# Patient Record
Sex: Male | Born: 1965 | Race: White | Hispanic: No | Marital: Single | Smoking: Never smoker
Health system: Southern US, Community
[De-identification: ages and names within clinical notes are randomized; demographics above are authoritative.]

## PROBLEM LIST (undated history)

## (undated) DIAGNOSIS — K219 Gastro-esophageal reflux disease without esophagitis: Secondary | ICD-10-CM

## (undated) DIAGNOSIS — F329 Major depressive disorder, single episode, unspecified: Secondary | ICD-10-CM

## (undated) DIAGNOSIS — R61 Generalized hyperhidrosis: Secondary | ICD-10-CM

## (undated) DIAGNOSIS — F32A Depression, unspecified: Secondary | ICD-10-CM

## (undated) DIAGNOSIS — F419 Anxiety disorder, unspecified: Secondary | ICD-10-CM

## (undated) DIAGNOSIS — F3181 Bipolar II disorder: Secondary | ICD-10-CM

## (undated) DIAGNOSIS — G20C Parkinsonism, unspecified: Secondary | ICD-10-CM

## (undated) HISTORY — PX: HERNIA REPAIR: SHX51

## (undated) HISTORY — PX: HIP SURGERY: SHX245

## (undated) HISTORY — DX: Parkinsonism, unspecified: G20.C

---

## 2015-11-05 ENCOUNTER — Emergency Department (HOSPITAL_COMMUNITY)
Admission: EM | Admit: 2015-11-05 | Discharge: 2015-11-07 | Disposition: A | Discharge status: Other Acute Inpatient Hospital | Payer: BLUE CROSS/BLUE SHIELD | Attending: Emergency Medicine | Admitting: Emergency Medicine

## 2015-11-05 ENCOUNTER — Encounter (HOSPITAL_COMMUNITY): Payer: Self-pay

## 2015-11-05 DIAGNOSIS — R739 Hyperglycemia, unspecified: Secondary | ICD-10-CM | POA: Diagnosis not present

## 2015-11-05 DIAGNOSIS — R4689 Other symptoms and signs involving appearance and behavior: Secondary | ICD-10-CM

## 2015-11-05 DIAGNOSIS — F332 Major depressive disorder, recurrent severe without psychotic features: Secondary | ICD-10-CM | POA: Diagnosis not present

## 2015-11-05 DIAGNOSIS — R460 Very low level of personal hygiene: Secondary | ICD-10-CM

## 2015-11-05 DIAGNOSIS — F411 Generalized anxiety disorder: Secondary | ICD-10-CM | POA: Diagnosis not present

## 2015-11-05 DIAGNOSIS — Z79899 Other long term (current) drug therapy: Secondary | ICD-10-CM | POA: Insufficient documentation

## 2015-11-05 DIAGNOSIS — R45851 Suicidal ideations: Secondary | ICD-10-CM | POA: Diagnosis not present

## 2015-11-05 DIAGNOSIS — T1491 Suicide attempt: Secondary | ICD-10-CM | POA: Diagnosis present

## 2015-11-05 DIAGNOSIS — Z046 Encounter for general psychiatric examination, requested by authority: Secondary | ICD-10-CM

## 2015-11-05 DIAGNOSIS — E876 Hypokalemia: Secondary | ICD-10-CM | POA: Insufficient documentation

## 2015-11-05 DIAGNOSIS — R4589 Other symptoms and signs involving emotional state: Secondary | ICD-10-CM

## 2015-11-05 HISTORY — DX: Major depressive disorder, single episode, unspecified: F32.9

## 2015-11-05 HISTORY — DX: Depression, unspecified: F32.A

## 2015-11-05 HISTORY — DX: Generalized hyperhidrosis: R61

## 2015-11-05 LAB — CBC
HCT: 44.3 % (ref 39.0–52.0)
Hemoglobin: 15.7 g/dL (ref 13.0–17.0)
MCH: 33 pg (ref 26.0–34.0)
MCHC: 35.4 g/dL (ref 30.0–36.0)
MCV: 93.1 fL (ref 78.0–100.0)
PLATELETS: 317 10*3/uL (ref 150–400)
RBC: 4.76 MIL/uL (ref 4.22–5.81)
RDW: 12.1 % (ref 11.5–15.5)
WBC: 4.6 10*3/uL (ref 4.0–10.5)

## 2015-11-05 LAB — COMPREHENSIVE METABOLIC PANEL
ALK PHOS: 78 U/L (ref 38–126)
ALT: 33 U/L (ref 17–63)
AST: 40 U/L (ref 15–41)
Albumin: 5.4 g/dL — ABNORMAL HIGH (ref 3.5–5.0)
Anion gap: 10 (ref 5–15)
BILIRUBIN TOTAL: 0.8 mg/dL (ref 0.3–1.2)
BUN: 29 mg/dL — AB (ref 6–20)
CALCIUM: 10.2 mg/dL (ref 8.9–10.3)
CHLORIDE: 100 mmol/L — AB (ref 101–111)
CO2: 28 mmol/L (ref 22–32)
CREATININE: 1.15 mg/dL (ref 0.61–1.24)
GFR calc Af Amer: >60 mL/min (ref >60)
Glucose, Bld: 142 mg/dL — ABNORMAL HIGH (ref 65–99)
Potassium: 3.3 mmol/L — ABNORMAL LOW (ref 3.5–5.1)
Sodium: 138 mmol/L (ref 135–145)
TOTAL PROTEIN: 8.1 g/dL (ref 6.5–8.1)

## 2015-11-05 LAB — ACETAMINOPHEN LEVEL: Acetaminophen (Tylenol), Serum: <10 ug/mL — ABNORMAL LOW (ref 10–30)

## 2015-11-05 LAB — ETHANOL

## 2015-11-05 LAB — SALICYLATE LEVEL: Salicylate Lvl: <4 mg/dL (ref 2.8–30.0)

## 2015-11-05 LAB — RAPID URINE DRUG SCREEN, HOSP PERFORMED
AMPHETAMINES: NOT DETECTED
Barbiturates: NOT DETECTED
Benzodiazepines: NOT DETECTED
Cocaine: NOT DETECTED
Opiates: NOT DETECTED
TETRAHYDROCANNABINOL: NOT DETECTED

## 2015-11-05 MED ORDER — LORAZEPAM 1 MG PO TABS: 1.0000 mg | ORAL_TABLET | Freq: Three times a day (TID) | ORAL | Status: DC | PRN

## 2015-11-05 MED ORDER — ADULT MULTIVITAMIN W/MINERALS CH
1.0000 | ORAL_TABLET | Freq: Every day | ORAL | Status: DC
  Administered 2015-11-05 – 2015-11-07 (×3): 1 via ORAL
  Filled 2015-11-05 (×3): qty 1

## 2015-11-05 MED ORDER — FAMOTIDINE 20 MG PO TABS
20.0000 mg | ORAL_TABLET | Freq: Every day | ORAL | Status: DC
  Administered 2015-11-05 – 2015-11-07 (×3): 20 mg via ORAL
  Filled 2015-11-05 (×3): qty 1

## 2015-11-05 MED ORDER — ALUM & MAG HYDROXIDE-SIMETH 200-200-20 MG/5ML PO SUSP: 30.0000 mL | ORAL | Status: DC | PRN

## 2015-11-05 MED ORDER — ONDANSETRON HCL 4 MG PO TABS
4.0000 mg | ORAL_TABLET | Freq: Three times a day (TID) | ORAL | Status: DC | PRN
Start: 2015-11-05 — End: 2015-11-07

## 2015-11-05 MED ORDER — IBUPROFEN 200 MG PO TABS: 600.0000 mg | ORAL_TABLET | Freq: Three times a day (TID) | ORAL | Status: DC | PRN

## 2015-11-05 MED ORDER — PANTOPRAZOLE SODIUM 40 MG PO TBEC
40.0000 mg | DELAYED_RELEASE_TABLET | Freq: Every day | ORAL | Status: DC
  Administered 2015-11-05 – 2015-11-07 (×3): 40 mg via ORAL
  Filled 2015-11-05 (×3): qty 1

## 2015-11-05 MED ORDER — ACETAMINOPHEN 325 MG PO TABS: 650.0000 mg | ORAL_TABLET | ORAL | Status: DC | PRN

## 2015-11-05 MED ORDER — POTASSIUM CHLORIDE CRYS ER 20 MEQ PO TBCR
20.0000 meq | EXTENDED_RELEASE_TABLET | Freq: Once | ORAL | Status: AC
  Administered 2015-11-05: 20 meq via ORAL
  Filled 2015-11-05: qty 1

## 2015-11-05 MED ORDER — VITAMIN D3 25 MCG (1000 UNIT) PO TABS
5000.0000 [IU] | ORAL_TABLET | Freq: Every day | ORAL | Status: DC
  Administered 2015-11-06 – 2015-11-07 (×2): 5000 [IU] via ORAL
  Filled 2015-11-05 (×2): qty 5

## 2015-11-05 MED ORDER — ZOLPIDEM TARTRATE 5 MG PO TABS: 5.0000 mg | ORAL_TABLET | Freq: Every evening | ORAL | Status: DC | PRN

## 2015-11-05 NOTE — ED Notes (Signed)
Patient stated that he did say to his mother that he did threaten to kill his mother and himself, but he did not mean it. Patient stated that his mother said "sometimes you make me so damn mad and I feel like killing you." Patient states his mother and his brother are trying to make it look like he is crazy, but he is not. Patient states when his family makes him mad he does "blow up." Patient stated that he told his mother that ifs he did not hang the phone up when she was dialing 911 that he would kill her. Patient states he said that to scare her. Patient does confirm that he has not left his house in a year. Poor hygiene.

## 2015-11-05 NOTE — ED Notes (Signed)
Patient placed in scrubs, had on blue pattern pj bottoms, navy under wear, gray socks, peach tshirt and bedroom slippers. Items placed in belongings bag and placed behind nursing station. Security wanded patient. He has a black wallet and security has it.

## 2015-11-05 NOTE — ED Notes (Signed)
Pt AAO x 3, no distress noted, calm & cooperative.  Monitoring for safety, Q 15 min checks in effect. 
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
## 2015-11-05 NOTE — ED Notes (Signed)
Key placed in IVC folder, locker 8
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
## 2015-11-06 DIAGNOSIS — F411 Generalized anxiety disorder: Secondary | ICD-10-CM | POA: Diagnosis present

## 2015-11-06 DIAGNOSIS — F332 Major depressive disorder, recurrent severe without psychotic features: Secondary | ICD-10-CM | POA: Diagnosis not present

## 2015-11-06 DIAGNOSIS — R45851 Suicidal ideations: Secondary | ICD-10-CM

## 2015-11-06 MED ORDER — FLUOXETINE HCL 10 MG PO CAPS
10.0000 mg | ORAL_CAPSULE | Freq: Every day | ORAL | Status: DC
  Administered 2015-11-06 – 2015-11-07 (×2): 10 mg via ORAL
  Filled 2015-11-06 (×2): qty 1

## 2015-11-06 MED ORDER — GABAPENTIN 300 MG PO CAPS
300.0000 mg | ORAL_CAPSULE | Freq: Two times a day (BID) | ORAL | Status: DC
  Administered 2015-11-06 – 2015-11-07 (×3): 300 mg via ORAL
  Filled 2015-11-06 (×3): qty 1

## 2015-11-06 MED ORDER — HYDROXYZINE HCL 25 MG PO TABS
25.0000 mg | ORAL_TABLET | Freq: Four times a day (QID) | ORAL | Status: DC | PRN
  Administered 2015-11-06: 25 mg via ORAL
  Filled 2015-11-06: qty 1
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
## 2015-11-06 NOTE — Progress Notes (Signed)
Pt confirmed with ED Cm that his pcp is Gary [name] EPIC updated Pt pleasant and talkative Expressed how much he likes his pcp

## 2015-11-06 NOTE — Progress Notes (Signed)
Per Maris Bergerarol, [facility] accepts patient under Dr. Shawnie DapperLopez 11/07/2015 after 0830. Call report when patient is leaving. [phone]. [place] with TTS made aware.
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
## 2015-11-06 NOTE — ED Notes (Signed)
Patient appears cooperative, mildly anxious. Denies SI, HI, AVH. Reports trouble sleeping at night since being hospitalized. Denies any feelings of anxiety or depression.  Encouragement offered. Snack provided.  Q 15 safety checks continue.
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
## 2015-11-06 NOTE — BH Assessment (Addendum)
[facility] Assessment Progress Note  At the request of Thedore MinsMojeed Akintayo, MD, this writer called pt's mother, Gary [name] [phone]), to discuss pt returning to her household.  Call was placed at 09:56.  Call rolled to voice mail and I left a message.  Awaiting call back.  Gary Canninghomas Lexine Jaspers, MA Triage Specialist [phone]   Addendum:  Second call placed at 10:29, again rolling to voice mail.  Gary Canninghomas Jackye Dever, MA Triage Specialist [phone]   Addendum:  Notes above have been staffed with Dr Jannifer FranklinAkintayo, MD, and it has been decided to seek psychiatric hospitalization for this pt.  This Clinical research associatewriter will seek placement.  Gary Canninghomas Mallorey Odonell, MA Triage Specialist [phone]

## 2015-11-07 NOTE — ED Notes (Signed)
[facility].  Pt denied S/I and H/I at discharge.  All belongings were sent with patient.  Pt was calm and cooperative.

## 2015-11-07 NOTE — BH Assessment (Signed)
[facility] Assessment Progress Note  At 08:52 this Clinical research associatewriter received a call from a friend of Lucent Technologiesancy Herro, pt's mother, indicating that she is making [facility] to [facility] under the presumption that pt is to be discharged.  I indicated that she should call me before coming her.  Please note that Ms Kalmar lives in [place] and would need help with transportation.  Also, pt has signed Consent to Release Information to his mother and no one else.  At 09:25 I called Ms Fenstermacher at [phone].  Call rolled to voice mail and I left a message notifying her that pt is being transferred to [facility] [place].  Doylene Canninghomas Javae Braaten, MA Triage Specialist [phone]

## 2016-02-09 ENCOUNTER — Encounter: Payer: Self-pay | Admitting: Pediatric Intensive Care

## 2016-02-09 DIAGNOSIS — Z5900 Homelessness unspecified: Secondary | ICD-10-CM

## 2016-02-10 ENCOUNTER — Encounter: Payer: Self-pay | Admitting: Pediatric Intensive Care

## 2016-02-10 DIAGNOSIS — Z139 Encounter for screening, unspecified: Secondary | ICD-10-CM

## 2016-02-10 NOTE — Congregational Nurse Program (Signed)
Congregational Nurse Program Note  Date of Encounter: 02/10/2016  Past Medical History: Past Medical History:  Diagnosis Date  . Depression   . Hyperhydrosis disorder     Encounter Details:     CNP Questionnaire - 02/10/16 1015      Patient Demographics   Is this a new or existing patient? Existing   Patient is considered a/an Not Applicable   Race Caucasian/White     Patient Assistance   Location of Patient Assistance [facility]   Patient's financial/insurance status Medicaid   Uninsured Patient No   Patient referred to apply for the following financial assistance Not Applicable   Food insecurities addressed Not Applicable   Transportation assistance No   Assistance securing medications Yes   Type of Assistance Other   Educational health offerings Navigating the healthcare system     Encounter Details   Primary purpose of visit Navigating the Healthcare System   Was an Emergency Department visit averted? Not Applicable   Does patient have a medical provider? Yes   Patient referred to Follow up with established PCP   Was a mental health screening completed? (GAINS tool) No   Does patient have dental issues? No   Does patient have vision issues? No   Does your patient have an abnormal blood pressure today? No   Since previous encounter, have you referred patient for abnormal blood pressure that resulted in a new diagnosis or medication change? No   Does your patient have an abnormal blood glucose today? No   Was there a life-saving intervention made? No     Client states that he was seen by C Evans CN yesterday afternoon and that she is securing his medications. Client speaks tangentially but indicated that he is on several medications. CN spoke with CN Logan BoresEvans- case Pharmacologistmanager [facility] secured [facility] for client. CN left message for case manager that she had seen client this morning. CN notified client that case manager secured medication for him.

## 2016-02-11 DIAGNOSIS — Z5900 Homelessness unspecified: Secondary | ICD-10-CM

## 2016-02-16 NOTE — Congregational Nurse Program (Signed)
Congregational Nurse Program Note  Date of Encounter: 02/09/2016  Past Medical History: Past Medical History:  Diagnosis Date  . Depression   . Hyperhydrosis disorder     Encounter Details:     CNP Questionnaire - 02/10/16 1015      Patient Demographics   Is this a new or existing patient? Existing   Patient is considered a/an Not Applicable   Race Caucasian/White     Patient Assistance   Location of Patient Assistance [facility]   Patient's financial/insurance status Medicaid   Uninsured Patient No   Patient referred to apply for the following financial assistance Not Applicable   Food insecurities addressed Not Applicable   Transportation assistance No   Assistance securing medications Yes   Type of Assistance Other   Educational health offerings Navigating the healthcare system     Encounter Details   Primary purpose of visit Navigating the Healthcare System   Was an Emergency Department visit averted? Not Applicable   Does patient have a medical provider? Yes   Patient referred to Follow up with established PCP   Was a mental health screening completed? (GAINS tool) No   Does patient have dental issues? No   Does patient have vision issues? No   Does your patient have an abnormal blood pressure today? No   Since previous encounter, have you referred patient for abnormal blood pressure that resulted in a new diagnosis or medication change? No   Does your patient have an abnormal blood glucose today? No   Since previous encounter, have you referred patient for abnormal blood glucose that resulted in a new diagnosis or medication change? No   Was there a life-saving intervention made? No     client presents anxious stating he is almost out of medications.  States Child psychotherapistsocial worker, "joy" was supposed to get them for him.  Contacted Sw, [facility] of [facility].  She states meds are ready [facility] pick up [facility] and she was going to get them today.  States [facility] will pay for the co-pays.   States he is "on his mothers insurance"  Informed client

## 2016-02-16 NOTE — Congregational Nurse Program (Signed)
Congregational Nurse Program Note  Date of Encounter: 02/11/2016  Past Medical History: Past Medical History:  Diagnosis Date  . Depression   . Hyperhydrosis disorder     Encounter Details:     CNP Questionnaire - 02/11/16 1024      Patient Demographics   Is this a new or existing patient? Existing   Patient is considered a/an Not Applicable   Race Caucasian/White     Patient Assistance   Location of Patient Assistance [facility]   Patient's financial/insurance status Medicaid   Uninsured Patient No   Patient referred to apply for the following financial assistance Not Applicable   Food insecurities addressed Not Applicable   Transportation assistance No   Assistance securing medications Yes   Type of Assistance Other   Educational health offerings Navigating the healthcare system     Encounter Details   Primary purpose of visit Navigating the Healthcare System   Was an Emergency Department visit averted? Not Applicable   Does patient have a medical provider? Yes   Patient referred to Follow up with established PCP   Was a mental health screening completed? (GAINS tool) No   Does patient have dental issues? No   Does patient have vision issues? No   Does your patient have an abnormal blood pressure today? No   Since previous encounter, have you referred patient for abnormal blood pressure that resulted in a new diagnosis or medication change? No   Does your patient have an abnormal blood glucose today? No   Since previous encounter, have you referred patient for abnormal blood glucose that resulted in a new diagnosis or medication change? No   Was there a life-saving intervention made? No     States still does not have medications.  Attempted to contact sw, Joy.  Could not reach.  [facility] [place].  Pharmacy assistant stated he did not have a payor listed, but that the medications were ready for pick up.  Client stated he had BC/BS and provided card.  [facility] meds  [facility], CN program paid the co-pay for 30 day supply.  Client is a poor historian and there seems to be many gaps in the assessment as to what constituted his homeless and lack of support.  He states he has always lived with his mom who is now 52 years old.  He states he "cannot do for himself" and admits to being very scared.  Contacted CN Nurse [place] and made a case management referral to SW Intern, Evie.  A treatment meeting is scheduled for 9/27 to coordinate care  Meds were obtained and delivered to client

## 2016-03-01 NOTE — Congregational Nurse Program (Signed)
Congregational Nurse Program Note  Date of Encounter: 02/09/2016  Past Medical History: Past Medical History:  Diagnosis Date  . Depression   . Hyperhydrosis disorder     Encounter Details:     CNP Questionnaire - 02/11/16 1024      Patient Demographics   Is this a new or existing patient? Existing   Patient is considered a/an Not Applicable   Race Caucasian/White     Patient Assistance   Location of Patient Assistance [facility]   Patient's financial/insurance status Medicaid   Uninsured Patient No   Patient referred to apply for the following financial assistance Not Applicable   Food insecurities addressed Not Applicable   Transportation assistance No   Assistance securing medications Yes   Type of Assistance Other   Educational health offerings Navigating the healthcare system     Encounter Details   Primary purpose of visit Navigating the Healthcare System   Was an Emergency Department visit averted? Not Applicable   Does patient have a medical provider? Yes   Patient referred to Follow up with established PCP   Was a mental health screening completed? (GAINS tool) No   Does patient have dental issues? No   Does patient have vision issues? No   Does your patient have an abnormal blood pressure today? No   Since previous encounter, have you referred patient for abnormal blood pressure that resulted in a new diagnosis or medication change? No   Does your patient have an abnormal blood glucose today? No   Since previous encounter, have you referred patient for abnormal blood glucose that resulted in a new diagnosis or medication change? No   Was there a life-saving intervention made? No     Client referred to CN Lattie Hawharlotte Evans for behavioral health management after interview. Client is confused and poor historian. He is unable to review medication or health history.

## 2016-03-08 ENCOUNTER — Encounter: Payer: Self-pay | Admitting: Pediatric Intensive Care

## 2016-03-09 ENCOUNTER — Encounter: Payer: Self-pay | Admitting: Pediatric Intensive Care

## 2016-03-26 ENCOUNTER — Encounter: Payer: Self-pay | Admitting: Pediatric Intensive Care

## 2016-03-31 NOTE — Congregational Nurse Program (Signed)
Congregational Nurse Program Note  Date of Encounter: 03/09/2016  Past Medical History: Past Medical History:  Diagnosis Date  . Depression   . Hyperhydrosis disorder     Encounter Details:     CNP Questionnaire - 03/09/16 1142      Patient Demographics   Is this a new or existing patient? Existing   Patient is considered a/an Not Applicable   Race Caucasian/White     Patient Assistance   Location of Patient Assistance [facility]   Patient's financial/insurance status Medicaid   Uninsured Patient ([place] [employer]) No   Patient referred to apply for the following financial assistance Not Applicable   Food insecurities addressed Not Applicable   Transportation assistance No   Assistance securing medications No   Product/process development scientistducational health offerings Navigating the healthcare system;Other     Encounter Details   Primary purpose of visit Education/Health Concerns;Other   Was an Emergency Department visit averted? Not Applicable   Does patient have a medical provider? Yes   Patient referred to Not Applicable   Was a mental health screening completed? (GAINS tool) No   Does patient have dental issues? No   Does patient have vision issues? No   Does your patient have an abnormal blood pressure today? No   Since previous encounter, have you referred patient for abnormal blood pressure that resulted in a new diagnosis or medication change? No   Does your patient have an abnormal blood glucose today? No   Since previous encounter, have you referred patient for abnormal blood glucose that resulted in a new diagnosis or medication change? No   Was there a life-saving intervention made? No     Client in for re-check of red area on left wrist. Site healing well, no drainage or s/s infection. Client requests assistance with setting up medication in pill box. CN reviewed medication and assisted client with filling pill box.

## 2016-03-31 NOTE — Congregational Nurse Program (Signed)
Congregational Nurse Program Note  Date of Encounter: 03/08/2016  Past Medical History: Past Medical History:  Diagnosis Date  . Depression   . Hyperhydrosis disorder     Encounter Details:     CNP Questionnaire - 03/08/16 0930      Patient Demographics   Is this a new or existing patient? Existing   Patient is considered a/an Not Applicable   Race Caucasian/White     Patient Assistance   Location of Patient Assistance [facility]   Patient's financial/insurance status Medicaid   Uninsured Patient ([facility] [employer]) No   Patient referred to apply for the following financial assistance Not Applicable   Food insecurities addressed Not Applicable   Transportation assistance No   Assistance securing medications No   Educational health offerings Acute disease     Encounter Details   Primary purpose of visit Acute Illness/Condition Visit;Education/Health Concerns   Was an Emergency Department visit averted? Not Applicable   Does patient have a medical provider? Yes   Patient referred to Not Applicable   Was a mental health screening completed? (GAINS tool) No   Does patient have dental issues? No   Does patient have vision issues? No   Does your patient have an abnormal blood pressure today? No   Since previous encounter, have you referred patient for abnormal blood pressure that resulted in a new diagnosis or medication change? No   Does your patient have an abnormal blood glucose today? No   Since previous encounter, have you referred patient for abnormal blood glucose that resulted in a new diagnosis or medication change? No   Was there a life-saving intervention made? No     Client has small erythemic area (approx 1 cm) in left wrist. Client states he had "blister there" over the weekend. No drainage noted. CN advised client to wash area gently with soap and water. CN will re-check area as needed.

## 2016-04-06 ENCOUNTER — Encounter: Payer: Self-pay | Admitting: Pediatric Intensive Care

## 2016-04-06 NOTE — Congregational Nurse Program (Signed)
Congregational Nurse Program Note  Date of Encounter: 04/06/2016  Past Medical History: Past Medical History:  Diagnosis Date  . Depression   . Hyperhydrosis disorder     Encounter Details:     CNP Questionnaire - 04/06/16 1145      Patient Demographics   Is this a new or existing patient? Existing   Patient is considered a/an Not Applicable   Race Caucasian/White     Patient Assistance   Location of Patient Assistance [facility]   Patient's financial/insurance status Medicaid   Uninsured Patient ([facility] [employer]) No   Patient referred to apply for the following financial assistance Not Applicable   Food insecurities addressed Not Applicable   Transportation assistance No   Assistance securing medications No   Product/process development scientistducational health offerings Navigating the healthcare system;Medications     Encounter Details   Primary purpose of visit Post PCP Visit;Education/Health Concerns   Was an Emergency Department visit averted? Not Applicable   Does patient have a medical provider? Yes   Patient referred to Not Applicable   Was a mental health screening completed? (GAINS tool) No   Does patient have dental issues? No   Does patient have vision issues? No   Does your patient have an abnormal blood pressure today? No   Since previous encounter, have you referred patient for abnormal blood pressure that resulted in a new diagnosis or medication change? No   Does your patient have an abnormal blood glucose today? No   Since previous encounter, have you referred patient for abnormal blood glucose that resulted in a new diagnosis or medication change? No   Was there a life-saving intervention made? No     Client has completed appointment with PCP. PCP instructions to increase fluoxitine to 40mg  (20mg x2) per day to finish existing prescription then fill prescription for 40mg . Client states that he is comfortable adding medication to pill box. Client will follow up with CN on Friday.

## 2016-04-09 ENCOUNTER — Encounter: Payer: Self-pay | Admitting: Pediatric Intensive Care

## 2016-04-12 ENCOUNTER — Encounter: Payer: Self-pay | Admitting: Pediatric Intensive Care

## 2016-04-23 ENCOUNTER — Encounter: Payer: Self-pay | Admitting: Pediatric Intensive Care

## 2016-04-28 NOTE — Congregational Nurse Program (Signed)
Congregational Nurse Program Note  Date of Encounter: 04/12/2016  Past Medical History: Past Medical History:  Diagnosis Date  . Depression   . Hyperhydrosis disorder     Encounter Details:     CNP Questionnaire - 04/12/16 1000      Patient Demographics   Is this a new or existing patient? Existing   Patient is considered a/an Not Applicable   Race Caucasian/White     Patient Assistance   Location of Patient Assistance [facility]   Patient's financial/insurance status Medicaid   Uninsured Patient ([facility] [employer]) No   Patient referred to apply for the following financial assistance Not Applicable   Food insecurities addressed Not Applicable   Assistance securing medications No   Educational health offerings Acute disease     Encounter Details   Primary purpose of visit Acute Illness/Condition Visit   Was an Emergency Department visit averted? Not Applicable   Does patient have a medical provider? Yes   Patient referred to Follow up with established PCP   Was a mental health screening completed? (GAINS tool) No   Does patient have dental issues? No   Does patient have vision issues? No   Does your patient have an abnormal blood pressure today? No   Since previous encounter, have you referred patient for abnormal blood pressure that resulted in a new diagnosis or medication change? No   Does your patient have an abnormal blood glucose today? No   Since previous encounter, have you referred patient for abnormal blood glucose that resulted in a new diagnosis or medication change? No   Was there a life-saving intervention made? No     Right shin wound unchanged. No drainage noted. Client states no pain at site. Re-check on 11/21.

## 2016-04-28 NOTE — Congregational Nurse Program (Signed)
Congregational Nurse Program Note  Date of Encounter: 03/26/2016  Past Medical History: Past Medical History:  Diagnosis Date  . Depression   . Hyperhydrosis disorder     Encounter Details:     CNP Questionnaire - 04/06/16 1145      Patient Demographics   Is this a new or existing patient? Existing   Patient is considered a/an Not Applicable   Race Caucasian/White     Patient Assistance   Location of Patient Assistance [facility]   Patient's financial/insurance status Medicaid   Uninsured Patient ([facility] [employer]) No   Patient referred to apply for the following financial assistance Not Applicable   Food insecurities addressed Not Applicable   Transportation assistance No   Assistance securing medications No   Product/process development scientistducational health offerings Navigating the healthcare system;Medications     Encounter Details   Primary purpose of visit Post PCP Visit;Education/Health Concerns   Was an Emergency Department visit averted? Not Applicable   Does patient have a medical provider? Yes   Patient referred to Not Applicable   Was a mental health screening completed? (GAINS tool) No   Does patient have dental issues? No   Does patient have vision issues? No   Does your patient have an abnormal blood pressure today? No   Since previous encounter, have you referred patient for abnormal blood pressure that resulted in a new diagnosis or medication change? No   Does your patient have an abnormal blood glucose today? No   Since previous encounter, have you referred patient for abnormal blood glucose that resulted in a new diagnosis or medication change? No   Was there a life-saving intervention made? No     Client in CN office to prepare medications. Client cires at intervals and verbalizes that he doesn't have any control over his emotions. CN offered re-assurance. Client to visit clinic as needed.

## 2016-04-28 NOTE — Congregational Nurse Program (Signed)
Congregational Nurse Program Note  Date of Encounter: 04/09/2016  Past Medical History: Past Medical History:  Diagnosis Date  . Depression   . Hyperhydrosis disorder     Encounter Details:     CNP Questionnaire - 04/09/16 1230      Patient Demographics   Is this a new or existing patient? Existing   Patient is considered a/an Not Applicable   Race Caucasian/White     Patient Assistance   Location of Patient Assistance [facility]   Patient's financial/insurance status Medicaid   Uninsured Patient ([facility] [employer]) No   Patient referred to apply for the following financial assistance Not Applicable   Food insecurities addressed Not Applicable   Transportation assistance No   Assistance securing medications No   Educational health offerings Acute disease     Encounter Details   Primary purpose of visit Acute Illness/Condition Visit   Was an Emergency Department visit averted? Yes   Does patient have a medical provider? Yes   Patient referred to Follow up with established PCP   Was a mental health screening completed? (GAINS tool) No   Does patient have dental issues? No   Does patient have vision issues? No   Does your patient have an abnormal blood pressure today? No   Since previous encounter, have you referred patient for abnormal blood pressure that resulted in a new diagnosis or medication change? No   Does your patient have an abnormal blood glucose today? No   Since previous encounter, have you referred patient for abnormal blood glucose that resulted in a new diagnosis or medication change? No   Was there a life-saving intervention made? No     Client has a 2cm wound on right shin in area of pigmentation. Would is erythemic but there is no drainage and it does no appear infected. Client states that heas has had an "ulcer" in this area before. Client advised to clean area with soap and water daily and let air dry. He will return to clinic on 11/20.

## 2016-05-07 ENCOUNTER — Encounter: Payer: Self-pay | Admitting: Pediatric Intensive Care

## 2016-05-21 ENCOUNTER — Encounter: Payer: Self-pay | Admitting: Pediatric Intensive Care
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
## 2016-05-31 NOTE — Congregational Nurse Program (Signed)
Congregational Nurse Program Note  Date of Encounter: 05/07/2016  Past Medical History: Past Medical History:  Diagnosis Date  . Depression   . Hyperhydrosis disorder     Encounter Details:     CNP Questionnaire - 05/21/16 1100      Patient Demographics   Is this a new or existing patient? Existing   Patient is considered a/an Not Applicable   Race Caucasian/White     Patient Assistance   Location of Patient Assistance [facility]   Patient's financial/insurance status Self-Pay (Uninsured)   Uninsured Patient ([employer] officer, trade unionCard/Care Connects) Yes   Interventions Not Applicable   Patient referred to apply for the following financial assistance Medicaid   Food insecurities addressed Not Applicable   Transportation assistance No   Assistance securing medications No   Educational health offerings Interpersonal relationships;Medications;Navigating the healthcare system     Encounter Details   Primary purpose of visit Chronic Illness/Condition Visit;Safety;Spiritual Care/Support Visit   Was an Emergency Department visit averted? Not Applicable   Does patient have a medical provider? Yes   Patient referred to Not Applicable   Was a mental health screening completed? (GAINS tool) No   Does patient have dental issues? No   Does patient have vision issues? No   Does your patient have an abnormal blood pressure today? No   Since previous encounter, have you referred patient for abnormal blood pressure that resulted in a new diagnosis or medication change? No   Does your patient have an abnormal blood glucose today? No   Since previous encounter, have you referred patient for abnormal blood glucose that resulted in a new diagnosis or medication change? No   Was there a life-saving intervention made? No     Client in for medication set up. States that his back has been hurting- probably from his bed. Client tearful at intervals and talking about his family. Support given.

## 2016-05-31 NOTE — Congregational Nurse Program (Signed)
Congregational Nurse Program Note  Date of Encounter: 04/23/2016  Past Medical History: Past Medical History:  Diagnosis Date  . Depression   . Hyperhydrosis disorder     Encounter Details:     CNP Questionnaire - 05/07/16 0930      Patient Demographics   Is this a new or existing patient? Existing   Patient is considered a/an Not Applicable   Race Caucasian/White     Patient Assistance   Location of Patient Assistance [facility]   Patient's financial/insurance status Private Insurance Coverage   Uninsured Patient ([facility]/[employer]s) No   Interventions Not Applicable   Patient referred to apply for the following financial assistance Not Applicable   Food insecurities addressed Not Applicable   Transportation assistance No   Assistance securing medications No   Educational health offerings Interpersonal relationships;Hypertension     Encounter Details   Primary purpose of visit Acute Illness/Condition Visit;Spiritual Care/Support Visit   Was an Emergency Department visit averted? Not Applicable   Does patient have a medical provider? Yes   Patient referred to Not Applicable   Was a mental health screening completed? (GAINS tool) No   Does patient have dental issues? No   Does patient have vision issues? No   Does your patient have an abnormal blood pressure today? No   Since previous encounter, have you referred patient for abnormal blood pressure that resulted in a new diagnosis or medication change? No   Does your patient have an abnormal blood glucose today? No   Since previous encounter, have you referred patient for abnormal blood glucose that resulted in a new diagnosis or medication change? No   Was there a life-saving intervention made? No     Client in for medication set up, review. States he continues to have crying episodes. Affect is very labile. CN offered support and client will follow up in office next week.

## 2016-06-11 ENCOUNTER — Encounter: Payer: Self-pay | Admitting: Pediatric Intensive Care

## 2016-06-18 MED FILL — FLUoxetine HCL 40 MG CAPS: 40 | 30 days supply | Qty: 30 | Fill #0

## 2016-06-22 ENCOUNTER — Encounter: Payer: Self-pay | Admitting: Pediatric Intensive Care

## 2016-06-25 ENCOUNTER — Encounter: Payer: Self-pay | Admitting: Pediatric Intensive Care

## 2016-07-09 ENCOUNTER — Encounter: Payer: Self-pay | Admitting: Pediatric Intensive Care
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
## 2016-07-10 NOTE — Congregational Nurse Program (Signed)
Congregational Nurse Program Note  Date of Encounter: 06/22/2016  Past Medical History: Past Medical History:  Diagnosis Date  . Depression   . Hyperhydrosis disorder     Encounter Details:     CNP Questionnaire - 06/30/16 1116      Patient Demographics   Is this a new or existing patient? Existing   Patient is considered a/an Not Applicable   Race Caucasian/White     Patient Assistance   Location of Patient Assistance [facility]   Patient's financial/insurance status Self-Pay (Uninsured)   Uninsured Patient ([employer] officer, trade unionCard/Care Connects) Yes   Patient referred to apply for the following financial assistance Medicaid   Food insecurities addressed Not Applicable   Transportation assistance No   Assistance securing medications No   Educational health offerings Interpersonal relationships;Behavioral health     Encounter Details   Primary purpose of visit Chronic Illness/Condition Visit;Safety;Spiritual Care/Support Visit   Was an Emergency Department visit averted? Not Applicable   Does patient have a medical provider? Yes   Patient referred to Not Applicable   Was a mental health screening completed? (GAINS tool) No   Does patient have dental issues? No   Does patient have vision issues? No   Does your patient have an abnormal blood pressure today? No   Since previous encounter, have you referred patient for abnormal blood pressure that resulted in a new diagnosis or medication change? No   Does your patient have an abnormal blood glucose today? No   Since previous encounter, have you referred patient for abnormal blood glucose that resulted in a new diagnosis or medication change? No   Was there a life-saving intervention made? No     [facility]. States that Np want him to check BP at clinic. Cleint needs gabpentin and prozac refills. Will coordinate with [facility].

## 2016-07-10 NOTE — Congregational Nurse Program (Signed)
Congregational Nurse Program Note  Date of Encounter: 06/11/2016  Past Medical History: Past Medical History:  Diagnosis Date  . Depression   . Hyperhydrosis disorder     Encounter Details:     CNP Questionnaire - 06/30/16 1116      Patient Demographics   Is this a new or existing patient? Existing   Patient is considered a/an Not Applicable   Race Caucasian/White     Patient Assistance   Location of Patient Assistance [facility]   Patient's financial/insurance status Self-Pay (Uninsured)   Uninsured Patient ([employer] officer, trade unionCard/Care Connects) Yes   Patient referred to apply for the following financial assistance Medicaid   Food insecurities addressed Not Applicable   Transportation assistance No   Assistance securing medications No   Educational health offerings Interpersonal relationships;Behavioral health     Encounter Details   Primary purpose of visit Chronic Illness/Condition Visit;Safety;Spiritual Care/Support Visit   Was an Emergency Department visit averted? Not Applicable   Does patient have a medical provider? Yes   Patient referred to Not Applicable   Was a mental health screening completed? (GAINS tool) No   Does patient have dental issues? No   Does patient have vision issues? No   Does your patient have an abnormal blood pressure today? No   Since previous encounter, have you referred patient for abnormal blood pressure that resulted in a new diagnosis or medication change? No   Does your patient have an abnormal blood glucose today? No   Since previous encounter, have you referred patient for abnormal blood glucose that resulted in a new diagnosis or medication change? No   Was there a life-saving intervention made? No     Ass[facility] client with [facility] Client will go on Monday 06/14/2016.

## 2016-07-22 NOTE — Congregational Nurse Program (Signed)
Congregational Nurse Program Note  Date of Encounter: 06/25/2016  Past Medical History: Past Medical History:  Diagnosis Date  . Depression   . Hyperhydrosis disorder     Encounter Details:     CNP Questionnaire - 06/30/16 1116      Patient Demographics   Is this a new or existing patient? Existing   Patient is considered a/an Not Applicable   Race Caucasian/White     Patient Assistance   Location of Patient Assistance [facility]   Patient's financial/insurance status Self-Pay (Uninsured)   Uninsured Patient ([employer] officer, trade unionCard/Care Connects) Yes   Patient referred to apply for the following financial assistance Medicaid   Food insecurities addressed Not Applicable   Transportation assistance No   Assistance securing medications No   Educational health offerings Interpersonal relationships;Behavioral health     Encounter Details   Primary purpose of visit Chronic Illness/Condition Visit;Safety;Spiritual Care/Support Visit   Was an Emergency Department visit averted? Not Applicable   Does patient have a medical provider? Yes   Patient referred to Not Applicable   Was a mental health screening completed? (GAINS tool) No   Does patient have dental issues? No   Does patient have vision issues? No   Does your patient have an abnormal blood pressure today? No   Since previous encounter, have you referred patient for abnormal blood pressure that resulted in a new diagnosis or medication change? No   Does your patient have an abnormal blood glucose today? No   Since previous encounter, have you referred patient for abnormal blood glucose that resulted in a new diagnosis or medication change? No   Was there a life-saving intervention made? No     BP check

## 2016-07-23 ENCOUNTER — Encounter: Payer: Self-pay | Admitting: Pediatric Intensive Care

## 2016-07-25 NOTE — Congregational Nurse Program (Signed)
Congregational Nurse Program Note  Date of Encounter: 07/09/2016  Past Medical History: Past Medical History:  Diagnosis Date  . Depression   . Hyperhydrosis disorder     Encounter Details:     CNP Questionnaire - 07/09/16 1100      Patient Demographics   Is this a new or existing patient? Existing   Patient is considered a/an Not Applicable   Race Caucasian/White     Patient Assistance   Location of Patient Assistance [facility]   Patient's financial/insurance status Self-Pay (Uninsured);Low Income   Uninsured Patient ([place] Card/Care Connects) Yes   Interventions Assisted patient in making appt.   Patient referred to apply for the following financial assistance Medicaid   Food insecurities addressed Not Applicable   Transportation assistance Yes   Type of Assistance SCAT   Assistance securing medications Yes   Type of Assistance Other   Educational health offerings Interpersonal relationships;Medications     Encounter Details   Primary purpose of visit Navigating the Healthcare System;Education/Health Concerns   Was an Emergency Department visit averted? Not Applicable   Does patient have a medical provider? Yes   Patient referred to Follow up with established PCP   Was a mental health screening completed? (GAINS tool) No   Does patient have dental issues? No   Does patient have vision issues? No   Does your patient have an abnormal blood pressure today? No   Since previous encounter, have you referred patient for abnormal blood pressure that resulted in a new diagnosis or medication change? No   Does your patient have an abnormal blood glucose today? No   Since previous encounter, have you referred patient for abnormal blood glucose that resulted in a new diagnosis or medication change? No   Was there a life-saving intervention made? No    BP and medication check. Client is low on gabapentin and fluoxitine. CN assisted client [facility]  [facility]. Web designerCommunity health worker will go with client. Client's Medicaid application is pending.

## 2016-07-26 NOTE — Congregational Nurse Program (Signed)
Congregational Nurse Program Note  Date of Encounter: 08/09/2016  Past Medical History: Past Medical History:  Diagnosis Date  . Depression   . Hyperhydrosis disorder     Encounter Details:     CNP Questionnaire - 07/12/16 0915      Patient Demographics   Is this a new or existing patient? Existing   Patient is considered a/an Not Applicable   Race Caucasian/White     Patient Assistance   Location of Patient Assistance [facility]   Patient's financial/insurance status Self-Pay (Uninsured);Low Income   Uninsured Patient ([employer] Card/Care Connects) Yes   Interventions Assisted patient in making appt.   Patient referred to apply for the following financial assistance Medicaid   Food insecurities addressed Not Applicable   Transportation assistance Yes   Type of Assistance Bus Pass Given   Assistance securing medications No   Educational health offerings Interpersonal relationships;Hypertension;Navigating the healthcare system     Encounter Details   Primary purpose of visit Education/Health Concerns;Navigating the Healthcare System   Was an Emergency Department visit averted? Not Applicable   Does patient have a medical provider? Yes   Patient referred to Follow up with established PCP   Was a mental health screening completed? (GAINS tool) No   Does patient have dental issues? No   Does patient have vision issues? No   Does your patient have an abnormal blood pressure today? No   Since previous encounter, have you referred patient for abnormal blood pressure that resulted in a new diagnosis or medication change? No   Does your patient have an abnormal blood glucose today? No   Since previous encounter, have you referred patient for abnormal blood glucose that resulted in a new diagnosis or medication change? No   Was there a life-saving intervention made? No     BP check

## 2016-08-02 ENCOUNTER — Encounter: Payer: Self-pay | Admitting: Pediatric Intensive Care

## 2016-08-09 ENCOUNTER — Encounter: Payer: Self-pay | Admitting: Pediatric Intensive Care
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
## 2016-08-26 NOTE — Congregational Nurse Program (Unsigned)
Congregational Nurse Program Note  Date of Encounter: 07/23/2016  Past Medical History: Past Medical History:  Diagnosis Date  . Depression   . Hyperhydrosis disorder     Encounter Details:     CNP Questionnaire - 08/09/16 0928      Patient Demographics   Is this a new or existing patient? Existing   Patient is considered a/an Not Applicable   Race Caucasian/White     Patient Assistance   Location of Patient Assistance [facility]   Patient's financial/insurance status Self-Pay (Uninsured);Low Income   Uninsured Patient ([place] Card/Care Connects) Yes   Interventions Assisted patient in making appt.   Patient referred to apply for the following financial assistance Medicaid   Food insecurities addressed Not Applicable   Transportation assistance Yes   Type of Assistance Bus Pass Given   Assistance securing medications No   Educational health offerings Interpersonal relationships;Hypertension;Navigating the healthcare system     Encounter Details   Primary purpose of visit Education/Health Concerns;Navigating the Healthcare System   Was an Emergency Department visit averted? Not Applicable   Does patient have a medical provider? Yes   Patient referred to Follow up with established PCP   Was a mental health screening completed? (GAINS tool) No   Does patient have dental issues? No   Does patient have vision issues? No   Does your patient have an abnormal blood pressure today? No   Since previous encounter, have you referred patient for abnormal blood pressure that resulted in a new diagnosis or medication change? No   Does your patient have an abnormal blood glucose today? No   Since previous encounter, have you referred patient for abnormal blood glucose that resulted in a new diagnosis or medication change? No   Was there a life-saving intervention made? No     BP check.

## 2016-08-27 ENCOUNTER — Encounter: Payer: Self-pay | Admitting: Pediatric Intensive Care

## 2016-08-27 NOTE — Congregational Nurse Program (Signed)
Congregational Nurse Program Note  Date of Encounter: 08/11/2016  Past Medical History: Past Medical History:  Diagnosis Date  . Depression   . Hyperhydrosis disorder     Encounter Details:     CNP Questionnaire - 08/11/16 1031      Patient Demographics   Is this a new or existing patient? Existing   Patient is considered a/an Not Applicable   Race Caucasian/White     Patient Assistance   Location of Patient Assistance [facility]   Patient's financial/insurance status Self-Pay (Uninsured);Low Income   Uninsured Patient ([place] Card/Care Connects) Yes   Interventions Assisted patient in making appt.   Patient referred to apply for the following financial assistance Medicaid   Food insecurities addressed Not Applicable   Transportation assistance No   Assistance securing medications No   Type of Assistance Other   Educational health offerings Interpersonal relationships;Hypertension;Navigating the healthcare system     Encounter Details   Primary purpose of visit Education/Health Concerns;Navigating the Healthcare System   Was an Emergency Department visit averted? Not Applicable   Does patient have a medical provider? Yes   Patient referred to Follow up with established PCP   Was a mental health screening completed? (GAINS tool) No   Does patient have dental issues? No   Does patient have vision issues? No   Does your patient have an abnormal blood pressure today? No   Since previous encounter, have you referred patient for abnormal blood pressure that resulted in a new diagnosis or medication change? No   Does your patient have an abnormal blood glucose today? No   Since previous encounter, have you referred patient for abnormal blood glucose that resulted in a new diagnosis or medication change? No   Was there a life-saving intervention made? No     client c/o "sore" left foot.  Notice a lesion (corn) on outer side of Left foot, Shoes are not fitting properly creating  friction.  Padding placed for comfort.  Encouraged client to return to [employer] to obtain shoes that fit properly

## 2016-08-29 NOTE — Congregational Nurse Program (Signed)
Congregational Nurse Program Note  Date of Encounter: 08/02/2016  Past Medical History: Past Medical History:  Diagnosis Date  . Depression   . Hyperhydrosis disorder     Encounter Details:     CNP Questionnaire - 08/11/16 1031      Patient Demographics   Is this a new or existing patient? Existing   Patient is considered a/an Not Applicable   Race Caucasian/White     Patient Assistance   Location of Patient Assistance [facility]   Patient's financial/insurance status Self-Pay (Uninsured);Low Income   Uninsured Patient ([place] Card/Care Connects) Yes   Interventions Assisted patient in making appt.   Patient referred to apply for the following financial assistance Medicaid   Food insecurities addressed Not Applicable   Transportation assistance No   Assistance securing medications No   Type of Assistance Other   Educational health offerings Interpersonal relationships;Hypertension;Navigating the healthcare system     Encounter Details   Primary purpose of visit Education/Health Concerns;Navigating the Healthcare System   Was an Emergency Department visit averted? Not Applicable   Does patient have a medical provider? Yes   Patient referred to Follow up with established PCP   Was a mental health screening completed? (GAINS tool) No   Does patient have dental issues? No   Does patient have vision issues? No   Does your patient have an abnormal blood pressure today? No   Since previous encounter, have you referred patient for abnormal blood pressure that resulted in a new diagnosis or medication change? No   Does your patient have an abnormal blood glucose today? No   Since previous encounter, have you referred patient for abnormal blood glucose that resulted in a new diagnosis or medication change? No   Was there a life-saving intervention made? No     BP check

## 2016-09-06 ENCOUNTER — Encounter: Payer: Self-pay | Admitting: Pediatric Intensive Care

## 2016-09-10 NOTE — Congregational Nurse Program (Signed)
Congregational Nurse Program Note  Date of Encounter: 08/30/2016  Past Medical History: Past Medical History:  Diagnosis Date  . Depression   . Hyperhydrosis disorder     Encounter Details:     CNP Questionnaire - 09/10/16 1128      Patient Demographics   Is this a new or existing patient? Existing   Patient is considered a/an Not Applicable   Race Caucasian/White     Patient Assistance   Location of Patient Assistance [facility]   Patient's financial/insurance status Self-Pay (Uninsured);Low Income   Uninsured Patient ([place] Card/Care Connects) Yes   Interventions Assisted patient in making appt.   Patient referred to apply for the following financial assistance Medicaid   Food insecurities addressed Not Applicable   Transportation assistance No   Type of Assistance --   Assistance securing medications No   Type of Assistance Other   Educational health offerings Interpersonal relationships;Hypertension;Navigating the healthcare system     Encounter Details   Primary purpose of visit Education/Health Concerns;Navigating the Healthcare System   Was an Emergency Department visit averted? Not Applicable   Does patient have a medical provider? Yes   Patient referred to Follow up with established PCP   Was a mental health screening completed? (GAINS tool) No   Does patient have dental issues? No   Does patient have vision issues? No   Does your patient have an abnormal blood pressure today? No   Since previous encounter, have you referred patient for abnormal blood pressure that resulted in a new diagnosis or medication change? No   Does your patient have an abnormal blood glucose today? No   Since previous encounter, have you referred patient for abnormal blood glucose that resulted in a new diagnosis or medication change? No   Was there a life-saving intervention made? No     Client "checking in".  States is foot is much better.  Working with the Web designer to  develop living skills

## 2016-09-13 ENCOUNTER — Encounter: Payer: Self-pay | Admitting: Pediatric Intensive Care

## 2016-09-13 NOTE — Congregational Nurse Program (Signed)
Congregational Nurse Program Note  Date of Encounter: 08/27/2016  Past Medical History: Past Medical History:  Diagnosis Date  . Depression   . Hyperhydrosis disorder     Encounter Details:     CNP Questionnaire - 09/10/16 1128      Patient Demographics   Is this a new or existing patient? Existing   Patient is considered a/an Not Applicable   Race Caucasian/White     Patient Assistance   Location of Patient Assistance [facility]   Patient's financial/insurance status Self-Pay (Uninsured);Low Income   Uninsured Patient ([place] Card/Care Connects) Yes   Interventions Assisted patient in making appt.   Patient referred to apply for the following financial assistance Medicaid   Food insecurities addressed Not Applicable   Transportation assistance No   Type of Assistance --   Assistance securing medications No   Type of Assistance Other   Educational health offerings Interpersonal relationships;Hypertension;Navigating the healthcare system     Encounter Details   Primary purpose of visit Education/Health Concerns;Navigating the Healthcare System   Was an Emergency Department visit averted? Not Applicable   Does patient have a medical provider? Yes   Patient referred to Follow up with established PCP   Was a mental health screening completed? (GAINS tool) No   Does patient have dental issues? No   Does patient have vision issues? No   Does your patient have an abnormal blood pressure today? No   Since previous encounter, have you referred patient for abnormal blood pressure that resulted in a new diagnosis or medication change? No   Does your patient have an abnormal blood glucose today? No   Since previous encounter, have you referred patient for abnormal blood glucose that resulted in a new diagnosis or medication change? No   Was there a life-saving intervention made? No     BP check. Needs refills from [facility].

## 2016-09-20 ENCOUNTER — Encounter: Payer: Self-pay | Admitting: Pediatric Intensive Care

## 2016-09-20 NOTE — Congregational Nurse Program (Signed)
Congregational Nurse Program Note  Date of Encounter: 09/13/2016  Past Medical History: Past Medical History:  Diagnosis Date  . Depression   . Hyperhydrosis disorder     Encounter Details:     CNP Questionnaire - 09/20/16 1130      Patient Demographics   Is this a new or existing patient? Existing   Patient is considered a/an Not Applicable   Race Caucasian/White     Patient Assistance   Location of Patient Assistance [facility]   Patient's financial/insurance status Self-Pay (Uninsured);Low Income   Uninsured Patient ([place] Card/Care Connects) Yes   Interventions Not Applicable   Patient referred to apply for the following financial assistance Not Applicable   Food insecurities addressed Not Applicable   Transportation assistance No   Type of Assistance Other   Assistance securing medications Yes   Type of Holiday representative health offerings Medications;Navigating the healthcare system     Encounter Details   Primary purpose of visit Navigating the Healthcare System   Was an Emergency Department visit averted? Not Applicable   Does patient have a medical provider? Yes   Patient referred to Follow up with established PCP   Was a mental health screening completed? (GAINS tool) No   Does patient have dental issues? No   Does patient have vision issues? No   Does your patient have an abnormal blood pressure today? No   Since previous encounter, have you referred patient for abnormal blood pressure that resulted in a new diagnosis or medication change? No   Does your patient have an abnormal blood glucose today? No   Since previous encounter, have you referred patient for abnormal blood glucose that resulted in a new diagnosis or medication change? No   Was there a life-saving intervention made? No     BP check. Client has disability hearing tomorrow.

## 2016-09-20 NOTE — Congregational Nurse Program (Signed)
Congregational Nurse Program Note  Date of Encounter: 09/20/2016  Past Medical History: Past Medical History:  Diagnosis Date  . Depression   . Hyperhydrosis disorder     Encounter Details:     CNP Questionnaire - 09/20/16 1130      Patient Demographics   Is this a new or existing patient? Existing   Patient is considered a/an Not Applicable   Race Caucasian/White     Patient Assistance   Location of Patient Assistance [facility]   Patient's financial/insurance status Self-Pay (Uninsured);Low Income   Uninsured Patient ([place] Card/Care Connects) Yes   Interventions Not Applicable   Patient referred to apply for the following financial assistance Not Applicable   Food insecurities addressed Not Applicable   Transportation assistance No   Type of Assistance Other   Assistance securing medications Yes   Type of Holiday representative health offerings Medications;Navigating the healthcare system     Encounter Details   Primary purpose of visit Navigating the Healthcare System   Was an Emergency Department visit averted? Not Applicable   Does patient have a medical provider? Yes   Patient referred to Follow up with established PCP   Was a mental health screening completed? (GAINS tool) No   Does patient have dental issues? No   Does patient have vision issues? No   Does your patient have an abnormal blood pressure today? No   Since previous encounter, have you referred patient for abnormal blood pressure that resulted in a new diagnosis or medication change? No   Does your patient have an abnormal blood glucose today? No   Since previous encounter, have you referred patient for abnormal blood glucose that resulted in a new diagnosis or medication change? No   Was there a life-saving intervention made? No     Client is concerned that he is running low on fluoxetine  prescription. CN called [facility]. They will deliver new prescription on  Friday.

## 2016-09-27 NOTE — Congregational Nurse Program (Signed)
Congregational Nurse Program Note  Date of Encounter: 09/06/2016  Past Medical History: Past Medical History:  Diagnosis Date  . Depression   . Hyperhydrosis disorder     Encounter Details:     CNP Questionnaire - 09/20/16 1130      Patient Demographics   Is this a new or existing patient? Existing   Patient is considered a/an Not Applicable   Race Caucasian/White     Patient Assistance   Location of Patient Assistance [facility]   Patient's financial/insurance status Self-Pay (Uninsured);Low Income   Uninsured Patient ([place] Card/Care Connects) Yes   Interventions Not Applicable   Patient referred to apply for the following financial assistance Not Applicable   Food insecurities addressed Not Applicable   Transportation assistance No   Type of Assistance Other   Assistance securing medications Yes   Type of Holiday representativeAssistance [facility]   Educational health offerings Medications;Navigating the healthcare system     Encounter Details   Primary purpose of visit Navigating the Healthcare System   Was an Emergency Department visit averted? Not Applicable   Does patient have a medical provider? Yes   Patient referred to Follow up with established PCP   Was a mental health screening completed? (GAINS tool) No   Does patient have dental issues? No   Does patient have vision issues? No   Does your patient have an abnormal blood pressure today? No   Since previous encounter, have you referred patient for abnormal blood pressure that resulted in a new diagnosis or medication change? No   Does your patient have an abnormal blood glucose today? No   Since previous encounter, have you referred patient for abnormal blood glucose that resulted in a new diagnosis or medication change? No   Was there a life-saving intervention made? No     [facility]. CN [facility] Hilaria OtaDon Miller to [facility]  medication.

## 2016-10-01 ENCOUNTER — Encounter: Payer: Self-pay | Admitting: Pediatric Intensive Care

## 2016-10-04 ENCOUNTER — Encounter: Payer: Self-pay | Admitting: Pediatric Intensive Care

## 2016-10-08 ENCOUNTER — Encounter: Payer: Self-pay | Admitting: Pediatric Intensive Care

## 2016-10-22 ENCOUNTER — Encounter: Payer: Self-pay | Admitting: Pediatric Intensive Care

## 2016-11-02 NOTE — Congregational Nurse Program (Signed)
Congregational Nurse Program Note  Date of Encounter: 10/04/2016  Past Medical History: Past Medical History:  Diagnosis Date  . Depression   . Hyperhydrosis disorder     Encounter Details:     CNP Questionnaire - 10/04/16 0945      Patient Demographics   Is this a new or existing patient? Existing   Patient is considered a/an Not Applicable   Race Caucasian/White     Patient Assistance   Location of Patient Assistance [facility]   Patient's financial/insurance status Self-Pay (Uninsured);Low Income   Uninsured Patient ([employer] officer, trade [employer]Card/Care Connects) Yes   Interventions Not Applicable   Patient referred to apply for the following financial assistance Not Applicable   Food insecurities addressed Not Applicable   Transportation assistance No   Type of Assistance Other   Assistance securing medications No   Type of Assistance Other   Educational health offerings Hypertension     Encounter Details   Primary purpose of visit Chronic Illness/Condition Visit   Was an Emergency Department visit averted? Not Applicable   Does patient have a medical provider? Yes   Patient referred to Follow up with established PCP   Was a mental health screening completed? (GAINS tool) No   Does patient have dental issues? No   Does patient have vision issues? No   Does your patient have an abnormal blood pressure today? No   Since previous encounter, have you referred patient for abnormal blood pressure that resulted in a new diagnosis or medication change? No   Does your patient have an abnormal blood glucose today? No   Since previous encounter, have you referred patient for abnormal blood glucose that resulted in a new diagnosis or medication change? No   Was there a life-saving intervention made? No     BP check. Rash is resolved.

## 2016-11-02 NOTE — Congregational Nurse Program (Signed)
Congregational Nurse Program Note  Date of Encounter: 10/01/2016  Past Medical History: Past Medical History:  Diagnosis Date  . Depression   . Hyperhydrosis disorder     Encounter Details: Client has small erythemic (1cm x 1 cm) dry patch under right eye above cheek bone. Client is concerned that area is infected. CN assured client that area is not infected. CN advised client not to touch or scratch area and to follow up in clinic on Monday.

## 2016-11-03 NOTE — Congregational Nurse Program (Signed)
Congregational Nurse Program Note  Date of Encounter: 10/08/2016  Past Medical History: Past Medical History:  Diagnosis Date  . Depression   . Hyperhydrosis disorder     Encounter Details:     CNP Questionnaire - 10/08/16 1130      Patient Demographics   Is this a new or existing patient? Existing   Patient is considered a/an Not Applicable   Race Caucasian/White     Patient Assistance   Location of Patient Assistance [facility]   Patient's financial/insurance status Medicaid;Low Income   Uninsured Patient ([employer] officer, trade unionCard/Care Connects) No   Interventions Appt. has been completed   Patient referred to apply for the following financial assistance Not Applicable   Food insecurities addressed Not Applicable   Transportation assistance Yes   Type of Assistance [employer] Given   Assistance securing medications Yes   Type of Assistance [facility]   Educational health offerings Interpersonal relationships;Medications;Navigating the healthcare system     Encounter Details   Primary purpose of visit Chronic Illness/Condition Visit;Navigating the Healthcare System;Post PCP Visit   Was an Emergency Department visit averted? Not Applicable   Does patient have a medical provider? Yes   Patient referred to Follow up with established PCP   Was a mental health screening completed? (GAINS tool) No   Does patient have dental issues? No   Does patient have vision issues? No   Does your patient have an abnormal blood pressure today? No   Since previous encounter, have you referred patient for abnormal blood pressure that resulted in a new diagnosis or medication change? No   Since previous encounter, have you referred patient for abnormal blood glucose that resulted in a new diagnosis or medication change? No   Was there a life-saving intervention made? No    BP check- client now has Medicaid. CN suggested going back [facility] and client agrees. CN  will call DSS to change provider on Medicaid card.

## 2016-11-09 ENCOUNTER — Encounter: Payer: Self-pay | Admitting: Pediatric Intensive Care

## 2016-11-10 NOTE — Congregational Nurse Program (Signed)
Congregational Nurse Program Note  Date of Encounter: 11/08/2016  Past Medical History: Past Medical History:  Diagnosis Date  . Depression   . Hyperhydrosis disorder     Encounter Details:     CNP Questionnaire - 11/08/16 1506      Patient Demographics   Is this a new or existing patient? Existing   Patient is considered a/an Not Applicable   Race Caucasian/White     Patient Assistance   Location of Patient Assistance [facility]   Patient's financial/insurance status Medicaid;Low Income   Uninsured Patient ([employer] officer, trade unionCard/Care Connects) No   Patient referred to apply for the following financial assistance Not Applicable   Food insecurities addressed Not Applicable   Transportation assistance Yes   Assistance securing medications Yes   Educational health offerings Interpersonal relationships;Medications;Navigating the healthcare system     Encounter Details   Primary purpose of visit Chronic Illness/Condition Visit;Navigating the Healthcare System;Post PCP Visit   Was an Emergency Department visit averted? Not Applicable   Does patient have a medical provider? Yes   Patient referred to Not Applicable   Was a mental health screening completed? (GAINS tool) No   Does patient have dental issues? No   Does patient have vision issues? No   Does your patient have an abnormal blood pressure today? No   Since previous encounter, have you referred patient for abnormal blood pressure that resulted in a new diagnosis or medication change? No   Does your patient have an abnormal blood glucose today? No   Since previous encounter, have you referred patient for abnormal blood glucose that resulted in a new diagnosis or medication change? No   Was there a life-saving intervention made? No     Client ruminating and crying (without tears) about his brother and mother abandoning him.  Expressed terror at being "kicked out" of the shelter.  States "I cannot leave here, I am not ready".  Client has  been in this shelter for almost a year and all extensions have expired.  Email to case manager to meet and discuss case to determine a plan.  Client would be at extreme risk if living on the streets

## 2016-11-22 ENCOUNTER — Encounter: Payer: Self-pay | Admitting: Pediatric Intensive Care

## 2016-11-22 NOTE — Congregational Nurse Program (Signed)
Congregational Nurse Program Note  Date of Encounter: 10/22/2016  Past Medical History: Past Medical History:  Diagnosis Date  . Depression   . Hyperhydrosis disorder     Encounter Details:     CNP Questionnaire - 11/08/16 1506      Patient Demographics   Is this a new or existing patient? Existing   Patient is considered a/an Not Applicable   Race Caucasian/White     Patient Assistance   Location of Patient Assistance [facility]   Patient's financial/insurance status Medicaid;Low Income   Uninsured Patient ([employer] officer, trade unionCard/Care Connects) No   Patient referred to apply for the following financial assistance Not Applicable   Food insecurities addressed Not Applicable   Transportation assistance Yes   Assistance securing medications Yes   Educational health offerings Interpersonal relationships;Medications;Navigating the healthcare system     Encounter Details   Primary purpose of visit Chronic Illness/Condition Visit;Navigating the Healthcare System;Post PCP Visit   Was an Emergency Department visit averted? Not Applicable   Does patient have a medical provider? Yes   Patient referred to Not Applicable   Was a mental health screening completed? (GAINS tool) No   Does patient have dental issues? No   Does patient have vision issues? No   Does your patient have an abnormal blood pressure today? No   Since previous encounter, have you referred patient for abnormal blood pressure that resulted in a new diagnosis or medication change? No   Does your patient have an abnormal blood glucose today? No   Since previous encounter, have you referred patient for abnormal blood glucose that resulted in a new diagnosis or medication change? No   Was there a life-saving intervention made? No     BP check. Client called DSS with CN to change Medicaid provider to [facility] as client has been a patient there previously.

## 2016-11-26 ENCOUNTER — Encounter: Payer: Self-pay | Admitting: Pediatric Intensive Care

## 2016-11-29 NOTE — Congregational Nurse Program (Signed)
Congregational Nurse Program Note  Date of Encounter: 11/09/2016  Past Medical History: Past Medical History:  Diagnosis Date  . Depression   . Hyperhydrosis disorder     Encounter Details:     CNP Questionnaire - 11/09/16 1030      Patient Demographics   Is this a new or existing patient? Existing   Patient is considered a/an Not Applicable   Race Caucasian/White     Patient Assistance   Location of Patient Assistance [facility]   Patient's financial/insurance status Medicaid;Low Income   Uninsured Patient ([employer] Research officer, trade unionCard/Care Connects) No   Interventions Not Applicable   Patient referred to apply for the following financial assistance Not Applicable   Food insecurities addressed Not Applicable   Transportation assistance No   Type of Assistance Other   Assistance securing medications No   Type of Assistance Other   Educational health offerings Interpersonal relationships     Encounter Details   Primary purpose of visit Spiritual Care/Support Visit   Was an Emergency Department visit averted? Not Applicable   Does patient have a medical provider? Yes   Patient referred to Follow up with established PCP   Was a mental health screening completed? (GAINS tool) No   Does patient have dental issues? No   Does patient have vision issues? No   Does your patient have an abnormal blood pressure today? No   Since previous encounter, have you referred patient for abnormal blood pressure that resulted in a new diagnosis or medication change? No   Does your patient have an abnormal blood glucose today? No   Since previous encounter, have you referred patient for abnormal blood glucose that resulted in a new diagnosis or medication change? No   Was there a life-saving intervention made? No    Client expressing concerns that he will be exited from the shelter. Client frequently brings up his family separation and relationship with his brother. Client reassured that he can stay at shelter for  now but that he needs to [facility].

## 2016-12-10 ENCOUNTER — Encounter: Payer: Self-pay | Admitting: Pediatric Intensive Care

## 2016-12-10 ENCOUNTER — Emergency Department (HOSPITAL_COMMUNITY): Payer: Medicaid Other

## 2016-12-10 ENCOUNTER — Emergency Department (HOSPITAL_COMMUNITY)
Admission: EM | Admit: 2016-12-10 | Discharge: 2016-12-10 | Disposition: A | Discharge status: Home / Self Care | Payer: Medicaid Other | Attending: Emergency Medicine | Admitting: Emergency Medicine

## 2016-12-10 ENCOUNTER — Encounter (HOSPITAL_COMMUNITY): Payer: Self-pay | Admitting: Emergency Medicine

## 2016-12-10 DIAGNOSIS — J189 Pneumonia, unspecified organism: Secondary | ICD-10-CM

## 2016-12-10 DIAGNOSIS — Z79899 Other long term (current) drug therapy: Secondary | ICD-10-CM | POA: Diagnosis not present

## 2016-12-10 DIAGNOSIS — R300 Dysuria: Secondary | ICD-10-CM | POA: Diagnosis not present

## 2016-12-10 DIAGNOSIS — J181 Lobar pneumonia, unspecified organism: Secondary | ICD-10-CM | POA: Diagnosis not present

## 2016-12-10 DIAGNOSIS — R05 Cough: Secondary | ICD-10-CM | POA: Diagnosis present

## 2016-12-10 HISTORY — DX: Anxiety disorder, unspecified: F41.9

## 2016-12-10 LAB — URINALYSIS, ROUTINE W REFLEX MICROSCOPIC
Bilirubin Urine: NEGATIVE
GLUCOSE, UA: NEGATIVE mg/dL
KETONES UR: 20 mg/dL — AB
Leukocytes, UA: NEGATIVE
Nitrite: NEGATIVE
PROTEIN: 30 mg/dL — AB
SQUAMOUS EPITHELIAL / LPF: NONE SEEN
Specific Gravity, Urine: 1.018 (ref 1.005–1.030)
pH: 5 (ref 5.0–8.0)

## 2016-12-10 IMAGING — CR DG CHEST 2V
2 series · 2 of 2 positions shown · non-contrast
Comparison: None.

CLINICAL DATA: Worsening cough

EXAM:
CHEST  2 VIEW

[w chest pa]
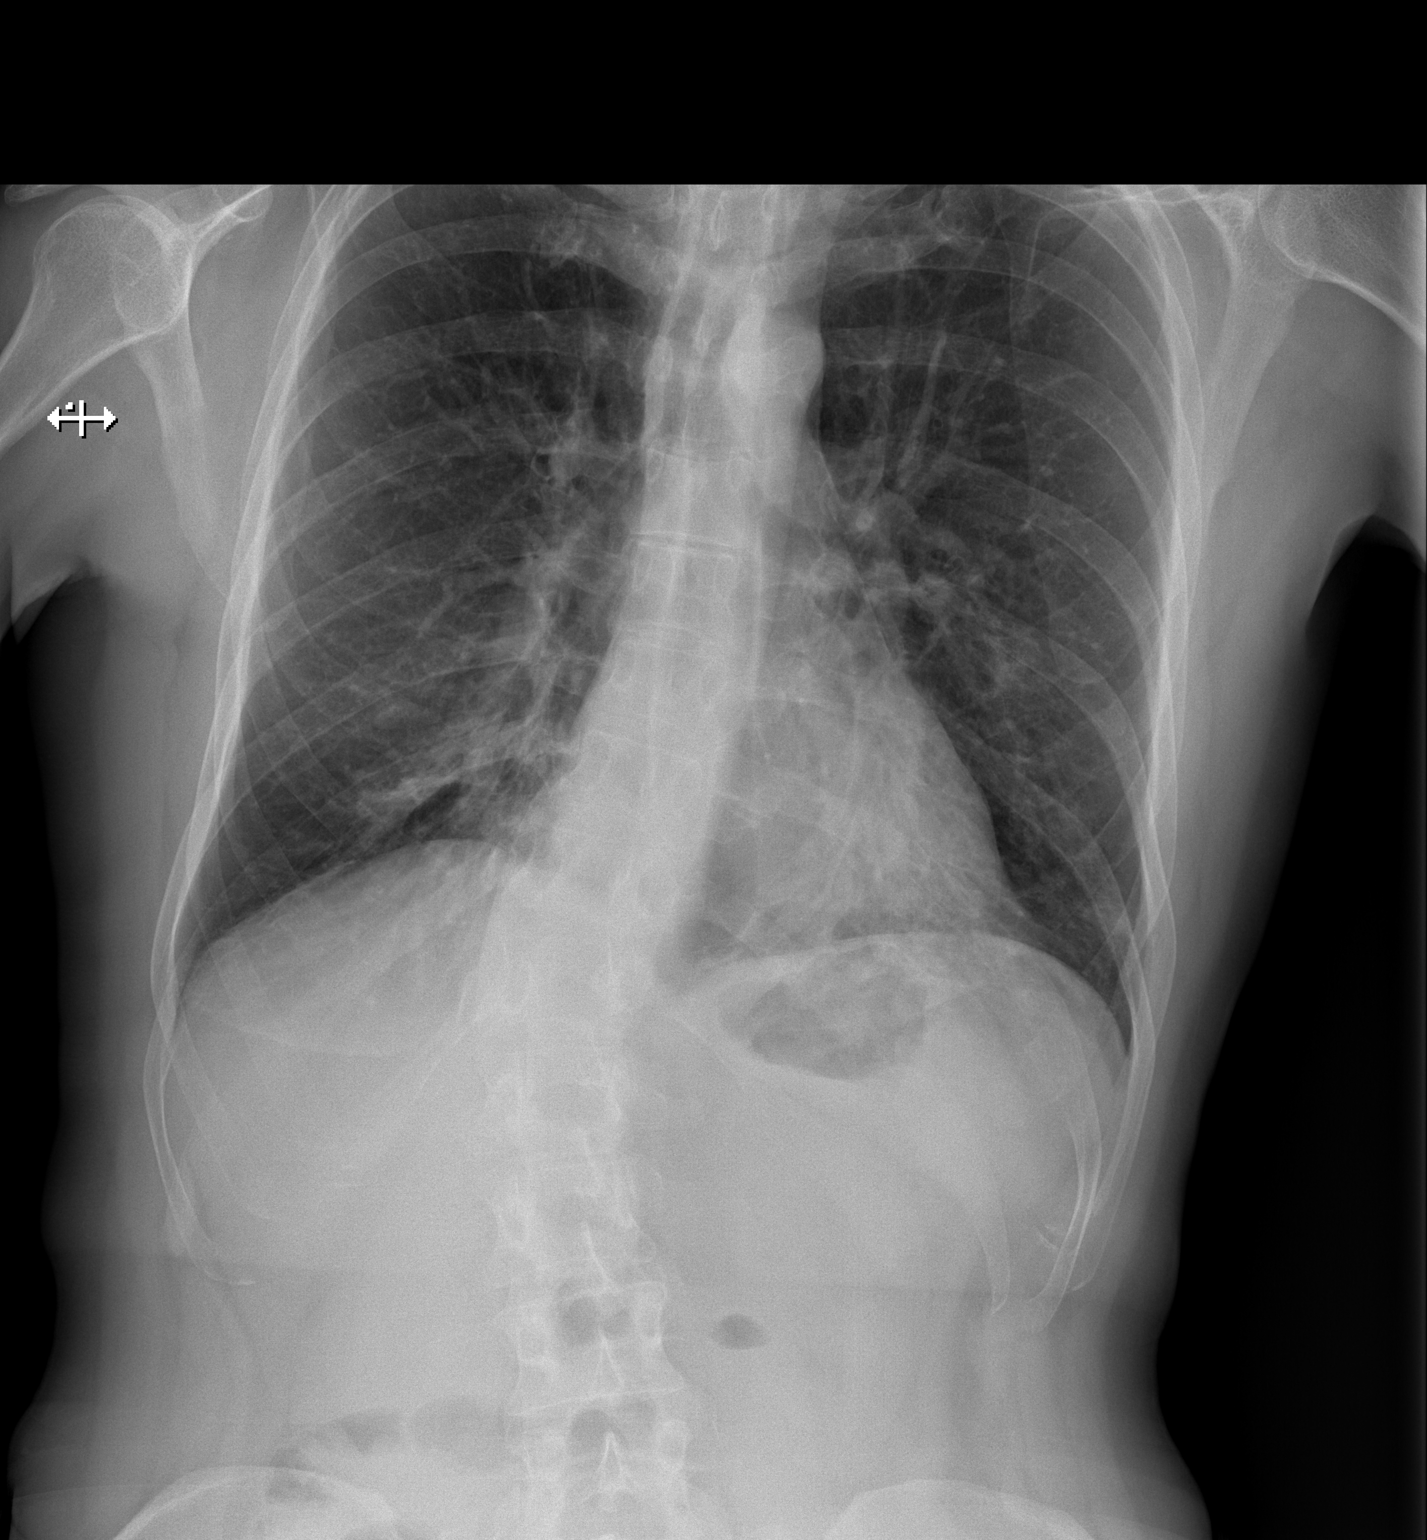

[w chest lat]
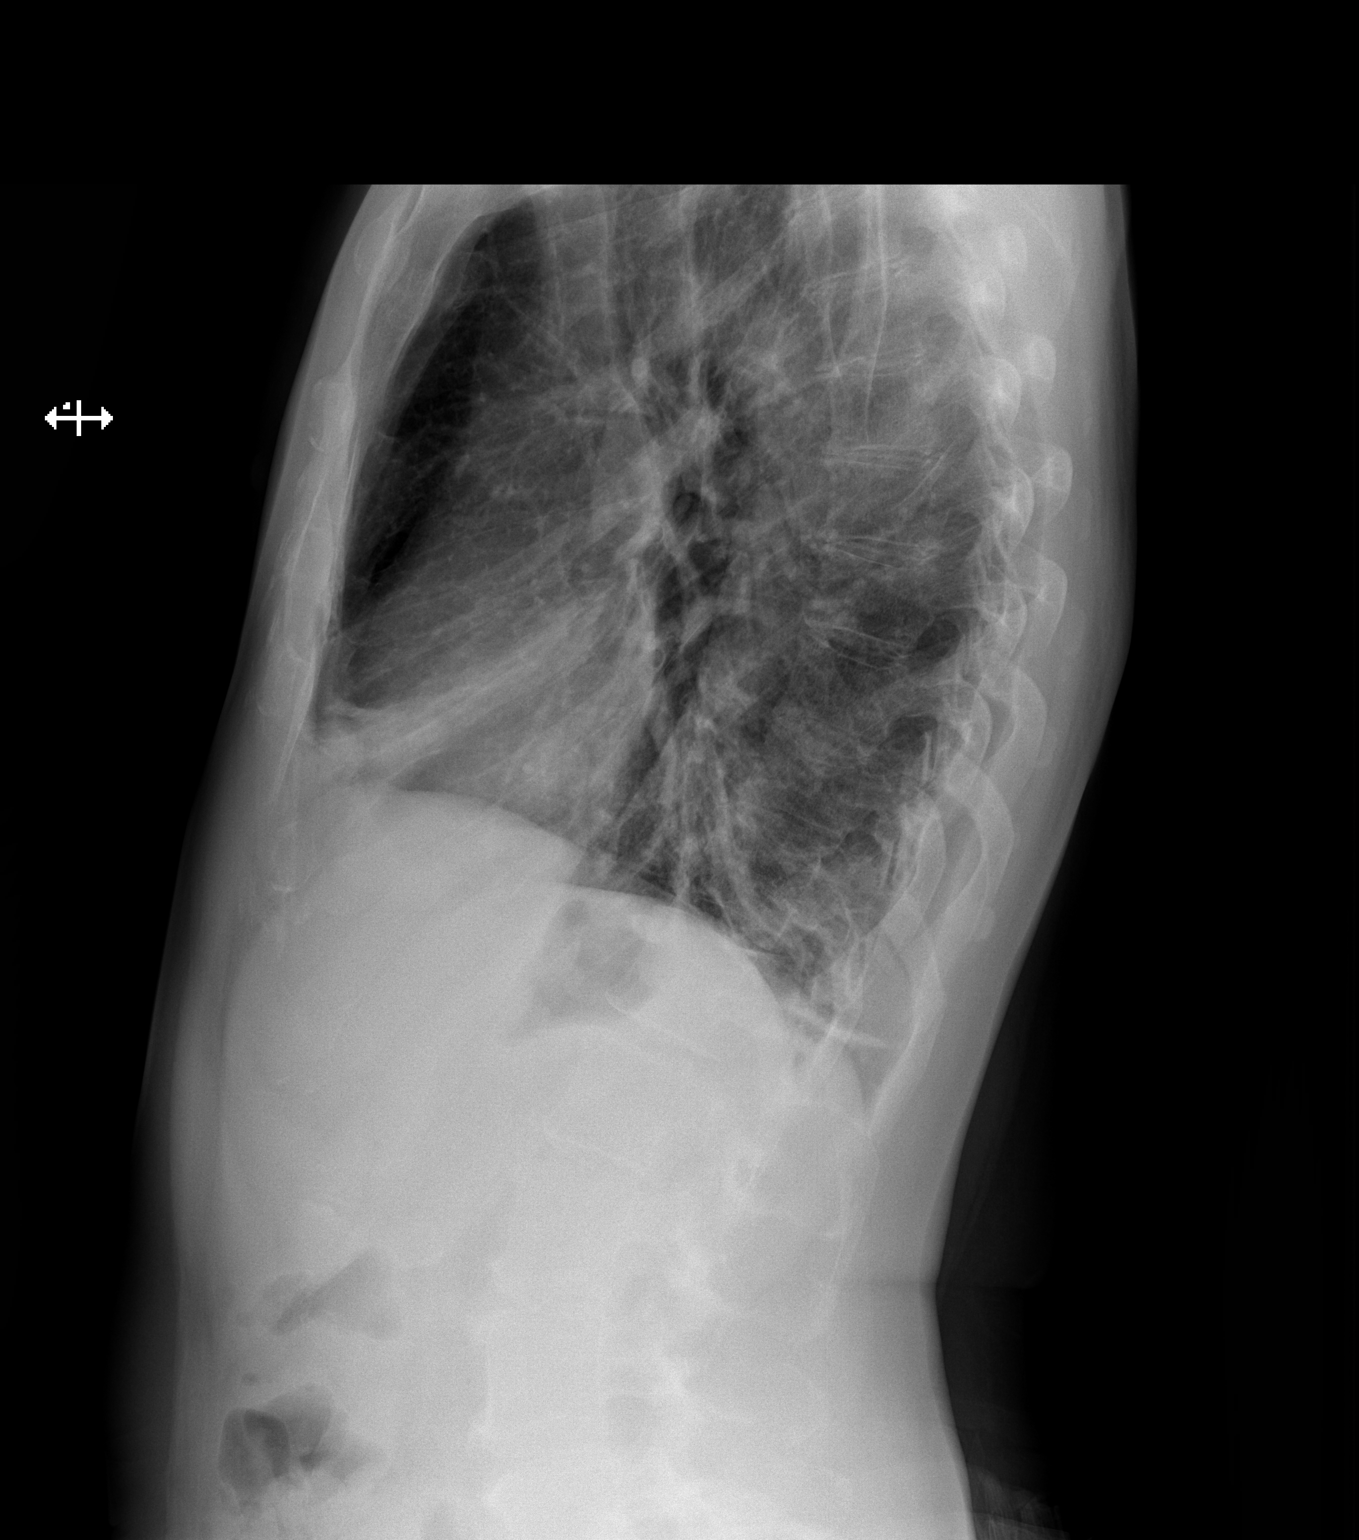

[2 of 2 positions shown; findings below may reference images not displayed]

FINDINGS: Cardiac shadow is within normal limits. Patchy right basilar
infiltrate is noted projecting in the right middle lobe. No sizable
effusion is seen. No acute bony abnormality is noted. Mild scoliotic
curvature at the thoracolumbar junction is noted.
IMPRESSION: Right middle lobe infiltrate.

## 2016-12-10 MED ORDER — CEPHALEXIN 500 MG PO CAPS
500.0000 mg | ORAL_CAPSULE | Freq: Once | ORAL | Status: AC
  Administered 2016-12-10: 500 mg via ORAL
  Filled 2016-12-10: qty 1

## 2016-12-10 MED ORDER — DOXYCYCLINE HYCLATE 100 MG PO CAPS: 100.0000 mg | ORAL_CAPSULE | Freq: Two times a day (BID) | ORAL | 0 refills | Status: AC

## 2016-12-10 MED ORDER — CEPHALEXIN 500 MG PO CAPS: 500.0000 mg | ORAL_CAPSULE | Freq: Two times a day (BID) | ORAL | 0 refills | Status: AC

## 2016-12-10 MED ORDER — DOXYCYCLINE HYCLATE 100 MG PO TABS
100.0000 mg | ORAL_TABLET | Freq: Once | ORAL | Status: AC
  Administered 2016-12-10: 100 mg via ORAL
  Filled 2016-12-10: qty 1

## 2016-12-10 MED ORDER — BENZONATATE 100 MG PO CAPS: 100.0000 mg | ORAL_CAPSULE | Freq: Three times a day (TID) | ORAL | 0 refills | Status: DC

## 2016-12-10 MED FILL — BENZONATATE 100 MG CAP: 100 | 7 days supply | Qty: 21 | Fill #0

## 2016-12-10 MED FILL — DOXYCYCLINE HYCLATE 100 MG: 100 | 5 days supply | Qty: 10 | Fill #0

## 2016-12-10 MED FILL — CEPHALEXIN 500 MG CAPSULE: 500 | 7 days supply | Qty: 14 | Fill #0

## 2016-12-10 NOTE — Congregational Nurse Program (Signed)
Congregational Nurse Program Note  Date of Encounter: 12/10/2016  Past Medical History: Past Medical History:  Diagnosis Date  . Anxiety   . Depression   . Hyperhydrosis disorder     Encounter Details:     CNP Questionnaire - 12/10/16 0930      Patient Demographics   Is this a new or existing patient? Existing   Patient is considered a/an Not Applicable   Race Caucasian/White     Patient Assistance   Location of Patient Assistance [facility]   Patient's financial/insurance status Low Income;Medicaid   Uninsured Patient ([employer]ange Research officer, trade unionCard/Care Connects) No   Patient referred to apply for the following financial assistance Not Applicable   Food insecurities addressed Not Applicable   Transportation assistance Yes   Type of Assistance Taxi Voucher Given   Assistance securing medications No   Educational health offerings Acute disease     Encounter Details   Primary purpose of visit Acute Illness/Condition Visit   Was an Emergency Department visit averted? No   Does patient have a medical provider? Yes   Patient referred to Emergency Department   Was a mental health screening completed? (GAINS tool) No   Does patient have dental issues? No   Does patient have vision issues? No   Does your patient have an abnormal blood pressure today? No   Since previous encounter, have you referred patient for abnormal blood pressure that resulted in a new diagnosis or medication change? No   Does your patient have an abnormal blood glucose today? No   Since previous encounter, have you referred patient for abnormal blood glucose that resulted in a new diagnosis or medication change? No   Was there a life-saving intervention made? No    Client presents to clinic- complains of history of cough, headache, fever/chills and malaise. Symptoms started on Saturday. Client has had sick contacts at shelter. Client states cough is non-productive. Client is pale, diaphoretic. BBS- clear but decreased on right  base. Client unable take take deep breath. CN advises going to ED and client will go via taxi.

## 2016-12-10 NOTE — Discharge Instructions (Signed)
There is evidence of possible pneumonia in the right lung. Be sure to stay well-hydrated and get plenty of rest. May use the Tessalon as needed for cough.  He also have evidence of bacteria in the urine that may be causing the urinary discomfort. Follow up with the urologist on this matter. Call the number provided to set up an appointment.  Please take all of your antibiotics until finished!   You may develop abdominal discomfort or diarrhea from the antibiotic.  You may help offset this with probiotics which you can buy or get in yogurt. Do not eat or take the probiotics until 2 hours after your antibiotic.   Follow up with your primary care provider soon as possible.

## 2016-12-10 NOTE — ED Triage Notes (Addendum)
Pt states that he has had cough x 6 days. Dry cough. Pt also requests a refill on his prozac. Flat affect. Alert and oriented.

## 2016-12-10 NOTE — ED Provider Notes (Signed)
[facility]-EMERGENCY DEPT Provider Note   CSN: [id number] Arrival date & time: 12/10/16  1007   By signing my name below, I, Gary [name], attest that this documentation has been prepared under the direction and in the presence of Gary Arney, PA-[name]. Electronically Signed: Clarisse GougeXavier [name], Scribe. 12/10/16. 11:48 AM.   History   Chief Complaint Chief Complaint  Patient presents with  . Cough   The history is provided by the patient and medical records. No language interpreter was used.    Gary MariscalKevin G Caddell is a 51 y.o. male with h/o depression presenting to the Emergency Department concerning persistent dry cough x 1 week. Associated diaphoresis and chills. Pt states talking a lot and deep breathing aggravate his cough. No tobacco or illicit drug use.   Pt also [name]/o difficulty urinating x 7 months. Associated dysuria "recently." He states his PCP told him to drink cranberry juice when informed about this issue. No hematuria. No known prostate disorders.  Pt [name]/o difficulty attaining and maintaining erections after beginning a course of recently prescribed prozac.  Denies N/V/D, abdominal pain, flank pain, penile discharge, pain with BMs, shortness of breath, CP, or any other complaints.   Past Medical History:  Diagnosis Date  . Anxiety   . Depression   . Hyperhydrosis disorder     Patient Active Problem List   Diagnosis Date Noted  . Severe recurrent major depression without psychotic features (HCC) 11/06/2015  . GAD (generalized anxiety disorder) 11/06/2015    Past Surgical History:  Procedure Laterality Date  . HERNIA REPAIR         Home Medications    Prior to Admission medications   Medication Sig Start Date End Date Taking? Authorizing Provider  benztropine (COGENTIN) 1 MG tablet Take 1 mg by mouth 2 (two) times daily.   Yes [provider]  Cholecalciferol (VITAMIN D3) 5000 units TABS Take 1 tablet by mouth daily.   Yes [provider]  dicyclomine  (BENTYL) 20 MG tablet Take 20 mg by mouth every 6 (six) hours as needed (stomach issues).   Yes [provider]  divalproex (DEPAKOTE ER) 250 MG 24 hr tablet Take 250 mg by mouth 2 (two) times daily.   Yes [provider]  famotidine (PEPCID) 20 MG tablet Take 20 mg by mouth daily.   Yes [provider]  FLUoxetine (PROZAC) 40 MG capsule Take 40 mg by mouth daily.   Yes [provider]  gabapentin (NEURONTIN) 300 MG capsule Take 300 mg by mouth 3 (three) times daily.   Yes [provider]  Multiple Vitamin (MULTIVITAMIN WITH MINERALS) TABS tablet Take 1 tablet by mouth daily.   Yes [provider]  Omega-3 Fatty Acids (FISH OIL PO) Take 1 tablet by mouth daily.   Yes [provider]  pantoprazole (PROTONIX) 40 MG tablet Take 40 mg by mouth daily.   Yes [provider]  benzonatate (TESSALON) 100 MG capsule Take 1 capsule (100 mg total) by mouth every 8 (eight) hours. 12/10/16   Gary Sorg C, PA-[name]  cephALEXin (KEFLEX) 500 MG capsule Take 1 capsule (500 mg total) by mouth 2 (two) times daily. 12/10/16 12/17/16  Gary [name], Gary BloomerShawn C, PA-[name]  doxycycline (VIBRAMYCIN) 100 MG capsule Take 1 capsule (100 mg total) by mouth 2 (two) times daily. 12/10/16 12/15/16  Gary [name], Gary [name] C, PA-[name]    Family History No family history on file.  Social History Social History  Substance Use Topics  . Smoking status: Never Smoker  .  Smokeless tobacco: Never Used  . Alcohol use No     Allergies   Bee venom and Erythromycin   Review of Systems Review of Systems  Constitutional: Positive for chills and diaphoresis. Negative for fever.  Respiratory: Positive for cough. Negative for shortness of breath.   Cardiovascular: Negative for chest pain.  Gastrointestinal: Negative for abdominal pain, diarrhea, nausea and vomiting.  Genitourinary: Positive for dysuria. Negative for discharge, flank pain, hematuria and penile pain.  Musculoskeletal: Negative for back  pain and neck pain.  Skin: Negative for rash.  Neurological: Negative for dizziness, syncope, weakness, light-headedness and numbness.  All other systems reviewed and are negative.    Physical Exam Updated Vital Signs BP (!) 144/94 (BP Location: Right Arm)   Pulse 93   Temp 98.5 F (36.9 [name]) (Oral)   Resp 16   Ht 5\' 3"  (1.6 m)   Wt 123 lb 6.4 oz (56 kg)   SpO2 96%   BMI 21.86 kg/m   Physical Exam  Constitutional: He appears well-developed and well-nourished. No distress.  HENT:  Head: Normocephalic and atraumatic.  Mouth/Throat: Oropharynx is clear and moist.  Eyes: Conjunctivae are normal.  Neck: Normal range of motion. Neck supple.  Cardiovascular: Normal rate, regular rhythm, normal heart sounds and intact distal pulses.   Pulmonary/Chest: Effort normal and breath sounds normal. No respiratory distress.  Patient speaks in full sentences without difficulty. No increased work of breathing.  Abdominal: Soft. There is no tenderness. There is no guarding.  Genitourinary:  Genitourinary Comments: Penis, scrotum, and testicles without swelling, lesions, or tenderness. No penile discharge.  No inguinal hernia noted. Cremasteric reflex intact. Otherwise normal male genitalia. Scribe, Jess Barters, served as Biomedical engineer during the exam.  Musculoskeletal: He exhibits no edema.  Lymphadenopathy:    He has no cervical adenopathy.       Right: No inguinal adenopathy present.       Left: No inguinal adenopathy present.  Neurological: He is alert.  Skin: Skin is warm and dry. He is not diaphoretic.  Psychiatric: He has a normal mood and affect. His behavior is normal.  Nursing note and vitals reviewed.    ED Treatments / Results  DIAGNOSTIC STUDIES: Oxygen Saturation is 96% on RA, NL by my interpretation.    COORDINATION OF CARE: 11:29 AM-Discussed next steps with pt. Pt verbalized understanding and is agreeable with the plan. Pt offered prostate exam, which he denies during evaluation.  Will order UA.   Labs (all labs ordered are listed, but only abnormal results are displayed) Labs Reviewed  URINALYSIS, ROUTINE W REFLEX MICROSCOPIC - Abnormal; Notable for the following:       Result Value   APPearance HAZY (*)    Hgb urine dipstick SMALL (*)    Ketones, ur 20 (*)    Protein, ur 30 (*)    Bacteria, UA MANY (*)    All other components within normal limits  URINE CULTURE  GC/CHLAMYDIA PROBE AMP ([place]) NOT AT [facility]    EKG  EKG Interpretation None       Radiology Dg Chest 2 View  Result Date: 12/10/2016 CLINICAL DATA:  Worsening cough EXAM: CHEST  2 VIEW COMPARISON:  None. FINDINGS: Cardiac shadow is within normal limits. Patchy right basilar infiltrate is noted projecting in the right middle lobe. No sizable effusion is seen. No acute bony abnormality is noted. Mild scoliotic curvature at the thoracolumbar junction is noted. IMPRESSION: Right middle lobe infiltrate. Electronically Signed   By: Alcide Clever  M.D.   On: 12/10/2016 12:16    Procedures Procedures (including critical care time)  Medications Ordered in ED Medications  cephALEXin (KEFLEX) capsule 500 mg (500 mg Oral Given 12/10/16 1248)  doxycycline (VIBRA-TABS) tablet 100 mg (100 mg Oral Given 12/10/16 1248)     Initial Impression / Assessment and Plan / ED Course  I have reviewed the triage vital signs and the nursing notes.  Pertinent labs & imaging results that were available during my care of the patient were reviewed by me and considered in my medical decision making (see chart for details).     Patient presents with complaints of a cough as well as pain with urination. Patient is nontoxic appearing, afebrile, not tachycardic, not tachypneic, not hypotensive, maintains adequate SPO2 on room air, and is in no apparent distress. Doubt sepsis. Infiltrate noted on x-ray. Possible UTI. Suspect erectile dysfunction is due to patient's Prozac. PCP and urology follow-up. The patient was given  instructions for home care as well as return precautions. Patient voices understanding of these instructions, accepts the plan, and is comfortable with discharge.  Patient was offered a prostate exam with explanation, but declined stating he will get this done by his PCP.   Vitals:   12/10/16 1016 12/10/16 1250  BP: (!) 144/94 (!) 138/96  Pulse: 93 88  Resp: 16 18  Temp: 98.5 F (36.9 [name])   TempSrc: Oral   SpO2: 96% 95%  Weight: 56 kg (123 lb 6.4 oz)   Height: 5\' 3"  (1.6 m)      Final Clinical Impressions(s) / ED Diagnoses   Final diagnoses:  Community acquired pneumonia of right middle lobe of lung (HCC)  Dysuria    New Prescriptions Discharge Medication List as of 12/10/2016 12:39 PM    START taking these medications   Details  benzonatate (TESSALON) 100 MG capsule Take 1 capsule (100 mg total) by mouth every 8 (eight) hours., Starting Fri 12/10/2016, Print    cephALEXin (KEFLEX) 500 MG capsule Take 1 capsule (500 mg total) by mouth 2 (two) times daily., Starting Fri 12/10/2016, Until Fri 12/17/2016, Print    doxycycline (VIBRAMYCIN) 100 MG capsule Take 1 capsule (100 mg total) by mouth 2 (two) times daily., Starting Fri 12/10/2016, Until Wed 12/15/2016, Print      I personally performed the services described in this documentation, which was scribed in my presence. The recorded information has been reviewed and is accurate.   Gary Pancoast, PA-[name] 12/10/16 2225    Arby Barrette, MD 12/11/16 1101

## 2016-12-11 ENCOUNTER — Emergency Department (HOSPITAL_COMMUNITY): Payer: Medicaid Other

## 2016-12-11 ENCOUNTER — Emergency Department (HOSPITAL_COMMUNITY)
Admission: EM | Admit: 2016-12-11 | Discharge: 2016-12-12 | Disposition: A | Discharge status: Home / Self Care | Payer: Medicaid Other | Attending: Emergency Medicine | Admitting: Emergency Medicine

## 2016-12-11 ENCOUNTER — Encounter (HOSPITAL_COMMUNITY): Payer: Self-pay

## 2016-12-11 DIAGNOSIS — E860 Dehydration: Secondary | ICD-10-CM | POA: Insufficient documentation

## 2016-12-11 DIAGNOSIS — J189 Pneumonia, unspecified organism: Secondary | ICD-10-CM | POA: Diagnosis present

## 2016-12-11 LAB — COMPREHENSIVE METABOLIC PANEL
ALBUMIN: 3.1 g/dL — AB (ref 3.5–5.0)
ALK PHOS: 116 U/L (ref 38–126)
ALT: 52 U/L (ref 17–63)
AST: 36 U/L (ref 15–41)
Anion gap: 9 (ref 5–15)
BILIRUBIN TOTAL: 0.7 mg/dL (ref 0.3–1.2)
BUN: 11 mg/dL (ref 6–20)
CALCIUM: 9 mg/dL (ref 8.9–10.3)
CO2: 29 mmol/L (ref 22–32)
Chloride: 91 mmol/L — ABNORMAL LOW (ref 101–111)
Creatinine, Ser: 0.89 mg/dL (ref 0.61–1.24)
GFR calc Af Amer: >60 mL/min (ref >60)
GLUCOSE: 113 mg/dL — AB (ref 65–99)
Potassium: 3.4 mmol/L — ABNORMAL LOW (ref 3.5–5.1)
Sodium: 129 mmol/L — ABNORMAL LOW (ref 135–145)
TOTAL PROTEIN: 6.6 g/dL (ref 6.5–8.1)

## 2016-12-11 LAB — CBC WITH DIFFERENTIAL/PLATELET
BASOS ABS: 0 10*3/uL (ref 0.0–0.1)
BASOS PCT: 0 %
Eosinophils Absolute: 0.1 10*3/uL (ref 0.0–0.7)
Eosinophils Relative: 1 %
HEMATOCRIT: 38.2 % — AB (ref 39.0–52.0)
HEMOGLOBIN: 13 g/dL (ref 13.0–17.0)
LYMPHS PCT: 6 %
Lymphs Abs: 0.4 10*3/uL — ABNORMAL LOW (ref 0.7–4.0)
MCH: 31.1 pg (ref 26.0–34.0)
MCHC: 34 g/dL (ref 30.0–36.0)
MCV: 91.4 fL (ref 78.0–100.0)
MONOS PCT: 8 %
Monocytes Absolute: 0.5 10*3/uL (ref 0.1–1.0)
NEUTROS ABS: 5.3 10*3/uL (ref 1.7–7.7)
NEUTROS PCT: 85 %
Platelets: 311 10*3/uL (ref 150–400)
RBC: 4.18 MIL/uL — ABNORMAL LOW (ref 4.22–5.81)
RDW: 12.5 % (ref 11.5–15.5)
WBC: 6.2 10*3/uL (ref 4.0–10.5)

## 2016-12-11 LAB — URINE CULTURE: CULTURE: NO GROWTH

## 2016-12-11 IMAGING — DX DG CHEST 2V
2 series · 2 of 2 positions shown · non-contrast
Comparison: [DATE]

CLINICAL DATA: Pneumonia.

EXAM:
CHEST  2 VIEW

[x chest ap]
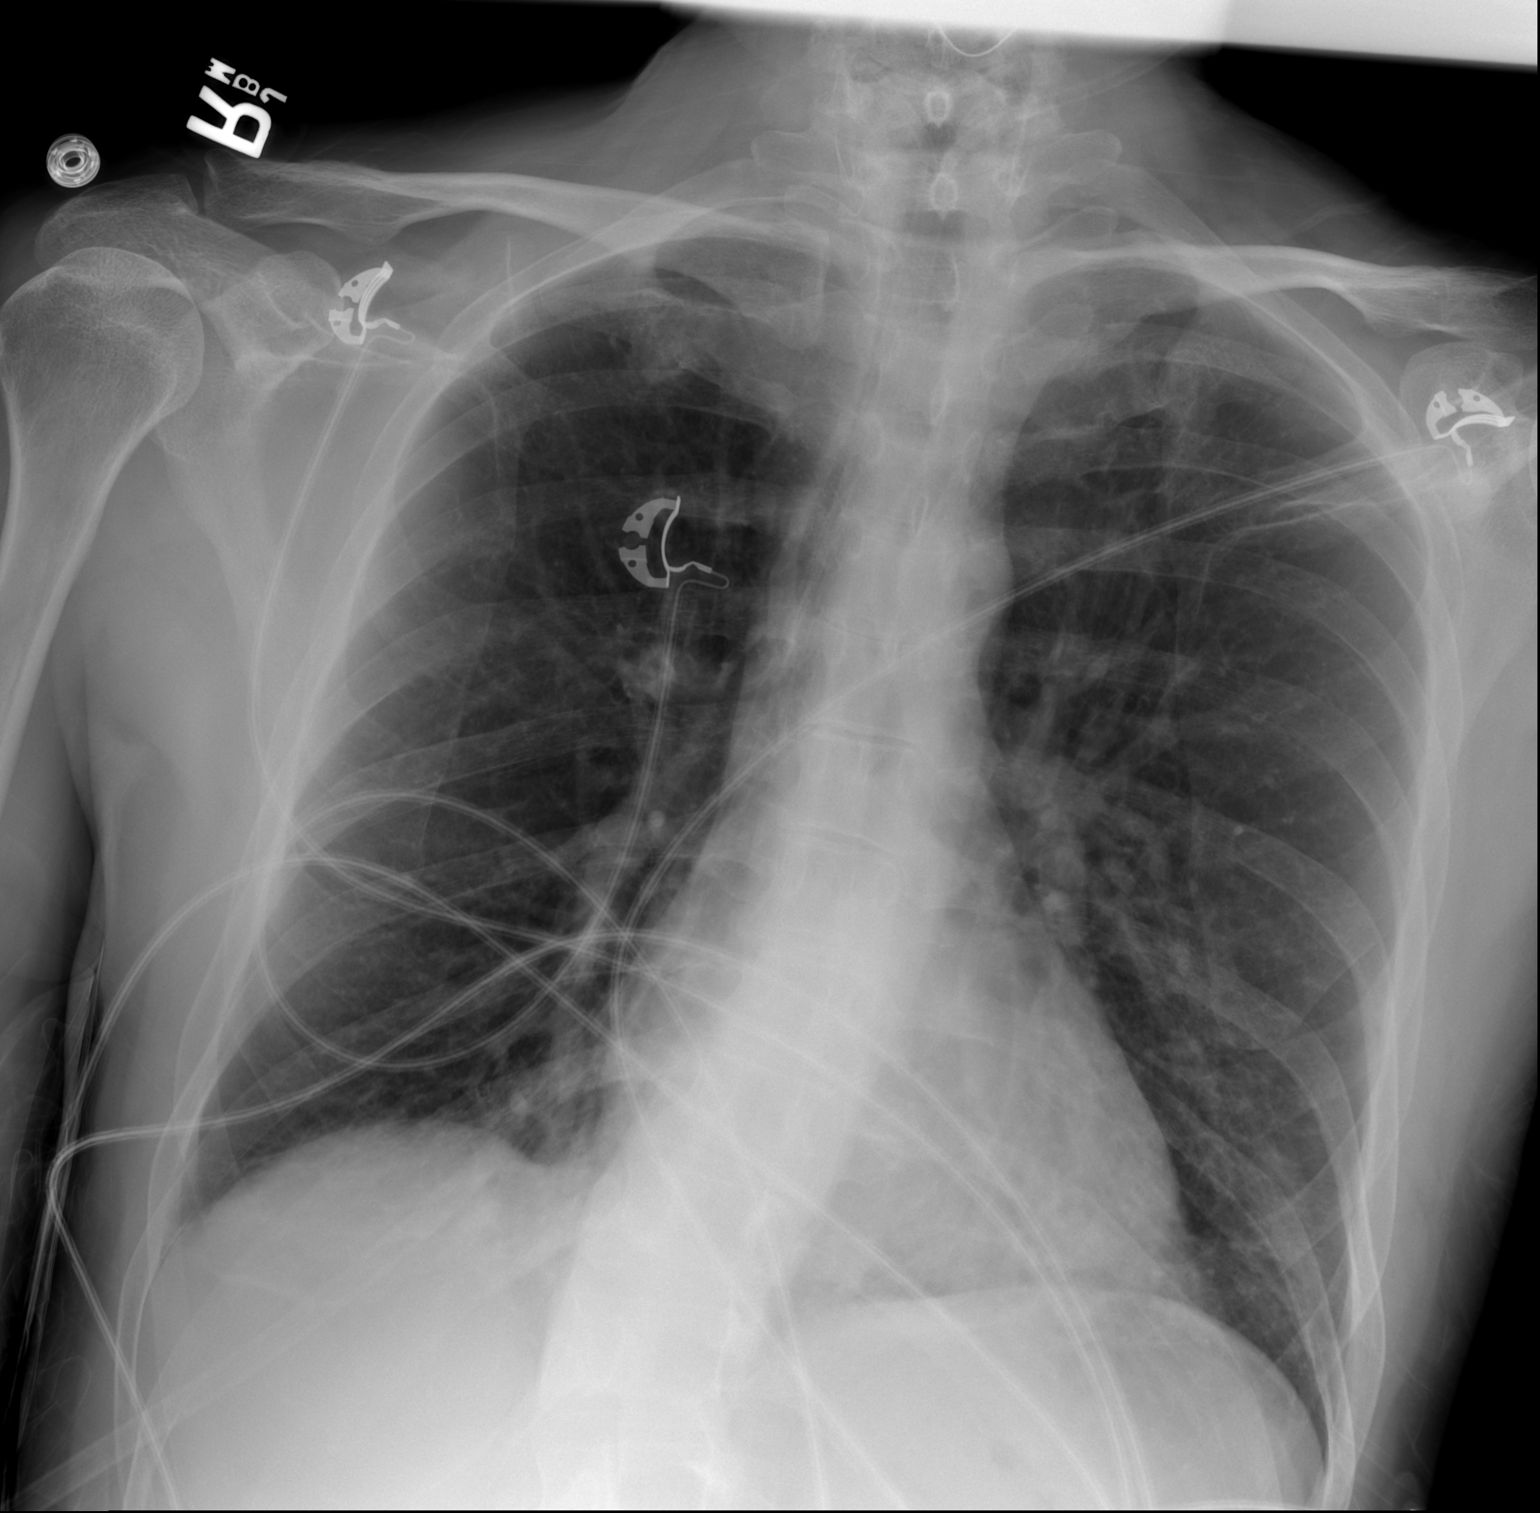

[w chest lat]
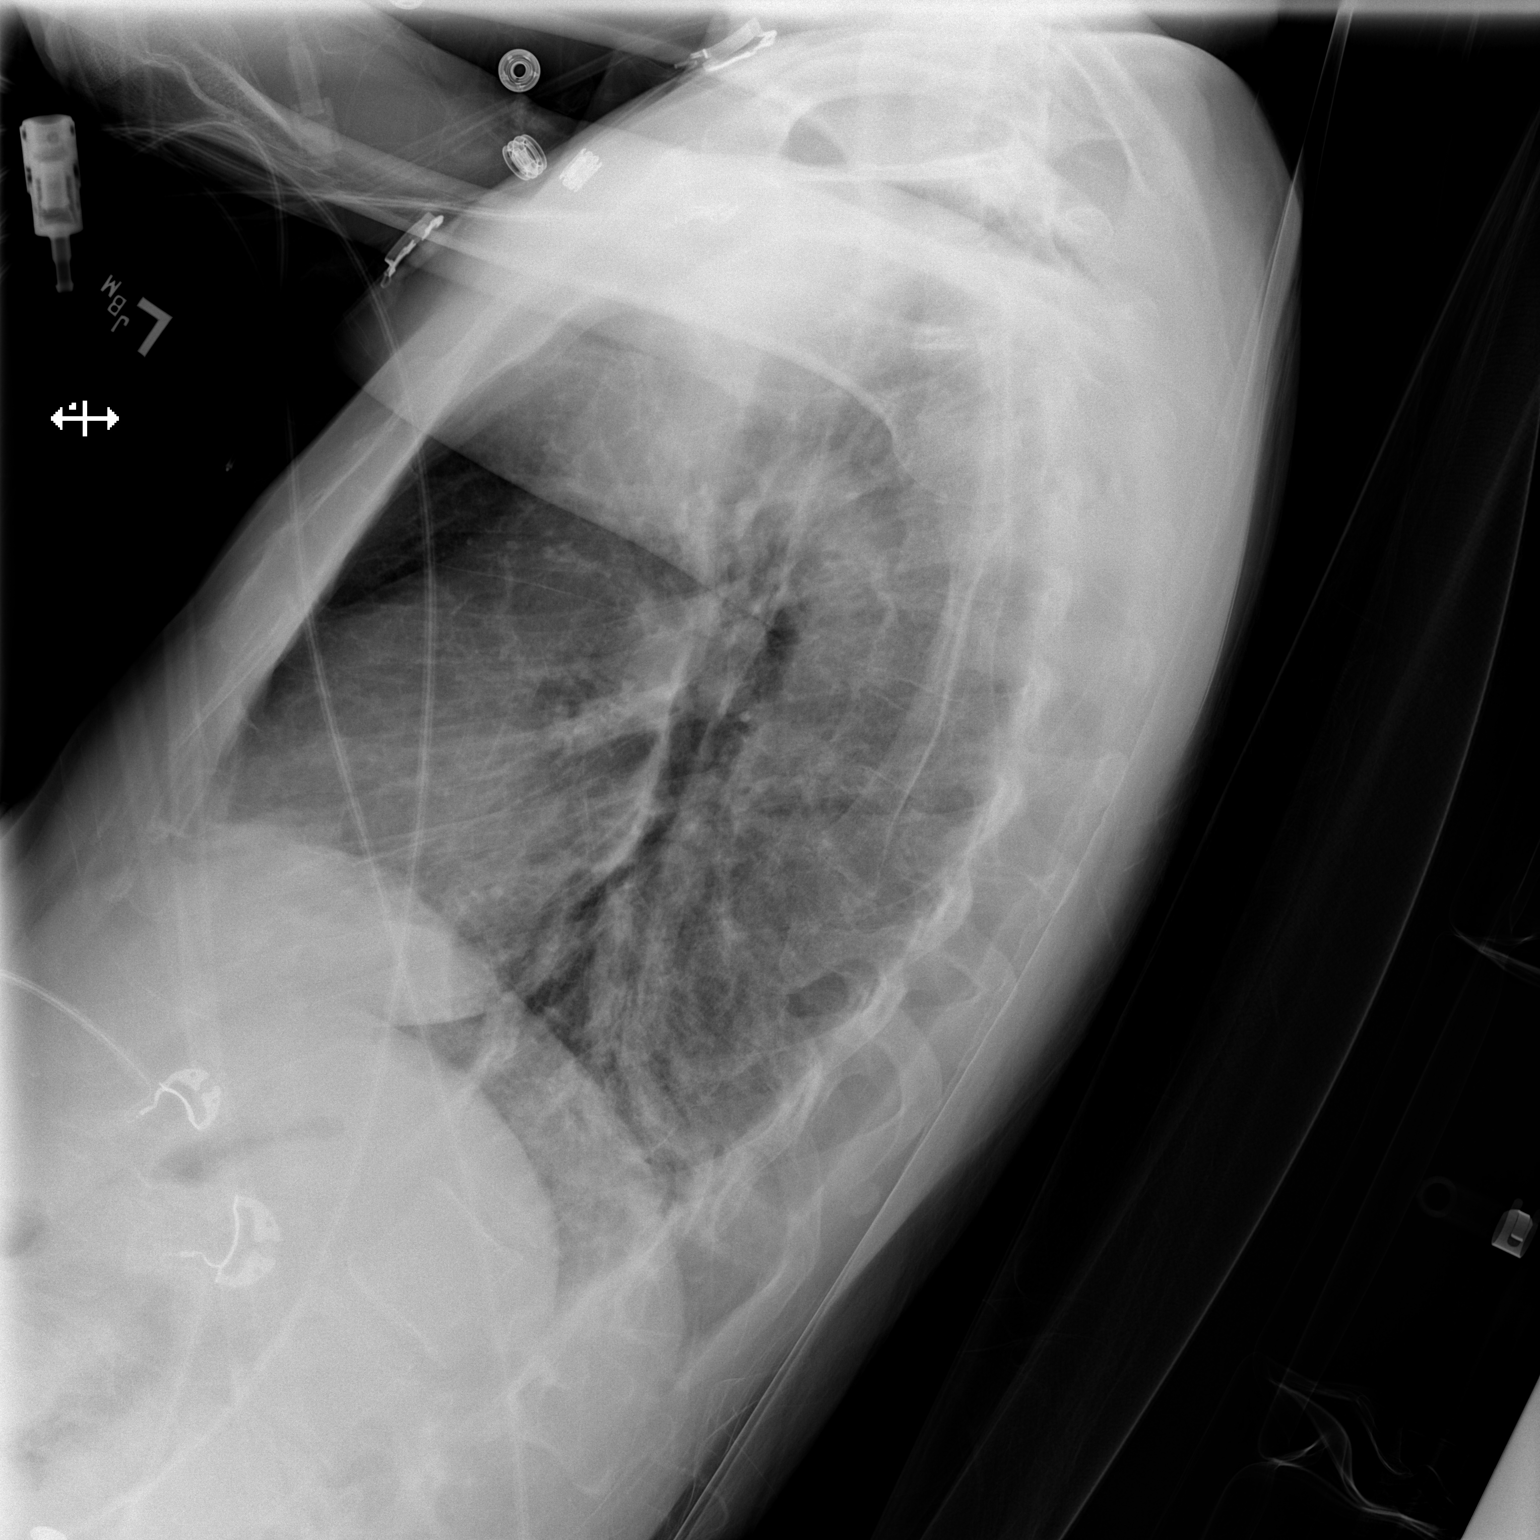

[2 of 2 positions shown; findings below may reference images not displayed]

FINDINGS: AP and lateral views of the chest show hyperexpansion without focal
airspace consolidation. No overt airspace pulmonary edema.
Cardiopericardial silhouette is at upper limits of normal for size.
The visualized bony structures of the thorax are intact. Telemetry
leads overlie the chest.
IMPRESSION: No active cardiopulmonary disease.

## 2016-12-11 MED ORDER — BENZONATATE 100 MG PO CAPS
100.0000 mg | ORAL_CAPSULE | Freq: Once | ORAL | Status: AC
  Administered 2016-12-11: 100 mg via ORAL
  Filled 2016-12-11: qty 1

## 2016-12-11 MED ORDER — SODIUM CHLORIDE 0.9 % IV BOLUS (SEPSIS)
1000.0000 mL | Freq: Once | INTRAVENOUS | Status: AC
  Administered 2016-12-11: 1000 mL via INTRAVENOUS

## 2016-12-11 MED ORDER — ONDANSETRON HCL 4 MG/2ML IJ SOLN
4.0000 mg | Freq: Once | INTRAMUSCULAR | Status: AC
  Administered 2016-12-11: 4 mg via INTRAVENOUS
  Filled 2016-12-11: qty 2

## 2016-12-11 MED ORDER — BENZONATATE 100 MG PO CAPS: 100.0000 mg | ORAL_CAPSULE | Freq: Three times a day (TID) | ORAL | 0 refills | Status: DC

## 2016-12-11 NOTE — ED Provider Notes (Signed)
[facility]-EMERGENCY DEPT Provider Note   CSN: [id number] Arrival date & time: 12/11/16  1928     History   Chief Complaint Chief Complaint  Patient presents with  . Pneumonia    HPI Gary [name] is a 51 y.o. male.  [facility] and diagnosed with right middle lobe pneumonia and a uti. He continues to feel weak, low energy, poor oral intake, hot and cold, malaise.  Severity of symptoms is moderate. Nothing makes symptoms better or worse.      Past Medical History:  Diagnosis Date  . Anxiety   . Depression   . Hyperhydrosis disorder     Patient Active Problem List   Diagnosis Date Noted  . Severe recurrent major depression without psychotic features (HCC) 11/06/2015  . GAD (generalized anxiety disorder) 11/06/2015    Past Surgical History:  Procedure Laterality Date  . HERNIA REPAIR         Home Medications    Prior to Admission medications   Medication Sig Start Date End Date Taking? Authorizing Provider  benztropine (COGENTIN) 1 MG tablet Take 1 mg by mouth 2 (two) times daily.   Yes [provider]  cephALEXin (KEFLEX) 500 MG capsule Take 1 capsule (500 mg total) by mouth 2 (two) times daily. Patient taking differently: Take 500 mg by mouth 2 (two) times daily. 7 day course started 12/11/16 am 12/10/16 12/17/16 Yes Joy, Shawn C, PA-C  Cholecalciferol (VITAMIN D3) 5000 units TABS Take 5,000 Units by mouth daily.    Yes [provider]  dicyclomine (BENTYL) 20 MG tablet Take 10 mg by mouth daily as needed (stomach spasms).    Yes [provider]  divalproex (DEPAKOTE ER) 250 MG 24 hr tablet Take 250 mg by mouth 2 (two) times daily.   Yes [provider]  doxycycline (VIBRAMYCIN) 100 MG capsule Take 1 capsule (100 mg total) by mouth 2 (two) times daily. Patient taking differently: Take 100 mg by mouth 2 (two) times daily. 5 day course started 12/11/16 am 12/10/16 12/15/16 Yes Joy, Shawn C, PA-C    famotidine (PEPCID) 20 MG tablet Take 20 mg by mouth at bedtime as needed for heartburn or indigestion.    Yes [provider]  FLUoxetine (PROZAC) 40 MG capsule Take 40 mg by mouth daily.   Yes [provider]  gabapentin (NEURONTIN) 300 MG capsule Take 300 mg by mouth 2 (two) times daily.    Yes [provider]  pantoprazole (PROTONIX) 40 MG tablet Take 40 mg by mouth daily.   Yes [provider]  benzonatate (TESSALON) 100 MG capsule Take 1 capsule (100 mg total) by mouth every 8 (eight) hours. 12/11/16   Donnetta Hutchingook, Zsofia Prout, MD    Family History History reviewed. No pertinent family history.  Social History Social History  Substance Use Topics  . Smoking status: Never Smoker  . Smokeless tobacco: Never Used  . Alcohol use No     Allergies   Bee venom and Erythromycin   Review of Systems Review of Systems  All other systems reviewed and are negative.    Physical Exam Updated Vital Signs BP 124/73   Pulse 84   Temp 98.4 F (36.9 C)   Resp 13   Ht 5\' 3"  (1.6 m)   Wt 55.8 kg (123 lb)   SpO2 95%   BMI 21.79 kg/m   Physical Exam  Constitutional: He is oriented to person, place, and time.  Dehydrated, nontoxic appearing.  Minimal cough.  HENT:  Head: Normocephalic and atraumatic.  Eyes: Conjunctivae are normal.  Neck: Neck supple.  Cardiovascular: Normal rate and regular rhythm.   Pulmonary/Chest: Effort normal and breath sounds normal.  Abdominal: Soft. Bowel sounds are normal.  Musculoskeletal: Normal range of motion.  Neurological: He is alert and oriented to person, place, and time.  Skin: Skin is warm and dry.  Psychiatric: He has a normal mood and affect. His behavior is normal.  Nursing note and vitals reviewed.    ED Treatments / Results  Labs (all labs ordered are listed, but only abnormal results are displayed) Labs Reviewed  CBC WITH DIFFERENTIAL/PLATELET - Abnormal; Notable for the following:       Result Value    RBC 4.18 (*)    HCT 38.2 (*)    Lymphs Abs 0.4 (*)    All other components within normal limits  COMPREHENSIVE METABOLIC PANEL - Abnormal; Notable for the following:    Sodium 129 (*)    Potassium 3.4 (*)    Chloride 91 (*)    Glucose, Bld 113 (*)    Albumin 3.1 (*)    All other components within normal limits  URINALYSIS, ROUTINE W REFLEX MICROSCOPIC    EKG  EKG Interpretation None       Radiology Dg Chest 2 View  Result Date: 12/11/2016 CLINICAL DATA:  Pneumonia. EXAM: CHEST  2 VIEW COMPARISON:  12/10/2016 FINDINGS: AP and lateral views of the chest show hyperexpansion without focal airspace consolidation. No overt airspace pulmonary edema. Cardiopericardial silhouette is at upper limits of normal for size. The visualized bony structures of the thorax are intact. Telemetry leads overlie the chest. IMPRESSION: No active cardiopulmonary disease. Electronically Signed   By: Kennith Center M.D.   On: 12/11/2016 21:15   Dg Chest 2 View  Result Date: 12/10/2016 CLINICAL DATA:  Worsening cough EXAM: CHEST  2 VIEW COMPARISON:  None. FINDINGS: Cardiac shadow is within normal limits. Patchy right basilar infiltrate is noted projecting in the right middle lobe. No sizable effusion is seen. No acute bony abnormality is noted. Mild scoliotic curvature at the thoracolumbar junction is noted. IMPRESSION: Right middle lobe infiltrate. Electronically Signed   By: Alcide Clever M.D.   On: 12/10/2016 12:16    Procedures Procedures (including critical care time)  Medications Ordered in ED Medications  sodium chloride 0.9 % bolus 1,000 mL (0 mLs Intravenous Stopped 12/11/16 2318)  ondansetron (ZOFRAN) injection 4 mg (4 mg Intravenous Given 12/11/16 2142)  sodium chloride 0.9 % bolus 1,000 mL (0 mLs Intravenous Stopped 12/11/16 2318)  benzonatate (TESSALON) capsule 100 mg (100 mg Oral Given 12/11/16 2203)  sodium chloride 0.9 % bolus 1,000 mL (1,000 mLs Intravenous New Bag/Given 12/11/16 2321)      Initial Impression / Assessment and Plan / ED Course  I have reviewed the triage vital signs and the nursing notes.  Pertinent labs & imaging results that were available during my care of the patient were reviewed by me and considered in my medical decision making (see chart for details).     Patient is dehydrated. He feels much better after 3 L of IV fluids. Repeat chest x-ray shows no obvious pneumonia. He will continue his home medications.  Final Clinical Impressions(s) / ED Diagnoses   Final diagnoses:  Dehydration    New Prescriptions New Prescriptions   BENZONATATE (TESSALON) 100 MG CAPSULE    Take 1 capsule (100 mg total) by mouth every 8 (eight) hours.  Donnetta Hutching, MD 12/11/16 [phone]

## 2016-12-11 NOTE — ED Notes (Signed)
Pt unable to urinate spoke to edp and we are going to dc urine

## 2016-12-11 NOTE — ED Triage Notes (Signed)
Pt was seen yestarday diagnosis with pneumonia pt states he given 3 meds to take for the pneumonia and the UTI and has been taking them and feels like they are not helping because he still feels weak and just feels worst.

## 2016-12-11 NOTE — Discharge Instructions (Signed)
Continue your antibiotics. Increase fluids. Prescription for cough. Tylenol for fever

## 2016-12-12 NOTE — ED Notes (Signed)
PT states understanding of care given, medication prescribed. PT ambulated from ED to car with a steady gait.

## 2016-12-13 LAB — GC/CHLAMYDIA PROBE AMP (~~LOC~~) NOT AT ARMC
CHLAMYDIA, DNA PROBE: NEGATIVE
NEISSERIA GONORRHEA: NEGATIVE

## 2016-12-14 ENCOUNTER — Encounter: Payer: Self-pay | Admitting: Pediatric Intensive Care

## 2016-12-17 NOTE — Congregational Nurse Program (Signed)
Congregational Nurse Program Note  Date of Encounter: 11/22/2016  Past Medical History: Past Medical History:  Diagnosis Date  . Anxiety   . Depression   . Hyperhydrosis disorder     Encounter Details:     CNP Questionnaire - 12/10/16 0930      Patient Demographics   Is this a new or existing patient? Existing   Patient is considered a/an Not Applicable   Race Caucasian/White     Patient Assistance   Location of Patient Assistance [facility]   Patient's financial/insurance status Low Income;Medicaid   Uninsured Patient ([employer] officer, trade unionCard/Care Connects) No   Patient referred to apply for the following financial assistance Not Applicable   Food insecurities addressed Not Applicable   Transportation assistance Yes   Type of Assistance Taxi Voucher Given   Assistance securing medications No   Educational health offerings Acute disease     Encounter Details   Primary purpose of visit Acute Illness/Condition Visit   Was an Emergency Department visit averted? No   Does patient have a medical provider? Yes   Patient referred to Emergency Department   Was a mental health screening completed? (GAINS tool) No   Does patient have dental issues? No   Does patient have vision issues? No   Does your patient have an abnormal blood pressure today? No   Since previous encounter, have you referred patient for abnormal blood pressure that resulted in a new diagnosis or medication change? No   Does your patient have an abnormal blood glucose today? No   Since previous encounter, have you referred patient for abnormal blood glucose that resulted in a new diagnosis or medication change? No   Was there a life-saving intervention made? No     BP check. Client continues to have anxiety regarding exit date.Client has PCP visit on 7/12. Will call [facility] with CN to arrange transportation.

## 2016-12-24 ENCOUNTER — Encounter: Payer: Self-pay | Admitting: Pediatric Intensive Care

## 2016-12-28 ENCOUNTER — Encounter: Payer: Self-pay | Admitting: Pediatric Intensive Care

## 2016-12-30 NOTE — Congregational Nurse Program (Signed)
Congregational Nurse Program Note  Date of Encounter: 11/26/2016  Past Medical History: Past Medical History:  Diagnosis Date  . Anxiety   . Depression   . Hyperhydrosis disorder     Encounter Details:     CNP Questionnaire - 12/10/16 0930      Patient Demographics   Is this a new or existing patient? Existing   Patient is considered a/an Not Applicable   Race Caucasian/White     Patient Assistance   Location of Patient Assistance [facility]   Patient's financial/insurance status Low Income;Medicaid   Uninsured Patient ([employer] officer, trade unionCard/Care Connects) No   Patient referred to apply for the following financial assistance Not Applicable   Food insecurities addressed Not Applicable   Transportation assistance Yes   Type of Assistance Taxi Voucher Given   Assistance securing medications No   Educational health offerings Acute disease     Encounter Details   Primary purpose of visit Acute Illness/Condition Visit   Was an Emergency Department visit averted? No   Does patient have a medical provider? Yes   Patient referred to Emergency Department   Was a mental health screening completed? (GAINS tool) No   Does patient have dental issues? No   Does patient have vision issues? No   Does your patient have an abnormal blood pressure today? No   Since previous encounter, have you referred patient for abnormal blood pressure that resulted in a new diagnosis or medication change? No   Does your patient have an abnormal blood glucose today? No   Since previous encounter, have you referred patient for abnormal blood glucose that resulted in a new diagnosis or medication change? No   Was there a life-saving intervention made? No     BP check. CN assisted client with making SC[facility] appointment for pickup for PCP appointment on Monday.

## 2016-12-31 NOTE — Congregational Nurse Program (Signed)
Congregational Nurse Program Note  Date of Encounter: 12/14/2016  Past Medical History: Past Medical History:  Diagnosis Date  . Anxiety   . Depression   . Hyperhydrosis disorder     Encounter Details:     CNP Questionnaire - 12/14/16 0830      Patient Demographics   Is this a new or existing patient? Existing   Patient is considered a/an Not Applicable   Race Caucasian/White     Patient Assistance   Location of Patient Assistance [facility]   Patient's financial/insurance status Medicaid;Low Income   Uninsured Patient ([employer] officer, trade unionCard/Care Connects) No   Interventions Appt. has been completed   Patient referred to apply for the following financial assistance Not Applicable   Food insecurities addressed Not Applicable   Transportation assistance No   Type of Assistance Other   Assistance securing medications Yes   Type of Assistance Other   Educational health offerings Medications     Encounter Details   Primary purpose of visit Education/Health Concerns   Was an Emergency Department visit averted? Not Applicable   Does patient have a medical provider? Yes   Patient referred to Follow up with established PCP   Was a mental health screening completed? (GAINS tool) No   Does patient have dental issues? No   Does patient have vision issues? No   Does your patient have an abnormal blood pressure today? No   Since previous encounter, have you referred patient for abnormal blood pressure that resulted in a new diagnosis or medication change? No   Does your patient have an abnormal blood glucose today? No   Since previous encounter, have you referred patient for abnormal blood glucose that resulted in a new diagnosis or medication change? No   Was there a life-saving intervention made? No    Client running low on several medications and is out of depakote. [facility]. CN called [facility] to confirm available  refills.

## 2017-01-10 ENCOUNTER — Encounter: Payer: Self-pay | Admitting: Pediatric Intensive Care

## 2017-01-21 ENCOUNTER — Encounter: Payer: Self-pay | Admitting: Pediatric Intensive Care

## 2017-01-25 ENCOUNTER — Encounter: Payer: Self-pay | Admitting: Pediatric Intensive Care

## 2017-01-26 NOTE — Congregational Nurse Program (Signed)
Congregational Nurse Program Note  Date of Encounter: 12/24/2016  Past Medical History: Past Medical History:  Diagnosis Date  . Anxiety   . Depression   . Hyperhydrosis disorder     Encounter Details:  Client requires assistance making [facility] reservation for PCP appointment on Monday. CN check medications.

## 2017-02-02 NOTE — Congregational Nurse Program (Signed)
Congregational Nurse Program Note  Date of Encounter: 12/28/2016  Past Medical History: Past Medical History:  Diagnosis Date  . Anxiety   . Depression   . Hyperhydrosis disorder     Encounter Details: Post PCP visit. Client continues to have UTI symptoms and was prescribed a course of Bactrim. CN stressed importance of taking all antibiotics. Cleitn continues to be anxious regarding move to group home.

## 2017-02-06 NOTE — Congregational Nurse Program (Signed)
Congregational Nurse Program Note  Date of Encounter: 01/10/2017  Past Medical History: Past Medical History:  Diagnosis Date  . Anxiety   . Depression   . Hyperhydrosis disorder     Encounter Details:     CNP Questionnaire - 01/10/17 0945      Patient Demographics   Is this a new or existing patient? Existing   Patient is considered a/an Not Applicable   Race Caucasian/White     Patient Assistance   Location of Patient Assistance [facility]   Patient's financial/insurance status Medicaid;Low Income   Uninsured Patient ([employer] officer, trade union) No   Interventions Not Applicable   Patient referred to apply for the following financial assistance Not Applicable   Food insecurities addressed Not Applicable   Transportation assistance Yes   Type of Assistance SCAT   Assistance securing medications No   Type of Assistance Other   Educational health offerings Interpersonal relationships     Encounter Details   Primary purpose of visit Education/Health Concerns   Was an Emergency Department visit averted? Not Applicable   Does patient have a medical provider? Yes   Patient referred to Follow up with established PCP   Was a mental health screening completed? (GAINS tool) No   Does patient have dental issues? No   Does patient have vision issues? No   Does your patient have an abnormal blood pressure today? No   Since previous encounter, have you referred patient for abnormal blood pressure that resulted in a new diagnosis or medication change? No   Does your patient have an abnormal blood glucose today? No   Since previous encounter, have you referred patient for abnormal blood glucose that resulted in a new diagnosis or medication change? No   Was there a life-saving intervention made? No    BP check. CN confirm SCAT ride for PCP appointment today.

## 2017-02-07 NOTE — Congregational Nurse Program (Signed)
Congregational Nurse Program Note  Date of Encounter: 01/21/2017  Past Medical History: Past Medical History:  Diagnosis Date  . Anxiety   . Depression   . Hyperhydrosis disorder     Encounter Details:     CNP Questionnaire - 01/21/17 1100      Patient Demographics   Is this a new or existing patient? Existing   Patient is considered a/an Not Applicable   Race Caucasian/White     Patient Assistance   Location of Patient Assistance [facility]   Patient's financial/insurance status Medicaid;Low Income   Uninsured Patient ([employer] Card/Care Connects) No   Interventions Appt. has been completed;Follow-up/Education/Support provided after completed appt.   Patient referred to apply for the following financial assistance Not Applicable   Food insecurities addressed Not Applicable   Transportation assistance No   Type of Assistance Other   Assistance securing medications Yes   Type of Assistance Other   Educational health offerings Interpersonal relationships;Hypertension;Medications     Encounter Details   Primary purpose of visit Education/Health Concerns   Was an Emergency Department visit averted? Not Applicable   Does patient have a medical provider? Yes   Patient referred to Follow up with established PCP   Was a mental health screening completed? (GAINS tool) No   Does patient have dental issues? No   Does patient have vision issues? No   Does your patient have an abnormal blood pressure today? No   Since previous encounter, have you referred patient for abnormal blood pressure that resulted in a new diagnosis or medication change? No   Does your patient have an abnormal blood glucose today? No   Since previous encounter, have you referred patient for abnormal blood glucose that resulted in a new diagnosis or medication change? No   Was there a life-saving intervention made? No     BP check. Client is leaving for group home next week. Cleint is very anxious regarding change  of environment. CN contacted [facility] supervisor for group home regarding current medications for client.

## 2017-02-23 NOTE — Congregational Nurse Program (Signed)
Congregational Nurse Program Note  Date of Encounter: 01/25/2017  Past Medical History: Past Medical History:  Diagnosis Date  . Anxiety   . Depression   . Hyperhydrosis disorder     Encounter Details:     CNP Questionnaire - 01/25/17 1045      Patient Demographics   Is this a new or existing patient? Existing   Patient is considered a/an Not Applicable   Race Caucasian/White     Patient Assistance   Location of Patient Assistance [facility]   Patient's financial/insurance status Medicaid;Low Income   Uninsured Patient ([employer] officer, trade union) No   Interventions Appt. has been completed   Patient referred to apply for the following financial assistance Not Applicable   Food insecurities addressed Not Applicable   Transportation assistance No   Type of Assistance Other   Assistance securing medications No   Type of Assistance Other   Educational health offerings Interpersonal relationships;Medications     Encounter Details   Primary purpose of visit Education/Health Concerns;Spiritual Care/Support Visit   Was an Emergency Department visit averted? Not Applicable   Does patient have a medical provider? Yes   Patient referred to Follow up with established PCP   Was a mental health screening completed? (GAINS tool) No   Does patient have dental issues? No   Does patient have vision issues? No   Does your patient have an abnormal blood pressure today? No   Since previous encounter, have you referred patient for abnormal blood pressure that resulted in a new diagnosis or medication change? No   Does your patient have an abnormal blood glucose today? No   Since previous encounter, have you referred patient for abnormal blood glucose that resulted in a new diagnosis or medication change? No   Was there a life-saving intervention made? No     BP check. Client is leaving for group home today. He states he is very nervous about leaving the shelter. CN reassured client that he will  make transition to group home and that he will receive support form group home staff.

## 2017-02-24 NOTE — Congregational Nurse Program (Signed)
Congregational Nurse Program Note  Date of Encounter: 01/24/2017  Past Medical History: Past Medical History:  Diagnosis Date  . Anxiety   . Depression   . Hyperhydrosis disorder     Encounter Details:  Preparing to leave the shelter tomorrow to go to a group home.  Client is very tearful, stating he does not want to go.  He wants to stay at the shelter. States he does not understand why he cannot stay at the shelter.  Discussed with him strategies for reframing this event and provided support as his transition to the group home will be difficult for this client.

## 2017-05-16 DIAGNOSIS — F319 Bipolar disorder, unspecified: Secondary | ICD-10-CM | POA: Insufficient documentation

## 2018-02-08 ENCOUNTER — Other Ambulatory Visit: Payer: Self-pay

## 2018-02-08 ENCOUNTER — Emergency Department (HOSPITAL_BASED_OUTPATIENT_CLINIC_OR_DEPARTMENT_OTHER)
Admission: EM | Admit: 2018-02-08 | Discharge: 2018-02-09 | Disposition: A | Discharge status: Home / Self Care | Payer: Medicaid Other | Attending: Emergency Medicine | Admitting: Emergency Medicine

## 2018-02-08 ENCOUNTER — Encounter (HOSPITAL_BASED_OUTPATIENT_CLINIC_OR_DEPARTMENT_OTHER): Payer: Self-pay

## 2018-02-08 ENCOUNTER — Emergency Department (HOSPITAL_BASED_OUTPATIENT_CLINIC_OR_DEPARTMENT_OTHER): Payer: Medicaid Other

## 2018-02-08 DIAGNOSIS — S79911A Unspecified injury of right hip, initial encounter: Secondary | ICD-10-CM | POA: Diagnosis present

## 2018-02-08 DIAGNOSIS — Y939 Activity, unspecified: Secondary | ICD-10-CM | POA: Insufficient documentation

## 2018-02-08 DIAGNOSIS — Z590 Homelessness: Secondary | ICD-10-CM | POA: Diagnosis not present

## 2018-02-08 DIAGNOSIS — E860 Dehydration: Secondary | ICD-10-CM | POA: Diagnosis not present

## 2018-02-08 DIAGNOSIS — Z79899 Other long term (current) drug therapy: Secondary | ICD-10-CM | POA: Insufficient documentation

## 2018-02-08 DIAGNOSIS — R21 Rash and other nonspecific skin eruption: Secondary | ICD-10-CM | POA: Diagnosis not present

## 2018-02-08 DIAGNOSIS — X58XXXA Exposure to other specified factors, initial encounter: Secondary | ICD-10-CM | POA: Diagnosis not present

## 2018-02-08 DIAGNOSIS — Y999 Unspecified external cause status: Secondary | ICD-10-CM | POA: Insufficient documentation

## 2018-02-08 DIAGNOSIS — M9701XA Periprosthetic fracture around internal prosthetic right hip joint, initial encounter: Secondary | ICD-10-CM | POA: Insufficient documentation

## 2018-02-08 DIAGNOSIS — Y929 Unspecified place or not applicable: Secondary | ICD-10-CM | POA: Diagnosis not present

## 2018-02-08 HISTORY — DX: Gastro-esophageal reflux disease without esophagitis: K21.9

## 2018-02-08 HISTORY — DX: Bipolar II disorder: F31.81

## 2018-02-08 LAB — CBC WITH DIFFERENTIAL/PLATELET
Basophils Absolute: 0 10*3/uL (ref 0.0–0.1)
Basophils Relative: 1 %
Eosinophils Absolute: 0.1 10*3/uL (ref 0.0–0.7)
Eosinophils Relative: 1 %
HEMATOCRIT: 42 % (ref 39.0–52.0)
HEMOGLOBIN: 14.6 g/dL (ref 13.0–17.0)
LYMPHS ABS: 1.4 10*3/uL (ref 0.7–4.0)
Lymphocytes Relative: 22 %
MCH: 32.4 pg (ref 26.0–34.0)
MCHC: 34.8 g/dL (ref 30.0–36.0)
MCV: 93.3 fL (ref 78.0–100.0)
MONOS PCT: 8 %
Monocytes Absolute: 0.5 10*3/uL (ref 0.1–1.0)
NEUTROS ABS: 4.1 10*3/uL (ref 1.7–7.7)
NEUTROS PCT: 68 %
Platelets: 335 10*3/uL (ref 150–400)
RBC: 4.5 MIL/uL (ref 4.22–5.81)
RDW: 14.4 % (ref 11.5–15.5)
WBC: 6.1 10*3/uL (ref 4.0–10.5)

## 2018-02-08 LAB — BASIC METABOLIC PANEL
Anion gap: 11 (ref 5–15)
BUN: 32 mg/dL — AB (ref 6–20)
CHLORIDE: 98 mmol/L (ref 98–111)
CO2: 26 mmol/L (ref 22–32)
CREATININE: 1.4 mg/dL — AB (ref 0.61–1.24)
Calcium: 9.4 mg/dL (ref 8.9–10.3)
GFR calc non Af Amer: 56 mL/min — ABNORMAL LOW (ref >60)
Glucose, Bld: 100 mg/dL — ABNORMAL HIGH (ref 70–99)
Potassium: 4.6 mmol/L (ref 3.5–5.1)
Sodium: 135 mmol/L (ref 135–145)

## 2018-02-08 IMAGING — DX DG HIP (WITH OR WITHOUT PELVIS) 2-3V*R*
3 series · 3 of 3 positions shown · non-contrast
Comparison: None.

CLINICAL DATA: Right hip pain over the surgical site over the past
week. Generalized fatigue. No recent injury. Right hip arthroplasty
in [DATE]

EXAM:
DG HIP (WITH OR WITHOUT PELVIS) 2-3V RIGHT

[pelvis ap]
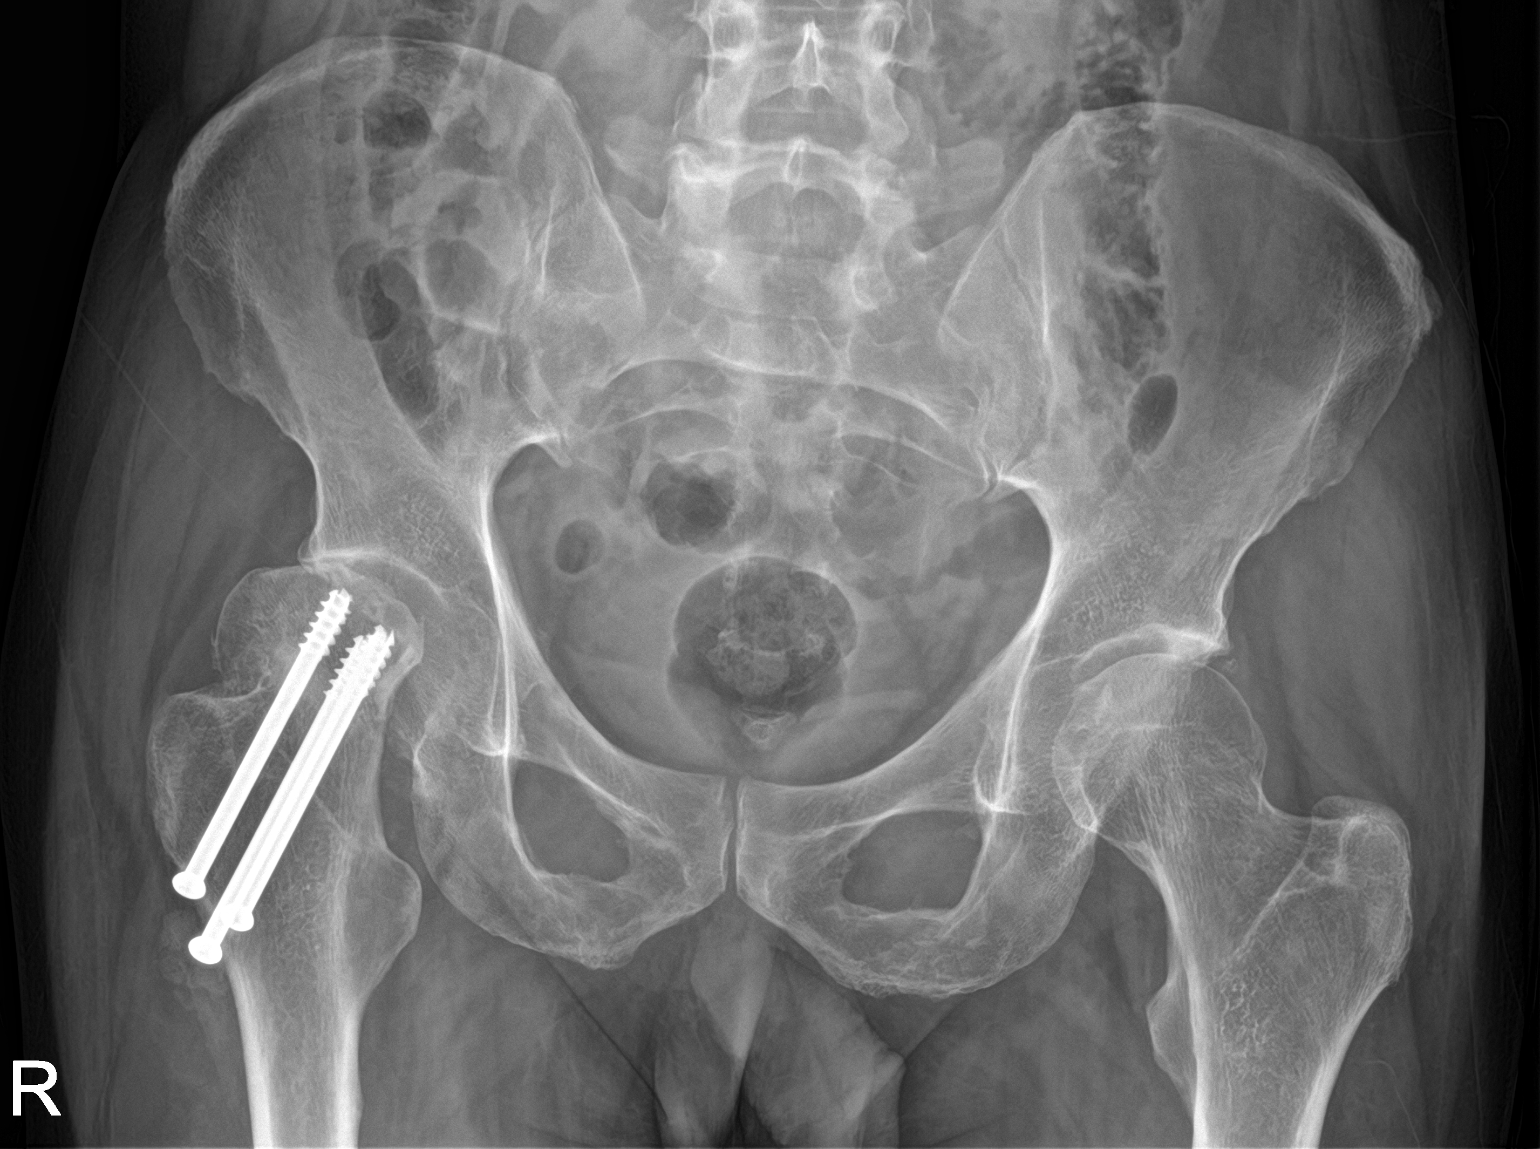

[hip ap]
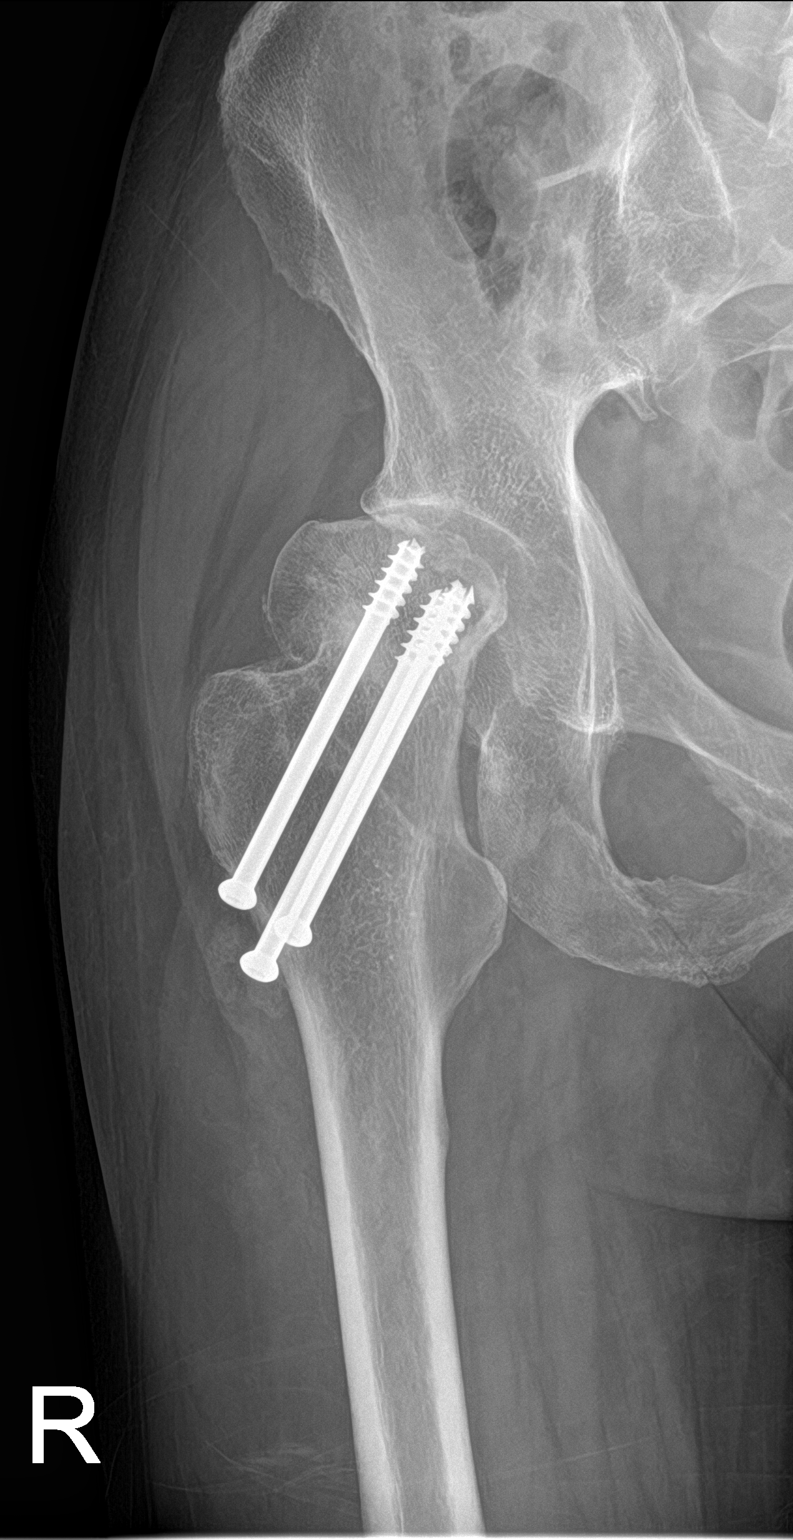

[hip frog leg]
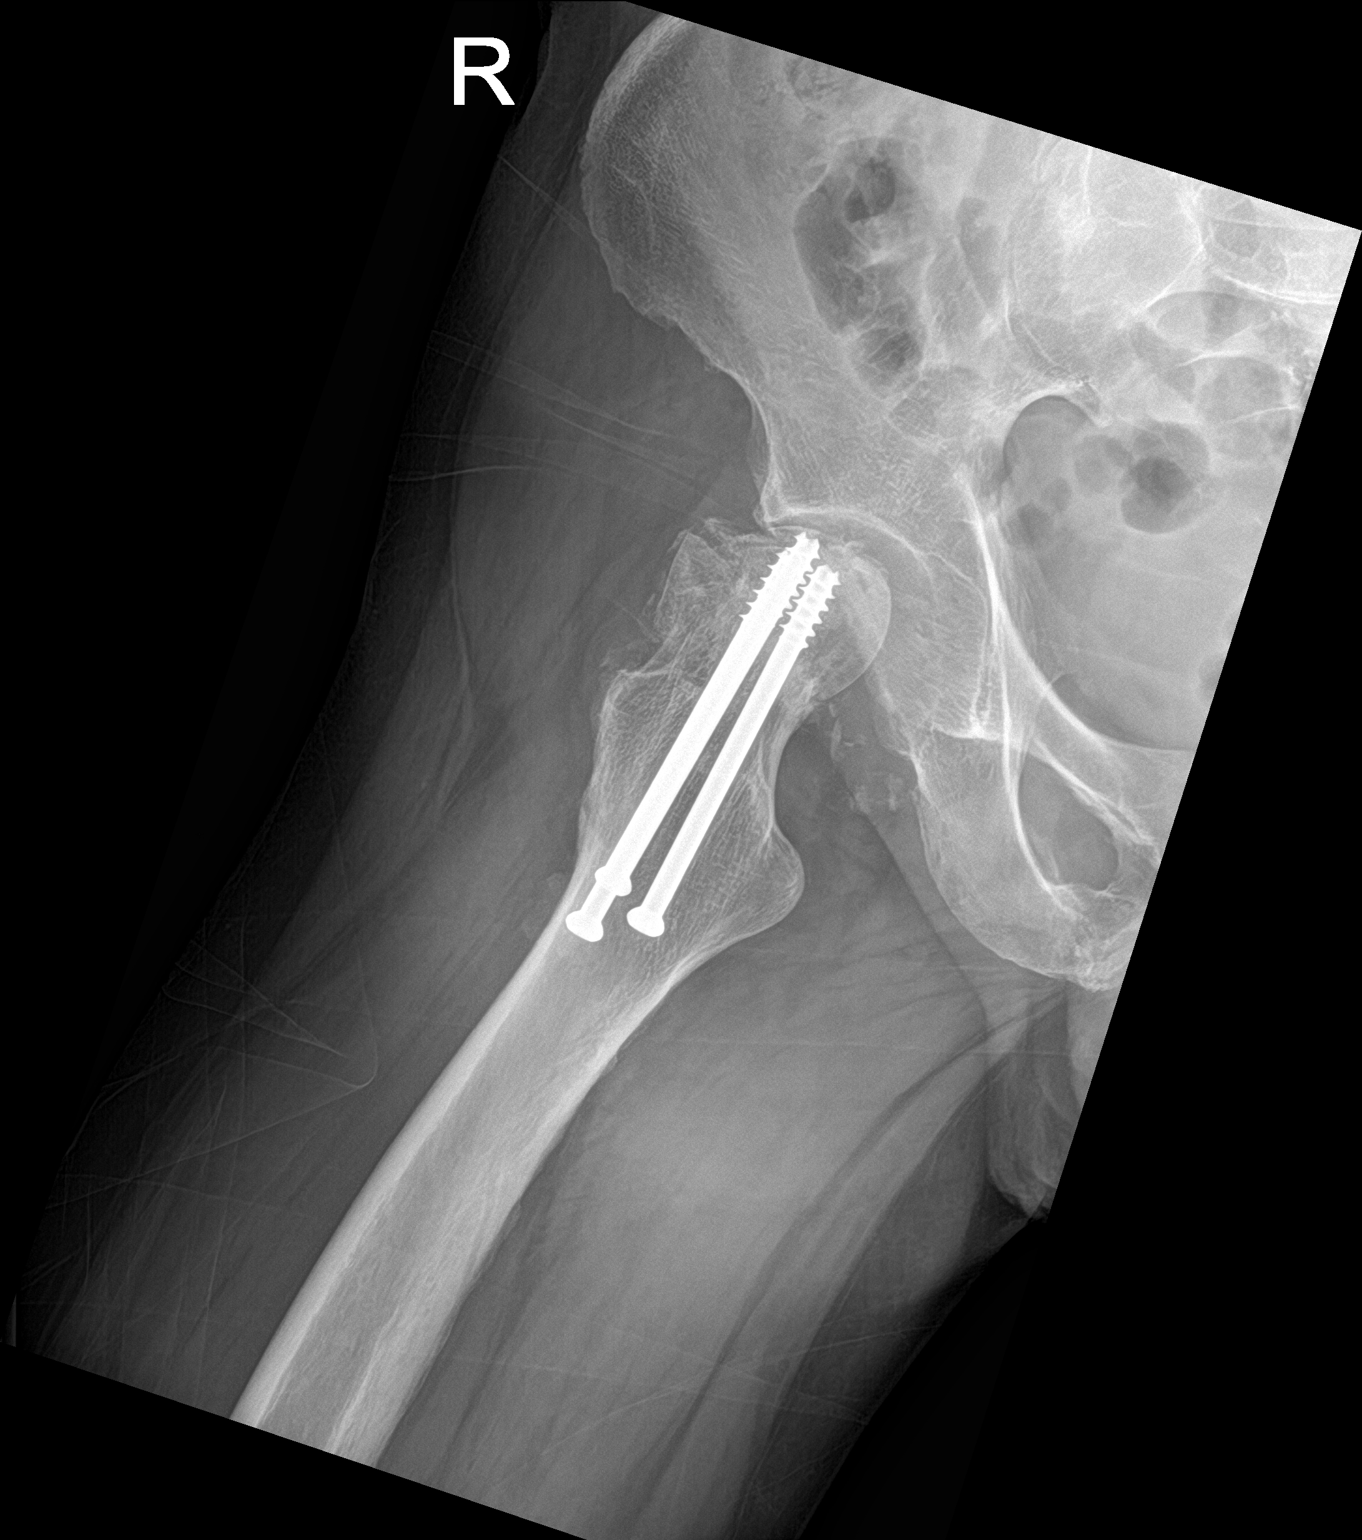

[3 of 3 positions shown; findings below may reference images not displayed]

FINDINGS: Old internal fixation of the right femoral neck using 3 screws.
There is deformity and resorption of the superior aspect of the
right femoral head. Acute appearing cortical fragments demonstrated
along the superior femoral head suggest acute depressed fractures
superimposed upon the chronic changes. Partial lateral subluxation
within the right hip joint without frank dislocation. Pelvis appears
intact. SI joints and symphysis pubis are not displaced. Soft
tissues are unremarkable.
IMPRESSION: 1. Postoperative changes in the right hip with probable chronic
deformity of the right femoral head.
2. Acute cortical fragments demonstrated along the superior aspect
of the femoral head suggest acute depressed fracture superimposed
upon the chronic changes.
3. Degenerative changes in the right hip with partial lateral
subluxation within the acetabular joint.

## 2018-02-08 MED ORDER — DIPHENHYDRAMINE HCL 25 MG PO CAPS
50.0000 mg | ORAL_CAPSULE | Freq: Once | ORAL | Status: AC
  Administered 2018-02-08: 50 mg via ORAL
  Filled 2018-02-08: qty 2

## 2018-02-08 MED ORDER — LACTATED RINGERS IV BOLUS
1000.0000 mL | Freq: Once | INTRAVENOUS | Status: AC
  Administered 2018-02-08: 1000 mL via INTRAVENOUS

## 2018-02-08 MED ORDER — SODIUM CHLORIDE 0.9 % IV BOLUS
1000.0000 mL | Freq: Once | INTRAVENOUS | Status: AC
  Administered 2018-02-08: 1000 mL via INTRAVENOUS

## 2018-02-08 MED ORDER — ACETAMINOPHEN 325 MG PO TABS
650.0000 mg | ORAL_TABLET | Freq: Once | ORAL | Status: AC
  Administered 2018-02-08: 650 mg via ORAL
  Filled 2018-02-08: qty 2

## 2018-02-08 NOTE — ED Provider Notes (Addendum)
[facility] EMERGENCY DEPARTMENT Provider Note   CSN: [id number] Arrival date & time: 02/08/18  1953     History   Chief Complaint Chief Complaint  Patient presents with  . Hip Pain    HPI Gary [name] is a 52 y.o. male.  HPI  52 year old male presents with right hip pain and dehydration.  He states for about a week he is been feeling dehydrated with dizziness upon standing and decreased p.o. intake.  He lives in a homeless shelter and states that he has not been drinking much water due to lack of resources.  He is also not been eating as much.  He has been feeling dizzy when standing.  He feels generally weak but no focal weakness.  There is no headache, chest pain, shortness of breath or vomiting/diarrhea.  He also has a rash that he states is from bedbugs that he states crawl on him at night and is very itchy.  He is been having chronic right hip pain since surgery for a broken hip several months ago but the pain seems to be a little bit worse over the last 1 week or so. No fevers.  Past Medical History:  Diagnosis Date  . Anxiety   . Bipolar 2 disorder (HCC)   . Depression   . GERD (gastroesophageal reflux disease)   . Hyperhydrosis disorder     Patient Active Problem List   Diagnosis Date Noted  . Severe recurrent major depression without psychotic features (HCC) 11/06/2015  . GAD (generalized anxiety disorder) 11/06/2015    Past Surgical History:  Procedure Laterality Date  . HERNIA REPAIR    . HIP SURGERY Right         Home Medications    Prior to Admission medications   Medication Sig Start Date End Date Taking? Authorizing Provider  benzonatate (TESSALON) 100 MG capsule Take 1 capsule (100 mg total) by mouth every 8 (eight) hours. 12/11/16   Donnetta Hutching, MD  benztropine (COGENTIN) 1 MG tablet Take 1 mg by mouth 2 (two) times daily.    [provider]  Cholecalciferol (VITAMIN D3) 5000 units TABS Take 5,000 Units by mouth daily.      [provider]  dicyclomine (BENTYL) 20 MG tablet Take 10 mg by mouth daily as needed (stomach spasms).     [provider]  divalproex (DEPAKOTE ER) 250 MG 24 hr tablet Take 250 mg by mouth 2 (two) times daily.    [provider]  famotidine (PEPCID) 20 MG tablet Take 20 mg by mouth at bedtime as needed for heartburn or indigestion.     [provider]  FLUoxetine (PROZAC) 40 MG capsule Take 40 mg by mouth daily.    [provider]  gabapentin (NEURONTIN) 300 MG capsule Take 300 mg by mouth 2 (two) times daily.     [provider]  pantoprazole (PROTONIX) 40 MG tablet Take 40 mg by mouth daily.    [provider]    Family History No family history on file.  Social History Social History   Tobacco Use  . Smoking status: Never Smoker  . Smokeless tobacco: Never Used  Substance Use Topics  . Alcohol use: No  . Drug use: No     Allergies   Bee venom and Erythromycin   Review of Systems Review of Systems  Constitutional: Positive for fatigue. Negative for fever.  Respiratory: Negative for shortness of breath.   Cardiovascular: Negative for chest pain.  Gastrointestinal:  Negative for abdominal pain, diarrhea and vomiting.  Musculoskeletal: Positive for arthralgias.  Neurological: Positive for weakness and light-headedness. Negative for headaches.  All other systems reviewed and are negative.    Physical Exam Updated Vital Signs BP (!) 143/72   Pulse 78   Temp 98.8 F (37.1 C) (Oral)   Resp 16   Ht 5\' 2"  (1.575 m)   Wt 56.2 kg   SpO2 100%   BMI 22.68 kg/m   Physical Exam  Constitutional: He is oriented to person, place, and time. He appears well-developed and well-nourished. No distress.  HENT:  Head: Normocephalic and atraumatic.  Right Ear: External ear normal.  Left Ear: External ear normal.  Nose: Nose normal.  Eyes: Right eye exhibits no discharge. Left eye exhibits no discharge.  Neck: Neck  supple.  Cardiovascular: Normal rate, regular rhythm and normal heart sounds.  Pulmonary/Chest: Effort normal and breath sounds normal.  Abdominal: Soft. There is no tenderness.  Musculoskeletal: He exhibits no edema.       Right hip: He exhibits normal range of motion and no tenderness.  Surgical scar is well appearing. Pain with ROM of hip but no limited ROM or point tenderness  Neurological: He is alert and oriented to person, place, and time.  CN 3-12 grossly intact. 5/5 strength in all 4 extremities. Grossly normal sensation. Normal finger to nose.   Skin: Skin is warm and dry. Rash (diffuse, irregular maculopapular rash, mostly to extremities and anterior abdomen) noted. He is not diaphoretic.  Nursing note and vitals reviewed.    ED Treatments / Results  Labs (all labs ordered are listed, but only abnormal results are displayed) Labs Reviewed  BASIC METABOLIC PANEL - Abnormal; Notable for the following components:      Result Value   Glucose, Bld 100 (*)    BUN 32 (*)    Creatinine, Ser 1.40 (*)    GFR calc non Af Amer 56 (*)    All other components within normal limits  CBC WITH DIFFERENTIAL/PLATELET    EKG EKG Interpretation  Date/Time:  Wednesday February 08 2018 21:08:36 EDT Ventricular Rate:  78 PR Interval:    QRS Duration: 83 QT Interval:  378 QTC Calculation: 431 R Axis:   83 Text Interpretation:  Sinus rhythm Short PR interval no acute ST/T changes No old tracing to compare Confirmed by Pricilla LovelessGoldston, Brinnley Lacap [phone]([id number]) on 02/08/2018 9:26:52 PM   Radiology Dg Hip Unilat  With Pelvis 2-3 Views Right  Result Date: 02/08/2018 CLINICAL DATA:  Right hip pain over the surgical site over the past week. Generalized fatigue. No recent injury. Right hip arthroplasty in 04/2017 EXAM: DG HIP (WITH OR WITHOUT PELVIS) 2-3V RIGHT COMPARISON:  None. FINDINGS: Old internal fixation of the right femoral neck using 3 screws. There is deformity and resorption of the superior aspect of  the right femoral head. Acute appearing cortical fragments demonstrated along the superior femoral head suggest acute depressed fractures superimposed upon the chronic changes. Partial lateral subluxation within the right hip joint without frank dislocation. Pelvis appears intact. SI joints and symphysis pubis are not displaced. Soft tissues are unremarkable. IMPRESSION: 1. Postoperative changes in the right hip with probable chronic deformity of the right femoral head. 2. Acute cortical fragments demonstrated along the superior aspect of the femoral head suggest acute depressed fracture superimposed upon the chronic changes. 3. Degenerative changes in the right hip with partial lateral subluxation within the acetabular joint. Electronically Signed   By: Marisa CyphersWilliam  Stevens M.D.  On: 02/08/2018 22:12    Procedures Procedures (including critical care time)  Medications Ordered in ED Medications  sodium chloride 0.9 % bolus 1,000 mL ( Intravenous Stopped 02/08/18 2219)  acetaminophen (TYLENOL) tablet 650 mg (650 mg Oral Given 02/08/18 2056)  diphenhydrAMINE (BENADRYL) capsule 50 mg (50 mg Oral Given 02/08/18 2058)  lactated ringers bolus 1,000 mL ( Intravenous Stopped 02/08/18 2334)     Initial Impression / Assessment and Plan / ED Course  I have reviewed the triage vital signs and the nursing notes.  Pertinent labs & imaging results that were available during my care of the patient were reviewed by me and considered in my medical decision making (see chart for details).     Patient's dizziness/weakness appears to be due to decreased p.o. intake/dehydration.  Vital signs are unremarkable. Creatinine on 9/12 at [facility] was 1.18. Thus creatinine today is mildly bumped but no renal failure. Likely does have dehydration given elevated BUN.  His right hip pain for 1 week or more appears to be due to the changes seen on x-ray.  I discussed with Dr. Samuel Bouche' partner on-call, Dr. Christell Constant, who advises patient  to call Dr. Samuel Bouche office tomorrow for follow-up tomorrow.  Otherwise, does not need emergent orthopedic referral tonight.  Patient instructed in this as well as instructed with increased p.o. intake.  Discharge home with return precautions.  12:12 AM Patient is requesting an admission.  He does not appear to meet any admission criteria.  He is stating that it is too painful to walk but he is able to stand on his own 2 feet without support and can bear weight.  I will give him crutches to help with the discomfort to take pressure off of his foot but I think the most prudent thing for him to do is to follow-up with orthopedics as an outpatient as previously described.  Final Clinical Impressions(s) / ED Diagnoses   Final diagnoses:  Dehydration  Periprosthetic fracture around internal prosthetic right hip joint, initial encounter [facility])    ED Discharge Orders    None       Pricilla Loveless, MD 02/08/18 2349    Pricilla Loveless, MD 02/09/18 [phone]

## 2018-02-08 NOTE — ED Triage Notes (Signed)
Pt presents via EMS from [facility]. Pt called EMS for right hip pain. Pt reports he had right hip surgery in December of last year and the surgical site is the site of his pain.  Per EMS pt walked to stretcher. Pt has multiple bug bites to arms. PT told EMS that the place he was staying was infested with bed bugs. Pt reports no recent injury.

## 2018-02-08 NOTE — ED Notes (Signed)
Patient transported to X-ray 

## 2018-02-08 NOTE — Discharge Instructions (Signed)
Your hip x-ray shows abnormalities including a small fracture around the screws and subluxation of the hip joint.  You will need to follow-up with your orthopedist tomorrow.  Call them in the morning for an appointment tomorrow on 9/19.

## 2018-02-08 NOTE — ED Notes (Signed)
Pt c/o generalized fatigue.

## 2018-02-09 MED ORDER — IBUPROFEN 400 MG PO TABS
600.0000 mg | ORAL_TABLET | Freq: Once | ORAL | Status: AC
  Administered 2018-02-09: 600 mg via ORAL
  Filled 2018-02-09: qty 1

## 2018-02-09 NOTE — ED Notes (Signed)
Pt stating the pain is tood bad in his L hip and he "can't go back there like this." Pt states he is afraid he will fall. EDP at bedside to talk with pt. Pt is to see orthopedist in the AM. New orders received and pt to be discharged.

## 2018-07-26 ENCOUNTER — Emergency Department (HOSPITAL_COMMUNITY)
Admission: EM | Admit: 2018-07-26 | Discharge: 2018-07-26 | Disposition: A | Discharge status: Home / Self Care | Payer: Medicaid Other | Attending: Emergency Medicine | Admitting: Emergency Medicine

## 2018-07-26 ENCOUNTER — Other Ambulatory Visit: Payer: Self-pay

## 2018-07-26 ENCOUNTER — Emergency Department (HOSPITAL_COMMUNITY): Payer: Medicaid Other

## 2018-07-26 ENCOUNTER — Encounter (HOSPITAL_COMMUNITY): Payer: Self-pay | Admitting: *Deleted

## 2018-07-26 DIAGNOSIS — Z79899 Other long term (current) drug therapy: Secondary | ICD-10-CM | POA: Diagnosis not present

## 2018-07-26 DIAGNOSIS — R531 Weakness: Secondary | ICD-10-CM | POA: Diagnosis present

## 2018-07-26 LAB — BASIC METABOLIC PANEL
Anion gap: 8 (ref 5–15)
BUN: 14 mg/dL (ref 6–20)
CALCIUM: 9.2 mg/dL (ref 8.9–10.3)
CHLORIDE: 101 mmol/L (ref 98–111)
CO2: 27 mmol/L (ref 22–32)
CREATININE: 1.02 mg/dL (ref 0.61–1.24)
GFR calc non Af Amer: >60 mL/min (ref >60)
Glucose, Bld: 120 mg/dL — ABNORMAL HIGH (ref 70–99)
Potassium: 3.7 mmol/L (ref 3.5–5.1)
Sodium: 136 mmol/L (ref 135–145)

## 2018-07-26 LAB — CBC WITH DIFFERENTIAL/PLATELET
Abs Immature Granulocytes: 0 10*3/uL (ref 0.00–0.07)
BASOS ABS: 0 10*3/uL (ref 0.0–0.1)
Basophils Relative: 1 %
EOS ABS: 0 10*3/uL (ref 0.0–0.5)
EOS PCT: 0 %
HEMATOCRIT: 40.7 % (ref 39.0–52.0)
HEMOGLOBIN: 13.1 g/dL (ref 13.0–17.0)
LYMPHS ABS: 0.7 10*3/uL (ref 0.7–4.0)
LYMPHS PCT: 20 %
MCH: 29.5 pg (ref 26.0–34.0)
MCHC: 32.2 g/dL (ref 30.0–36.0)
MCV: 91.7 fL (ref 80.0–100.0)
MONO ABS: 0.2 10*3/uL (ref 0.1–1.0)
MONOS PCT: 5 %
NRBC: 0 % (ref 0.0–0.2)
Neutro Abs: 2.7 10*3/uL (ref 1.7–7.7)
Neutrophils Relative %: 74 %
Platelets: 210 10*3/uL (ref 150–400)
RBC: 4.44 MIL/uL (ref 4.22–5.81)
RDW: 12.8 % (ref 11.5–15.5)
WBC: 3.7 10*3/uL — ABNORMAL LOW (ref 4.0–10.5)
nRBC: 0 /100 WBC

## 2018-07-26 LAB — VALPROIC ACID LEVEL: Valproic Acid Lvl: <10 ug/mL — ABNORMAL LOW (ref 50.0–100.0)

## 2018-07-26 IMAGING — CR DG CHEST 2V
2 series · 2 of 2 positions shown · non-contrast
Comparison: Chest radiograph [DATE]

CLINICAL DATA: Weakness for 1 hour.

EXAM:
CHEST - 2 VIEW

[chest lat]
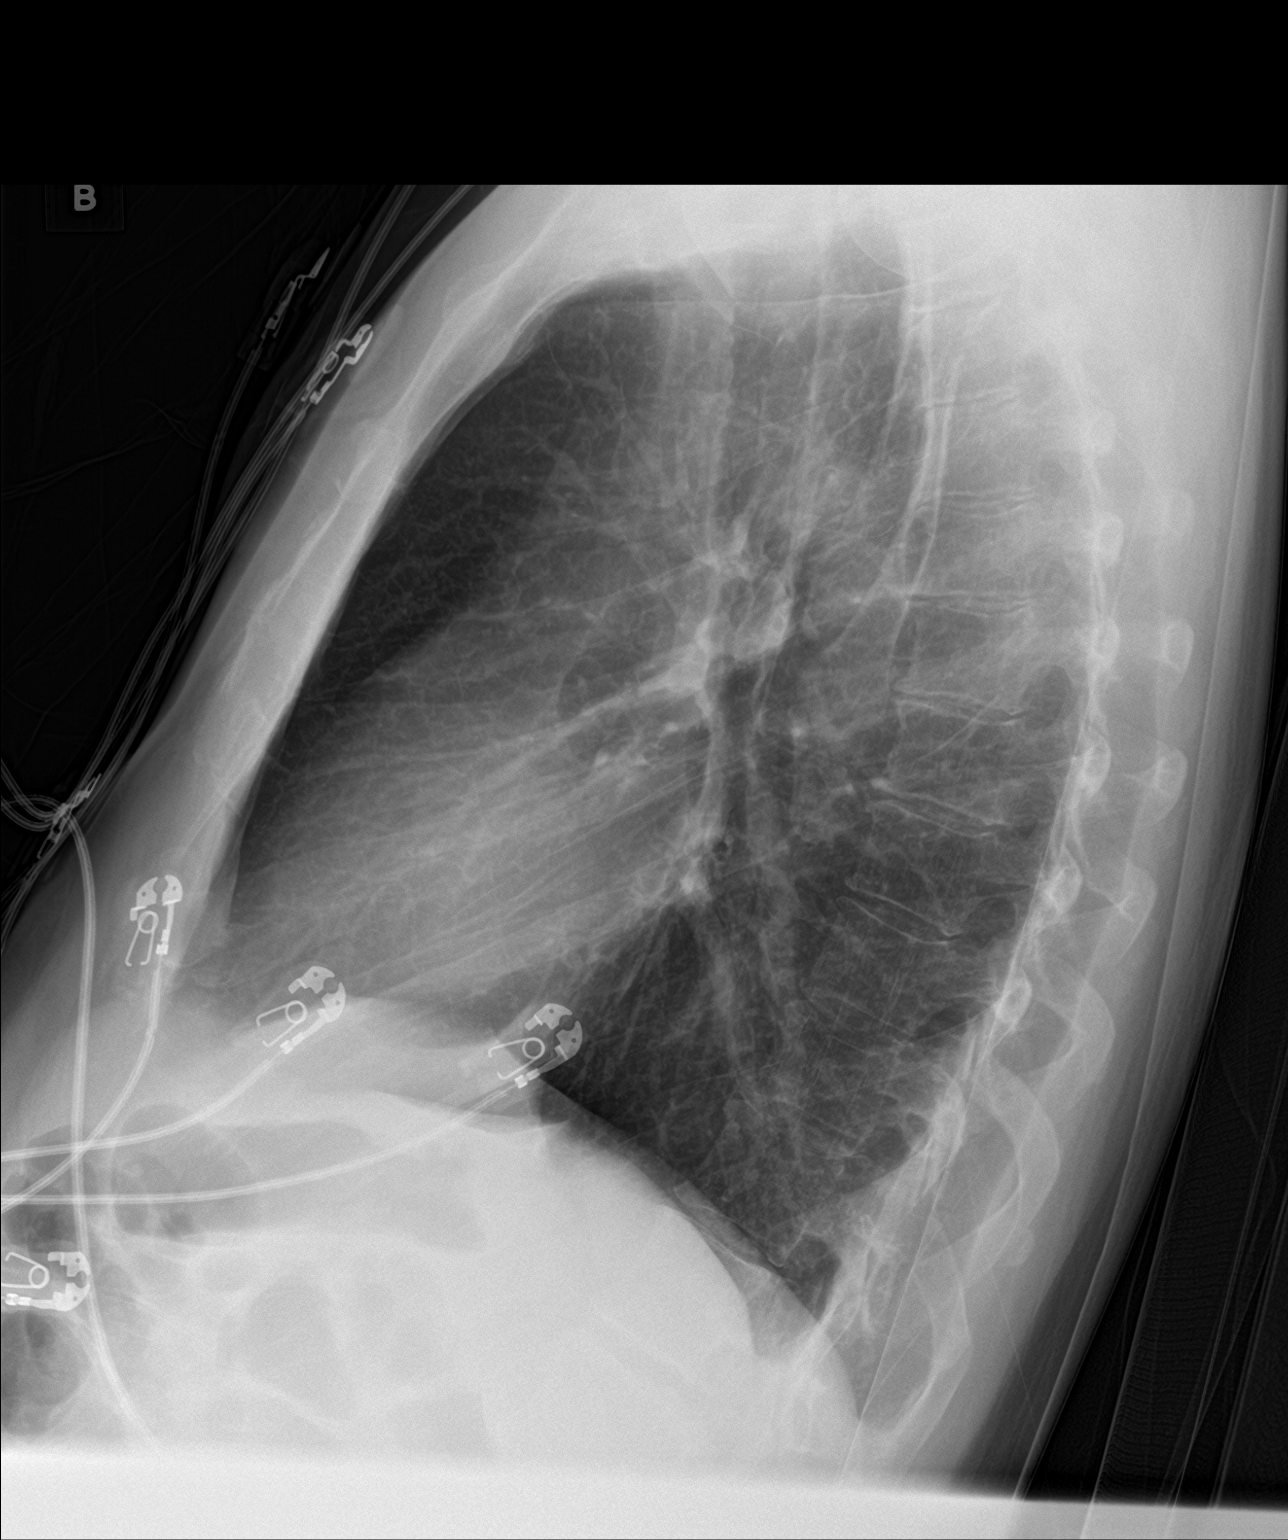

[chest ap]
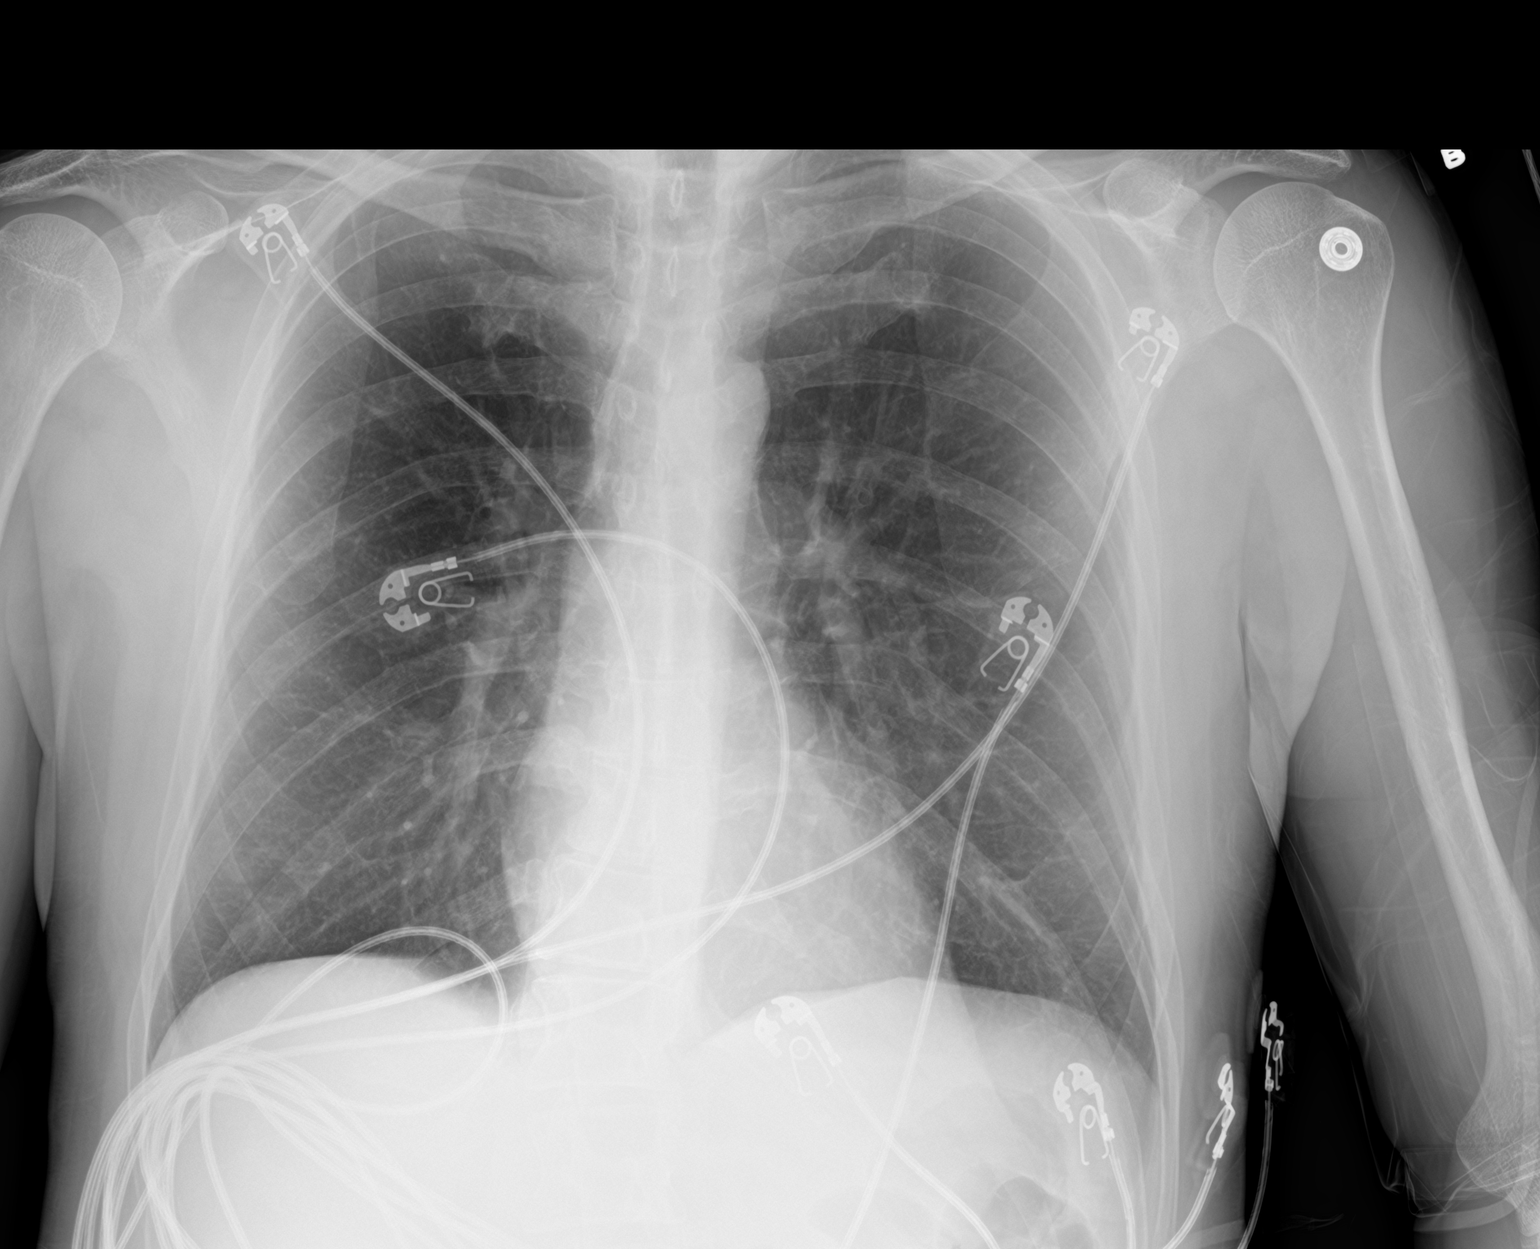

[2 of 2 positions shown; findings below may reference images not displayed]

FINDINGS: Cardiomediastinal silhouette is normal. No pleural effusions or
focal consolidations. Minimal biapical pleural thickening. Trachea
projects midline and there is no pneumothorax. Soft tissue planes
and included osseous structures are non-suspicious. Mild
thoracolumbar scoliosis.
IMPRESSION: Negative.

## 2018-07-26 MED ORDER — SODIUM CHLORIDE 0.9 % IV BOLUS
1000.0000 mL | Freq: Once | INTRAVENOUS | Status: AC
  Administered 2018-07-26: 1000 mL via INTRAVENOUS

## 2018-07-26 NOTE — Discharge Instructions (Addendum)
Continue your medications as previously prescribed.  Follow-up with your primary doctor if symptoms or not improving in the next few days, and return to the ER if symptoms significantly worsen or change.

## 2018-07-26 NOTE — ED Provider Notes (Addendum)
[facility] EMERGENCY DEPARTMENT Provider Note   CSN: [id number] Arrival date & time: 07/26/18  1624    History   Chief Complaint Chief Complaint  Patient presents with  . Weakness    HPI Gary [name] is a 53 y.o. male.     Patient is a 53 year old male with past medical history of bipolar disorder, depression, anxiety, GERD, and prior hip replacement.  He presents today for evaluation of weakness.  He states that he became acutely weak approximately 30 minutes ago where he felt as though he was going to "fall out".  He sat down and the symptoms improved somewhat.  He does report similar episodes over the past several weeks.  He is concerned he may be either dehydrated or malnourished.  He does report changing living arrangements recently and feels as though he may not have eaten as much as he normally does.  The history is provided by the patient.  Weakness  Severity:  Moderate Onset quality:  Sudden Progression:  Partially resolved Chronicity:  New Relieved by:  Nothing Worsened by:  Nothing Ineffective treatments:  None tried Associated symptoms: no shortness of breath and no stroke symptoms     Past Medical History:  Diagnosis Date  . Anxiety   . Bipolar 2 disorder (HCC)   . Depression   . GERD (gastroesophageal reflux disease)   . Hyperhydrosis disorder     Patient Active Problem List   Diagnosis Date Noted  . Severe recurrent major depression without psychotic features (HCC) 11/06/2015  . GAD (generalized anxiety disorder) 11/06/2015    Past Surgical History:  Procedure Laterality Date  . HERNIA REPAIR    . HIP SURGERY Right         Home Medications    Prior to Admission medications   Medication Sig Start Date End Date Taking? Authorizing Provider  benzonatate (TESSALON) 100 MG capsule Take 1 capsule (100 mg total) by mouth every 8 (eight) hours. 12/11/16   Donnetta Hutching, MD  benztropine (COGENTIN) 1 MG tablet Take 1 mg by mouth 2  (two) times daily.    [provider]  Cholecalciferol (VITAMIN D3) 5000 units TABS Take 5,000 Units by mouth daily.     [provider]  dicyclomine (BENTYL) 20 MG tablet Take 10 mg by mouth daily as needed (stomach spasms).     [provider]  divalproex (DEPAKOTE ER) 250 MG 24 hr tablet Take 250 mg by mouth 2 (two) times daily.    [provider]  famotidine (PEPCID) 20 MG tablet Take 20 mg by mouth at bedtime as needed for heartburn or indigestion.     [provider]  FLUoxetine (PROZAC) 40 MG capsule Take 40 mg by mouth daily.    [provider]  gabapentin (NEURONTIN) 300 MG capsule Take 300 mg by mouth 2 (two) times daily.     [provider]  pantoprazole (PROTONIX) 40 MG tablet Take 40 mg by mouth daily.    [provider]    Family History No family history on file.  Social History Social History   Tobacco Use  . Smoking status: Never Smoker  . Smokeless tobacco: Never Used  Substance Use Topics  . Alcohol use: No  . Drug use: No     Allergies   Bee venom and Erythromycin   Review of Systems Review of Systems  Respiratory: Negative for shortness of breath.   Neurological: Positive for weakness.  All other systems reviewed and  are negative.    Physical Exam Updated Vital Signs BP (!) 146/91 (BP Location: Right Arm)   Pulse 71   Temp 98.6 F (37 C) (Oral)   Resp 16   Ht 5\' 3"  (1.6 m)   Wt 62.6 kg   SpO2 100%   BMI 24.45 kg/m   Physical Exam Vitals signs and nursing note reviewed.  Constitutional:      General: He is not in acute distress.    Appearance: He is well-developed. He is not diaphoretic.  HENT:     Head: Normocephalic and atraumatic.     Mouth/Throat:     Mouth: Mucous membranes are moist.  Neck:     Musculoskeletal: Normal range of motion and neck supple.  Cardiovascular:     Rate and Rhythm: Normal rate and regular rhythm.     Heart sounds: No murmur. No  friction rub.  Pulmonary:     Effort: Pulmonary effort is normal. No respiratory distress.     Breath sounds: Normal breath sounds. No wheezing or rales.  Abdominal:     General: Bowel sounds are normal. There is no distension.     Palpations: Abdomen is soft.     Tenderness: There is no abdominal tenderness.  Musculoskeletal: Normal range of motion.  Skin:    General: Skin is warm and dry.  Neurological:     General: No focal deficit present.     Mental Status: He is alert and oriented to person, place, and time.     Cranial Nerves: No cranial nerve deficit.     Sensory: No sensory deficit.     Motor: No weakness.     Coordination: Coordination normal.      ED Treatments / Results  Labs (all labs ordered are listed, but only abnormal results are displayed) Labs Reviewed  BASIC METABOLIC PANEL  CBC WITH DIFFERENTIAL/PLATELET  VALPROIC ACID LEVEL    EKG EKG Interpretation  Date/Time:  Wednesday July 26 2018 16:27:54 EST Ventricular Rate:  70 PR Interval:    QRS Duration: 92 QT Interval:  407 QTC Calculation: 440 R Axis:   83 Text Interpretation:  Sinus rhythm Borderline short PR interval Confirmed by Gary [name], Gary [name] ([id number]) on 07/26/2018 4:31:33 PM   Radiology No results found.  Procedures Procedures (including critical care time)  Medications Ordered in ED Medications  sodium chloride 0.9 % bolus 1,000 mL (has no administration in time range)     Initial Impression / Assessment and Plan / ED Course  I have reviewed the triage vital signs and the nursing notes.  Pertinent labs & imaging results that were available during my care of the patient were reviewed by me and considered in my medical decision making (see chart for details).  Patient is a 53 year old male with history of bipolar disorder, depression, and anxiety.  He presents today with complaints of weakness because of which I am uncertain.  His vital signs are stable, he appears well-hydrated, and  physical examination is nonfocal.  His work-up shows no significant anemia, electrolyte abnormalities, or EKG changes.  His chest x-ray is clear.  At this point, I see nothing that appears emergent and believe the patient is appropriate for discharge.  He was hydrated with 1 L of normal saline here as he states his appetite has been somewhat less recently.  Final Clinical Impressions(s) / ED Diagnoses   Final diagnoses:  None    ED Discharge Orders    None       Kenetra Hildenbrand,  Riley Lam, MD 07/26/18 Daphane Shepherd    Gary Lyons, MD 07/26/18 [phone]

## 2018-07-26 NOTE — ED Triage Notes (Signed)
The pt arrived by [facility] from home he just moved by himself to an apartment.  C/o leg weakness for over a week  For one hour more weakness in both legs  Lives alone  Alert oriented skin warm and dry  No distress`    Walks with a cane

## 2018-07-26 NOTE — ED Notes (Signed)
Pt transported to xray 

## 2018-08-03 ENCOUNTER — Encounter (HOSPITAL_COMMUNITY): Payer: Self-pay | Admitting: Emergency Medicine

## 2018-08-03 ENCOUNTER — Emergency Department (HOSPITAL_COMMUNITY)
Admission: EM | Admit: 2018-08-03 | Discharge: 2018-08-03 | Disposition: A | Discharge status: Home / Self Care | Payer: Medicaid Other | Attending: Emergency Medicine | Admitting: Emergency Medicine

## 2018-08-03 ENCOUNTER — Other Ambulatory Visit: Payer: Self-pay

## 2018-08-03 ENCOUNTER — Emergency Department (HOSPITAL_COMMUNITY): Payer: Medicaid Other

## 2018-08-03 DIAGNOSIS — R531 Weakness: Secondary | ICD-10-CM | POA: Diagnosis not present

## 2018-08-03 DIAGNOSIS — Z79899 Other long term (current) drug therapy: Secondary | ICD-10-CM | POA: Insufficient documentation

## 2018-08-03 DIAGNOSIS — E860 Dehydration: Secondary | ICD-10-CM | POA: Insufficient documentation

## 2018-08-03 LAB — URINALYSIS, ROUTINE W REFLEX MICROSCOPIC
Bilirubin Urine: NEGATIVE
Glucose, UA: NEGATIVE mg/dL
Hgb urine dipstick: NEGATIVE
Ketones, ur: 5 mg/dL — AB
Leukocytes,Ua: NEGATIVE
Nitrite: NEGATIVE
Protein, ur: 30 mg/dL — AB
Specific Gravity, Urine: 1.026 (ref 1.005–1.030)
pH: 6 (ref 5.0–8.0)

## 2018-08-03 LAB — CBC
HCT: 40.2 % (ref 39.0–52.0)
Hemoglobin: 13.3 g/dL (ref 13.0–17.0)
MCH: 30.7 pg (ref 26.0–34.0)
MCHC: 33.1 g/dL (ref 30.0–36.0)
MCV: 92.8 fL (ref 80.0–100.0)
PLATELETS: 275 10*3/uL (ref 150–400)
RBC: 4.33 MIL/uL (ref 4.22–5.81)
RDW: 13.1 % (ref 11.5–15.5)
WBC: 5.2 10*3/uL (ref 4.0–10.5)
nRBC: 0 % (ref 0.0–0.2)

## 2018-08-03 LAB — BASIC METABOLIC PANEL
Anion gap: 8 (ref 5–15)
BUN: 17 mg/dL (ref 6–20)
CALCIUM: 9.1 mg/dL (ref 8.9–10.3)
CO2: 28 mmol/L (ref 22–32)
CREATININE: 1.32 mg/dL — AB (ref 0.61–1.24)
Chloride: 101 mmol/L (ref 98–111)
GFR calc non Af Amer: >60 mL/min (ref >60)
GLUCOSE: 119 mg/dL — AB (ref 70–99)
Potassium: 4.3 mmol/L (ref 3.5–5.1)
Sodium: 137 mmol/L (ref 135–145)

## 2018-08-03 LAB — I-STAT TROPONIN, ED: Troponin i, poc: 0 ng/mL (ref 0.00–0.08)

## 2018-08-03 IMAGING — CR CHEST - 2 VIEW
2 series · 2 of 2 positions shown · non-contrast
Comparison: [DATE]

CLINICAL DATA: Dizziness.  No chest complaints.

EXAM:
CHEST - 2 VIEW

[chest pa]
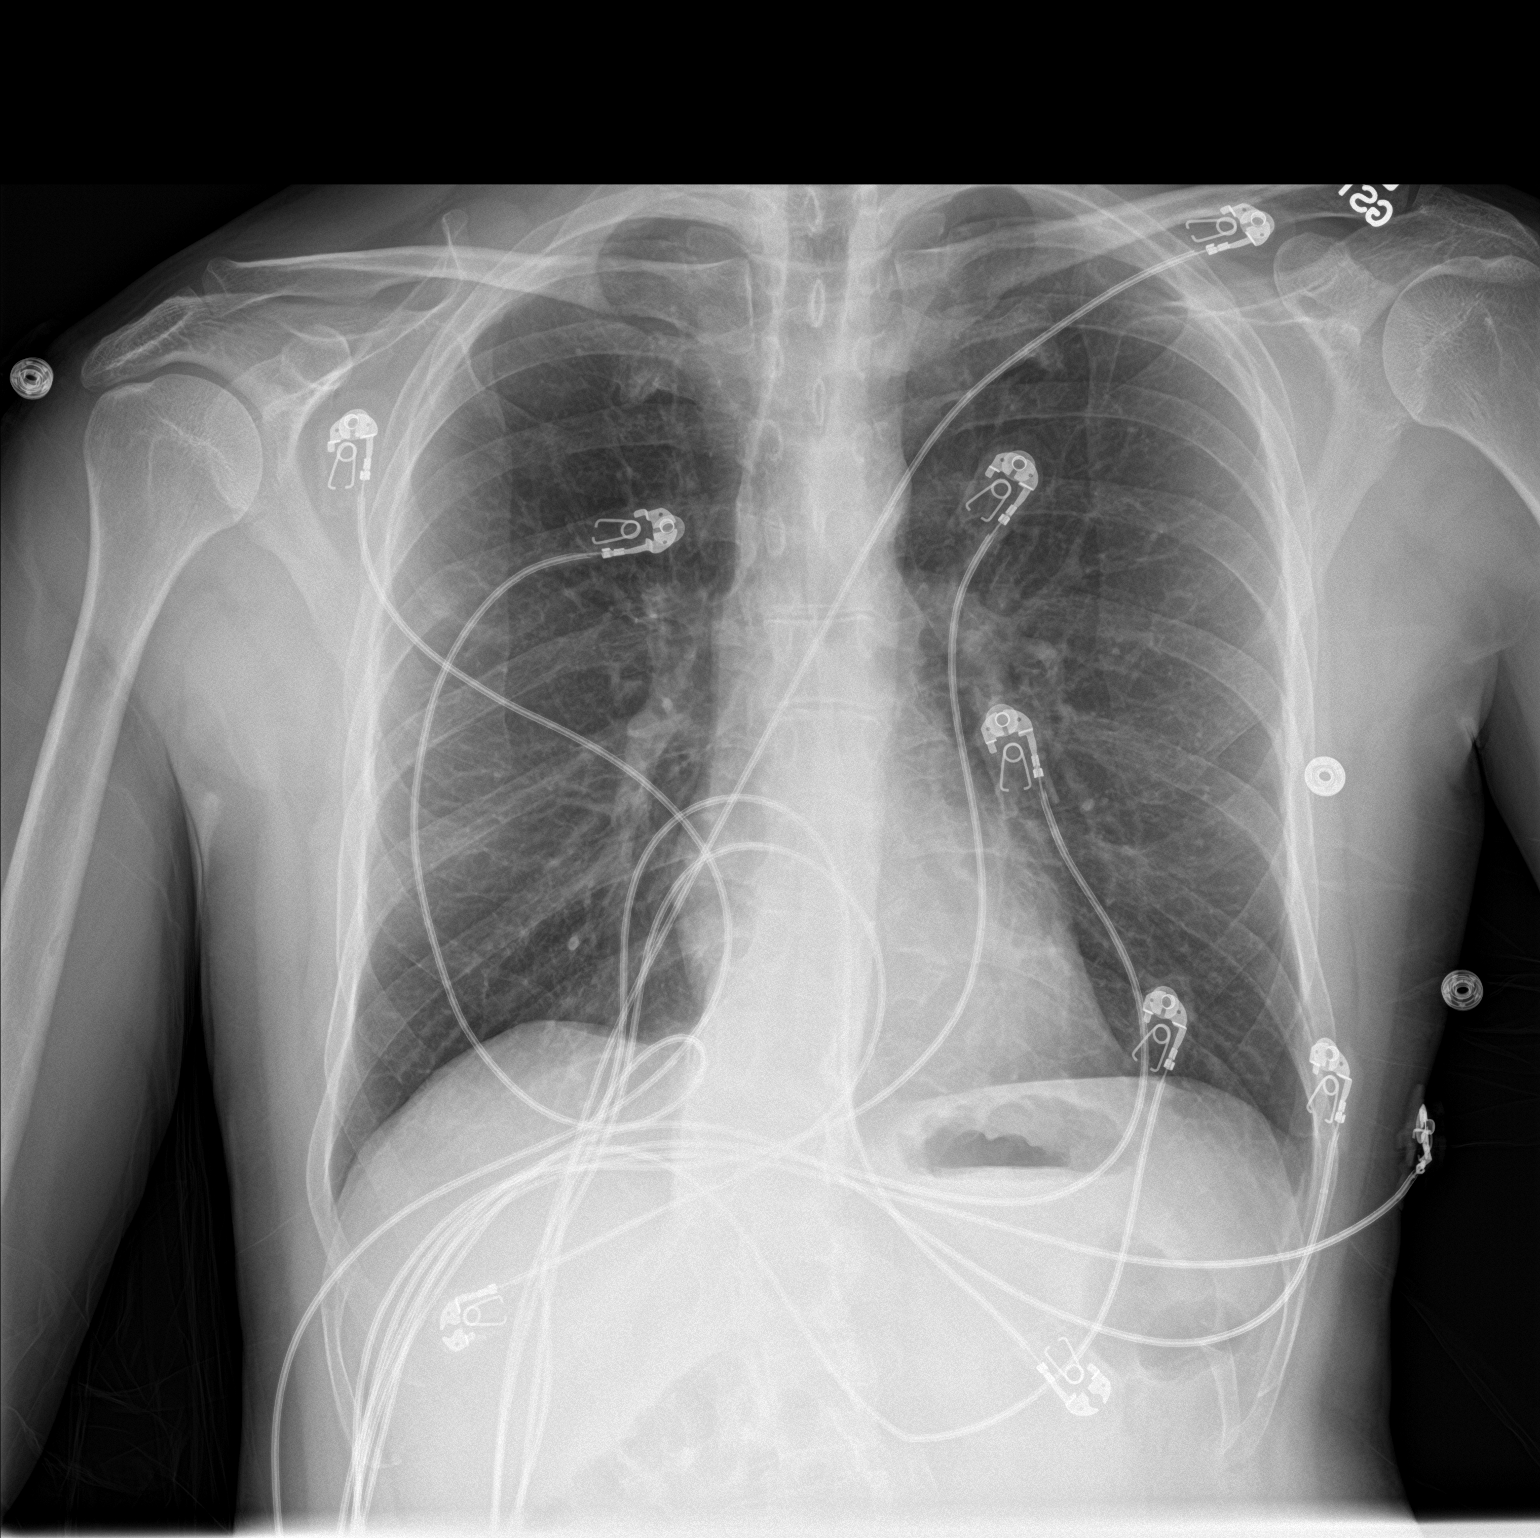

[chest lat]
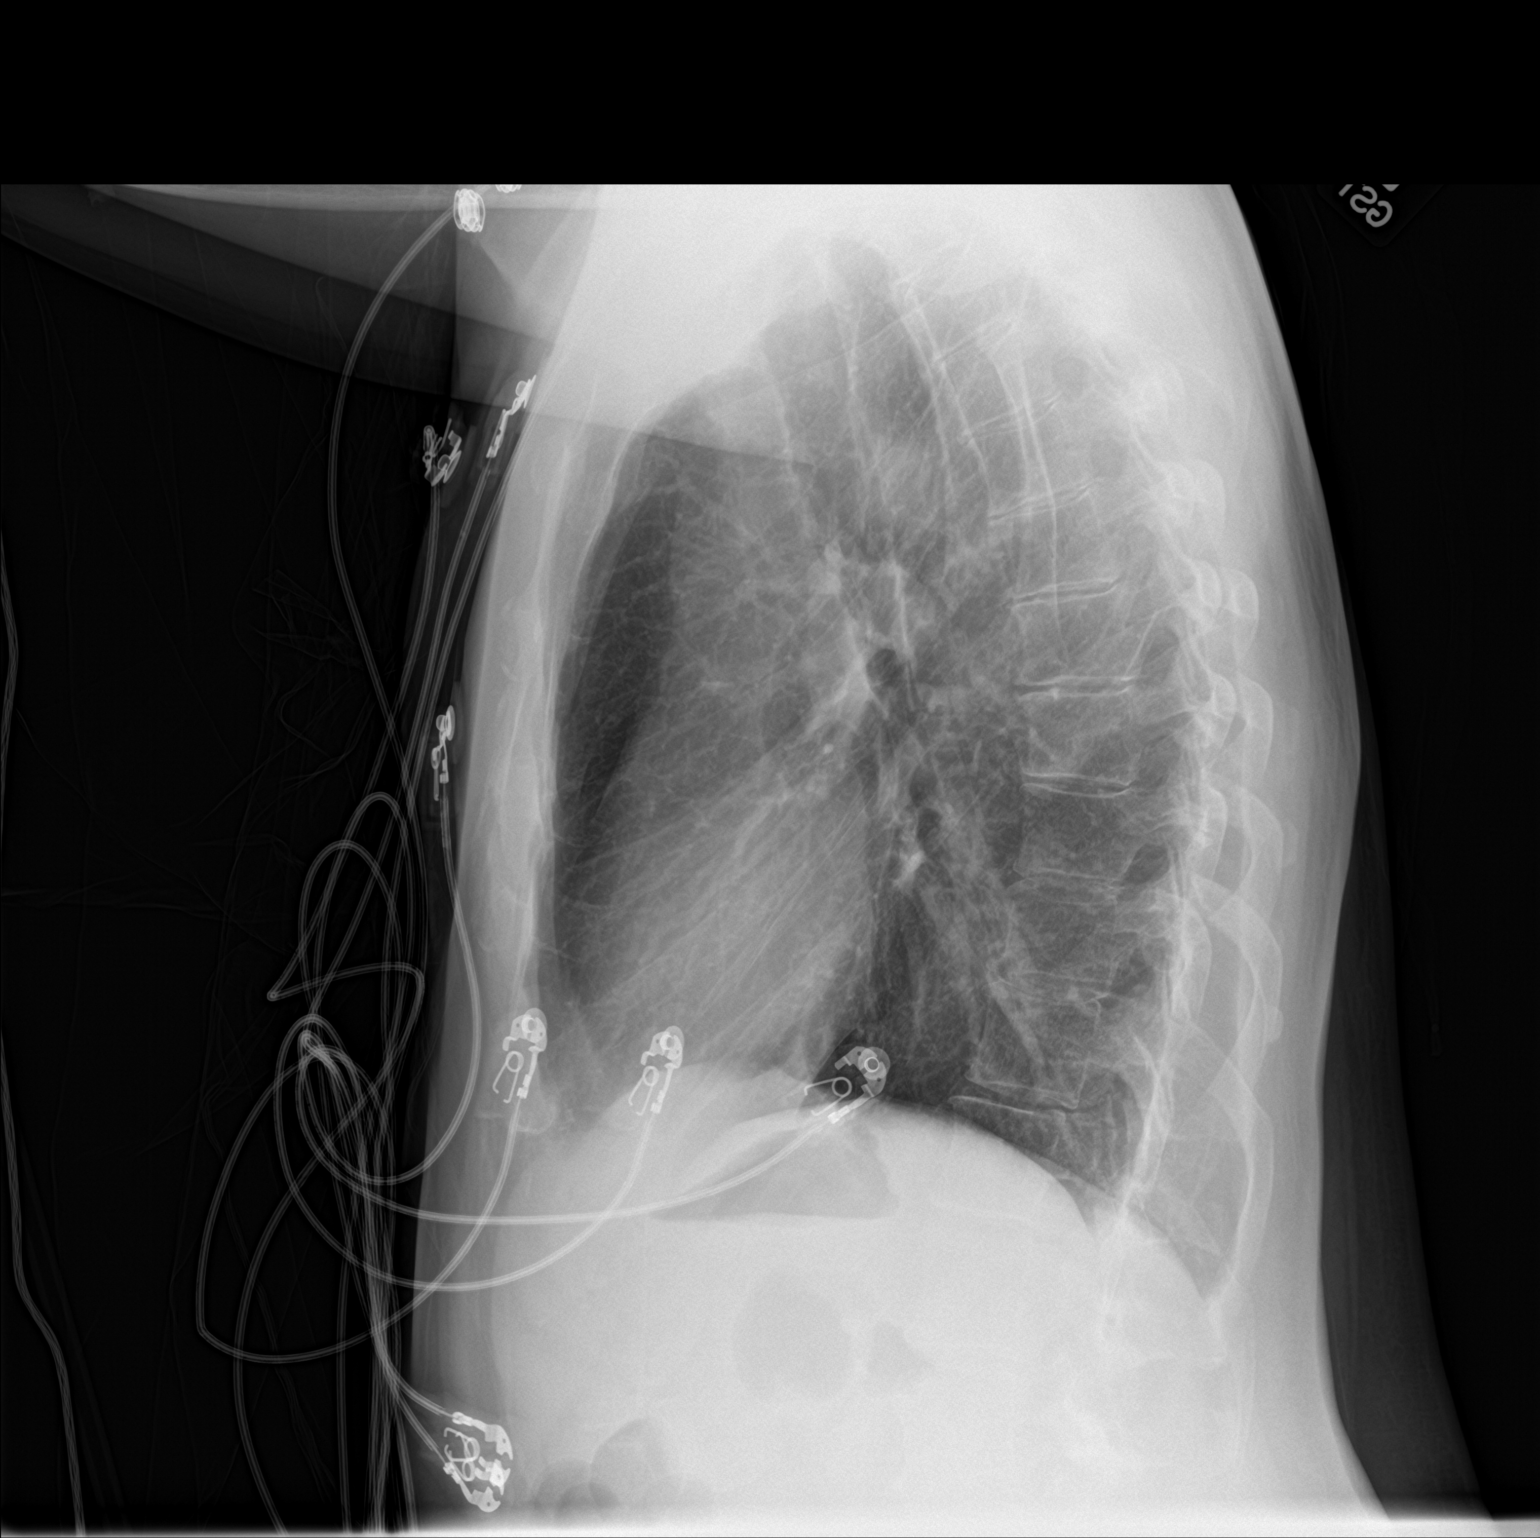

[2 of 2 positions shown; findings below may reference images not displayed]

FINDINGS: The heart size and mediastinal contours are within normal limits.
Both lungs are clear. The visualized skeletal structures are
unremarkable.
IMPRESSION: No active cardiopulmonary disease.

## 2018-08-03 NOTE — ED Provider Notes (Signed)
[facility] EMERGENCY DEPARTMENT Provider Note   CSN: [id number] Arrival date & time: 08/03/18  0051    History   Chief Complaint Chief Complaint  Patient presents with  . Chest Pain    HPI Gary [name] is a 53 y.o. male.     The history is provided by the patient.  Weakness  Severity:  Moderate Onset quality:  Gradual Timing:  Constant Progression:  Unchanged Chronicity:  Recurrent Relieved by: activity. Worsened by:  Nothing Associated symptoms: no fever   Reports ongoing worsening generalized weakness He reports when he stands up he feels lightheaded.  No syncope.  He also reports nonspecific chest pressure.  No shortness of breath He reports his main concern is his generalized weakness and lack of appetite.  No new medicines.  Past Medical History:  Diagnosis Date  . Anxiety   . Bipolar 2 disorder (HCC)   . Depression   . GERD (gastroesophageal reflux disease)   . Hyperhydrosis disorder     Patient Active Problem List   Diagnosis Date Noted  . Severe recurrent major depression without psychotic features (HCC) 11/06/2015  . GAD (generalized anxiety disorder) 11/06/2015    Past Surgical History:  Procedure Laterality Date  . HERNIA REPAIR    . HIP SURGERY Right         Home Medications    Prior to Admission medications   Medication Sig Start Date End Date Taking? Authorizing Provider  benzonatate (TESSALON) 100 MG capsule Take 1 capsule (100 mg total) by mouth every 8 (eight) hours. Patient not taking: Reported on 07/26/2018 12/11/16   Donnetta Hutching, MD  cholecalciferol (VITAMIN D3) 25 MCG (1000 UT) tablet Take 1,000 Units by mouth every morning.    [provider]  dicyclomine (BENTYL) 10 MG capsule Take 10 mg by mouth every morning.    [provider]  FLUoxetine (PROZAC) 40 MG capsule Take 40 mg by mouth every morning.    [provider]  gabapentin (NEURONTIN) 400 MG capsule Take 400 mg by mouth 2 (two)  times daily. Morning and evening    [provider]  OLANZapine (ZYPREXA) 5 MG tablet Take 5 mg by mouth at bedtime.    [provider]  ranitidine (ZANTAC) 150 MG capsule Take 150 mg by mouth 2 (two) times daily. Morning and evening    [provider]  tamsulosin (FLOMAX) 0.4 MG CAPS capsule Take 0.4 mg by mouth every morning.    [provider]    Family History History reviewed. No pertinent family history.  Social History Social History   Tobacco Use  . Smoking status: Never Smoker  . Smokeless tobacco: Never Used  Substance Use Topics  . Alcohol use: No  . Drug use: No     Allergies   Bee venom and Erythromycin   Review of Systems Review of Systems  Constitutional: Positive for fatigue. Negative for fever.  Neurological: Positive for weakness. Negative for syncope.  Psychiatric/Behavioral: Negative for suicidal ideas.  All other systems reviewed and are negative.    Physical Exam Updated Vital Signs BP (!) 129/93   Pulse 77   Temp 98.4 F (36.9 C) (Oral)   Resp 19   Ht 1.6 m (5\' 3" )   Wt 62.6 kg   SpO2 98%   BMI 24.45 kg/m   Physical Exam  CONSTITUTIONAL: Well developed/well nourished HEAD: Normocephalic/atraumatic EYES: EOMI/PERRL ENMT: Mucous membranes moist NECK: supple no meningeal signs SPINE/BACK:entire spine nontender CV: S1/S2 noted,  no murmurs/rubs/gallops noted LUNGS: Lungs are clear to auscultation bilaterally, no apparent distress ABDOMEN: soft, nontender, no rebound or guarding, bowel sounds noted throughout abdomen GU:no cva tenderness NEURO: Pt is awake/alert/appropriate, moves all extremitiesx4.  No facial droop.   EXTREMITIES: pulses normal/equal, full ROM SKIN: Pale PSYCH: flat affect   ED Treatments / Results  Labs (all labs ordered are listed, but only abnormal results are displayed) Labs Reviewed  BASIC METABOLIC PANEL - Abnormal; Notable for the following components:      Result Value    Glucose, Bld 119 (*)    Creatinine, Ser 1.32 (*)    All other components within normal limits  URINALYSIS, ROUTINE W REFLEX MICROSCOPIC - Abnormal; Notable for the following components:   APPearance HAZY (*)    Ketones, ur 5 (*)    Protein, ur 30 (*)    Bacteria, UA RARE (*)    All other components within normal limits  CBC  I-STAT TROPONIN, ED    EKG EKG Interpretation  Date/Time:  Thursday August 03 2018 00:55:51 EDT Ventricular Rate:  85 PR Interval:    QRS Duration: 86 QT Interval:  369 QTC Calculation: 439 R Axis:   67 Text Interpretation:  Sinus rhythm Short PR interval No significant change since last tracing Confirmed by Zadie Rhine ([id number]) on 08/03/2018 1:27:52 AM   Radiology Dg Chest 2 View  Result Date: 08/03/2018 CLINICAL DATA:  Dizziness.  No chest complaints. EXAM: CHEST - 2 VIEW COMPARISON:  07/26/2018 FINDINGS: The heart size and mediastinal contours are within normal limits. Both lungs are clear. The visualized skeletal structures are unremarkable. IMPRESSION: No active cardiopulmonary disease. Electronically Signed   By: Burman Nieves M.D.   On: 08/03/2018 01:19    Procedures Procedures  Medications Ordered in ED Medications - No data to display   Initial Impression / Assessment and Plan / ED Course  I have reviewed the triage vital signs and the nursing notes.  Pertinent labs & imaging results that were available during my care of the patient were reviewed by me and considered in my medical decision making (see chart for details).        Ports his main issue is just feeling generalized weakness.  He reports he has been staying in a rehab facility and is now on his own.  He reports he feels safe and denies SI, but reports he feels he needs help at home.  He apparently has not received his psychiatric medications and has not had much appetite. He is amenable to home health.  He had a similar ER visit earlier this month for similar symptoms.   He does appear mildly dehydrated and was given IV fluids. 6:41 AM Labs reveal dehydration.  Patient now reports he has been having weakness since last year after his surgery.  He denies any syncope.  He walked in the ER with minimal assistance.  He has a shuffling gait but is very stable.  He has no focal weakness. Reports that the local behavioral health center did not ship his medications. Consulted case management and social work to assist with medications as well as home health. Overall I feel most of his issues are chronic in nature and not require further ongoing management or admission He was given 2 L normal saline Final Clinical Impressions(s) / ED Diagnoses   Final diagnoses:  Weakness  Dehydration    ED Discharge Orders    None       Zadie Rhine, MD 08/03/18 [phone]

## 2018-08-03 NOTE — ED Notes (Signed)
Urinalysis sent to lab with downtime orders.

## 2018-08-03 NOTE — Progress Notes (Addendum)
CSW provided RN with taxi for pt at this time. RNCM and CSW currently working on pt's medication needs.   Claude Manges Naheem Mosco, MSW, LCSW-A Emergency Department Clinical Social Worker [phone]

## 2018-08-03 NOTE — Discharge Planning (Signed)
EDCM contacted pt ACT team RN Lurena Joiner to find that Rx HAS been delivered to pt and he should not have run out by now.  Lurena Joiner states she will make a home visit to pt today.

## 2018-08-03 NOTE — ED Notes (Signed)
SW confirmed that pt had his medicine from [facility] delivered to his door- his nurse is going to see him today. Cab voucher given.

## 2018-08-03 NOTE — Discharge Instructions (Signed)

## 2018-08-03 NOTE — ED Notes (Signed)
Patient not able to ride the bus home and does not have anyone he can call to come pick him up. Patient transferred to progressive bed and social work will be asked to provide a taxi voucher upon their arrival at 0800 this morning. Will notify day shift RN on plan of care. Patient otherwise informed of discharge instructions, questions answered.

## 2018-08-03 NOTE — ED Notes (Signed)
Patient transported to X-ray 

## 2018-08-03 NOTE — ED Triage Notes (Signed)
Per [facility], Pt reports weakness to legs. Pt reports chest pressure non-radiating, 4/10. Pt denies SHOB, N/V. Pt reports he is out of psych and anxiety meds, [facility] did not deliver. Pt received 324 of ASA and 2 nitro en route, decreased pain to 1 or 2/10. Pt reports feeling anxious.

## 2018-08-10 ENCOUNTER — Other Ambulatory Visit: Payer: Self-pay

## 2018-08-10 ENCOUNTER — Emergency Department (HOSPITAL_COMMUNITY)
Admission: EM | Admit: 2018-08-10 | Discharge: 2018-08-11 | Discharge status: Against Medical Advice | Payer: Medicaid Other | Source: Home / Self Care

## 2018-08-10 ENCOUNTER — Encounter (HOSPITAL_COMMUNITY): Payer: Self-pay

## 2018-08-10 ENCOUNTER — Emergency Department (HOSPITAL_COMMUNITY)
Admission: EM | Admit: 2018-08-10 | Discharge: 2018-08-10 | Disposition: A | Discharge status: Home / Self Care | Payer: Medicaid Other | Attending: Emergency Medicine | Admitting: Emergency Medicine

## 2018-08-10 DIAGNOSIS — R42 Dizziness and giddiness: Secondary | ICD-10-CM | POA: Insufficient documentation

## 2018-08-10 DIAGNOSIS — Z5321 Procedure and treatment not carried out due to patient leaving prior to being seen by health care provider: Secondary | ICD-10-CM

## 2018-08-10 DIAGNOSIS — R531 Weakness: Secondary | ICD-10-CM | POA: Insufficient documentation

## 2018-08-10 DIAGNOSIS — R55 Syncope and collapse: Secondary | ICD-10-CM | POA: Insufficient documentation

## 2018-08-10 DIAGNOSIS — Z79899 Other long term (current) drug therapy: Secondary | ICD-10-CM | POA: Diagnosis not present

## 2018-08-10 LAB — URINALYSIS, ROUTINE W REFLEX MICROSCOPIC
Bilirubin Urine: NEGATIVE
Glucose, UA: NEGATIVE mg/dL
Hgb urine dipstick: NEGATIVE
Ketones, ur: NEGATIVE mg/dL
LEUKOCYTE UA: NEGATIVE
Nitrite: NEGATIVE
Protein, ur: NEGATIVE mg/dL
Specific Gravity, Urine: 1.009 (ref 1.005–1.030)
pH: 6 (ref 5.0–8.0)

## 2018-08-10 LAB — COMPREHENSIVE METABOLIC PANEL
ALBUMIN: 4 g/dL (ref 3.5–5.0)
ALT: 17 U/L (ref 0–44)
AST: 20 U/L (ref 15–41)
Alkaline Phosphatase: 117 U/L (ref 38–126)
Anion gap: 8 (ref 5–15)
BILIRUBIN TOTAL: 0.6 mg/dL (ref 0.3–1.2)
BUN: 7 mg/dL (ref 6–20)
CO2: 27 mmol/L (ref 22–32)
Calcium: 9.6 mg/dL (ref 8.9–10.3)
Chloride: 103 mmol/L (ref 98–111)
Creatinine, Ser: 0.96 mg/dL (ref 0.61–1.24)
GFR calc Af Amer: >60 mL/min (ref >60)
GFR calc non Af Amer: >60 mL/min (ref >60)
GLUCOSE: 94 mg/dL (ref 70–99)
Potassium: 3.7 mmol/L (ref 3.5–5.1)
Sodium: 138 mmol/L (ref 135–145)
TOTAL PROTEIN: 6.9 g/dL (ref 6.5–8.1)

## 2018-08-10 LAB — CBC WITH DIFFERENTIAL/PLATELET
Abs Immature Granulocytes: 0.01 10*3/uL (ref 0.00–0.07)
Basophils Absolute: 0 10*3/uL (ref 0.0–0.1)
Basophils Relative: 0 %
Eosinophils Absolute: 0 10*3/uL (ref 0.0–0.5)
Eosinophils Relative: 1 %
HEMATOCRIT: 41.3 % (ref 39.0–52.0)
Hemoglobin: 13.6 g/dL (ref 13.0–17.0)
IMMATURE GRANULOCYTES: 0 %
LYMPHS ABS: 1.3 10*3/uL (ref 0.7–4.0)
Lymphocytes Relative: 28 %
MCH: 29.7 pg (ref 26.0–34.0)
MCHC: 32.9 g/dL (ref 30.0–36.0)
MCV: 90.2 fL (ref 80.0–100.0)
Monocytes Absolute: 0.3 10*3/uL (ref 0.1–1.0)
Monocytes Relative: 7 %
NRBC: 0 % (ref 0.0–0.2)
Neutro Abs: 3 10*3/uL (ref 1.7–7.7)
Neutrophils Relative %: 64 %
Platelets: 261 10*3/uL (ref 150–400)
RBC: 4.58 MIL/uL (ref 4.22–5.81)
RDW: 13 % (ref 11.5–15.5)
WBC: 4.7 10*3/uL (ref 4.0–10.5)

## 2018-08-10 LAB — I-STAT TROPONIN, ED: Troponin i, poc: 0.01 ng/mL (ref 0.00–0.08)

## 2018-08-10 LAB — D-DIMER, QUANTITATIVE: D-Dimer, Quant: 0.32 ug/mL-FEU (ref 0.00–0.50)

## 2018-08-10 MED ORDER — SODIUM CHLORIDE 0.9 % IV BOLUS
1000.0000 mL | Freq: Once | INTRAVENOUS | Status: AC
  Administered 2018-08-10: 1000 mL via INTRAVENOUS

## 2018-08-10 NOTE — ED Provider Notes (Signed)
[facility] EMERGENCY DEPARTMENT Provider Note   CSN: [id number] Arrival date & time: 08/10/18  1923    History   Chief Complaint Chief Complaint  Patient presents with   Weakness   Dizziness    HPI Gary [name] is a 53 y.o. male.     HPI  2 episodes of near-syncope 11/25 hip replacement, had rehab and surgery done at [facility] Legs have been weak since surgery And today while watching TV felt like going to faint and feeling increased weakness Felt like going to faint No chest pain, ho shortness of breath, but does feel chest tightness No pain no difficulty taking deep breath  No palpitations or heart racing No fevers, no cough, no headache, no leg swelling or pain "Not eating like I should" Trying to live by self, and has put a lot of strain trying to do it and thinks this must be contributing Still weak from surgery and living by self.  Works with ACT team, were supposed to bring meds, and didn't bring the medications. Have had some medications. Should have brought them today.  Havent taken gabapentin, zantac today  Past Medical History:  Diagnosis Date   Anxiety    Bipolar 2 disorder (HCC)    Depression    GERD (gastroesophageal reflux disease)    Hyperhydrosis disorder     Patient Active Problem List   Diagnosis Date Noted   Severe recurrent major depression without psychotic features (HCC) 11/06/2015   GAD (generalized anxiety disorder) 11/06/2015    Past Surgical History:  Procedure Laterality Date   HERNIA REPAIR     HIP SURGERY Right         Home Medications    Prior to Admission medications   Medication Sig Start Date End Date Taking? Authorizing Provider  cholecalciferol (VITAMIN D3) 25 MCG (1000 UT) tablet Take 1,000 Units by mouth every morning.   Yes [provider]  dicyclomine (BENTYL) 10 MG capsule Take 10 mg by mouth every morning.   Yes [provider]  FLUoxetine (PROZAC) 40 MG  capsule Take 40 mg by mouth every morning.   Yes [provider]  gabapentin (NEURONTIN) 400 MG capsule Take 400 mg by mouth 2 (two) times daily. Morning and evening   Yes [provider]  OLANZapine (ZYPREXA) 5 MG tablet Take 5 mg by mouth at bedtime.   Yes [provider]  ranitidine (ZANTAC) 150 MG capsule Take 150 mg by mouth 2 (two) times daily. Morning and evening   Yes [provider]  tamsulosin (FLOMAX) 0.4 MG CAPS capsule Take 0.4 mg by mouth every morning.   Yes [provider]    Family History History reviewed. No pertinent family history.  Social History Social History   Tobacco Use   Smoking status: Never Smoker   Smokeless tobacco: Never Used  Substance Use Topics   Alcohol use: No   Drug use: No     Allergies   Bee venom and Erythromycin   Review of Systems Review of Systems  Constitutional: Positive for fatigue. Negative for fever.  HENT: Negative for sore throat.   Eyes: Negative for visual disturbance.  Respiratory: Positive for chest tightness. Negative for cough and shortness of breath.   Cardiovascular: Negative for chest pain.  Gastrointestinal: Negative for abdominal pain, diarrhea, nausea and vomiting.  Genitourinary: Negative for difficulty urinating and dysuria.  Musculoskeletal: Negative for back pain and neck stiffness.  Skin: Negative for rash.  Neurological: Positive  for light-headedness. Negative for syncope, facial asymmetry, weakness, numbness and headaches.     Physical Exam Updated Vital Signs BP 133/66 (BP Location: Right Arm)    Pulse 70    Temp 98.2 F (36.8 C) (Oral)    Resp 16    Ht 5\' 3"  (1.6 m)    Wt 62.5 kg    SpO2 100%    BMI 24.41 kg/m   Physical Exam Vitals signs and nursing note reviewed.  Constitutional:      General: He is not in acute distress.    Appearance: He is well-developed. He is not diaphoretic.  HENT:     Head: Normocephalic and atraumatic.  Eyes:      Conjunctiva/sclera: Conjunctivae normal.  Neck:     Musculoskeletal: Normal range of motion.  Cardiovascular:     Rate and Rhythm: Normal rate and regular rhythm.     Heart sounds: Normal heart sounds. No murmur. No friction rub. No gallop.   Pulmonary:     Effort: Pulmonary effort is normal. No respiratory distress.     Breath sounds: Normal breath sounds. No wheezing or rales.  Abdominal:     General: There is no distension.     Palpations: Abdomen is soft.     Tenderness: There is no abdominal tenderness. There is no guarding.  Skin:    General: Skin is warm and dry.  Neurological:     Mental Status: He is alert and oriented to person, place, and time.      ED Treatments / Results  Labs (all labs ordered are listed, but only abnormal results are displayed) Labs Reviewed  CBC WITH DIFFERENTIAL/PLATELET  COMPREHENSIVE METABOLIC PANEL  D-DIMER, QUANTITATIVE (NOT AT [facility])  URINALYSIS, ROUTINE W REFLEX MICROSCOPIC  I-STAT TROPONIN, ED    EKG EKG Interpretation  Date/Time:  Thursday August 10 2018 19:33:48 EDT Ventricular Rate:  71 PR Interval:    QRS Duration: 88 QT Interval:  395 QTC Calculation: 430 R Axis:   74 Text Interpretation:  Sinus rhythm Short PR interval Probable left atrial enlargement No significant change since last tracing Confirmed by Alvira Monday ([id number]) on 08/10/2018 11:13:03 PM   Radiology No results found.  Procedures Procedures (including critical care time)  Medications Ordered in ED Medications  sodium chloride 0.9 % bolus 1,000 mL (0 mLs Intravenous Stopped 08/10/18 2300)     Initial Impression / Assessment and Plan / ED Course  I have reviewed the triage vital signs and the nursing notes.  Pertinent labs & imaging results that were available during my care of the patient were reviewed by me and considered in my medical decision making (see chart for details).        53 year old male with a history of bipolar, anxiety, hip  surgery in November, with report of bilateral leg weakness, severe deconditioning since surgery for which she had lived in a rehab facility until recently deciding to live on his own, presents with concern for episode of near syncope.  He has had 2 prior emergency department visits for similar concerns, with generalized weakness and near syncope.  Neurology also for near syncope includes arrythmia, anemia, electrolyte abnormality, dehydration, PE, ACS.  Denies headache, abdominal pain.  Other 10 showed no evidence of acute abnormality.  EKG without significant findings.  Troponin and d-dimer negative.  Urinalysis without signs of infection.  Reports poor p.o. intake and stress.  Given IV fluids in the emergency department.  Recommend follow-up with cardiology given episodes of near syncope  for possible outpatient Holter monitor placed an order for home health given recent rehab discharge with continued generalized weakness that would benefit for PT. Patient discharged in stable condition with understanding of reasons to return.   Final Clinical Impressions(s) / ED Diagnoses   Final diagnoses:  Near syncope    ED Discharge Orders         Ordered    Home Health     08/10/18 2254    Face-to-face encounter (required for Medicare/Medicaid patients)    Comments:  I Lynnea Ferrier certify that this patient is under my care and that I, or a nurse practitioner or physician's assistant working with me, had a face-to-face encounter that meets the physician face-to-face encounter requirements with this patient on 08/10/2018. The encounter with the patient was in whole, or in part for the following medical condition(s) which is the primary reason for home health care (List medical condition): has had chronic deconditioning, leg weakness since hip surgery, continuing to have mobility difficulty after recently deciding to leave rehab   08/10/18 2254           Alvira Monday, MD 08/11/18 0136

## 2018-08-10 NOTE — ED Triage Notes (Signed)
Pt was on the couch watching tv when he said he suddenly felt lightheaded, weak like he would pass out. Felt pressure in his chest, no pain. States he had same episode last week and says its the same thing. Feels he may still be dehydrated and that his anxiety is "acting up".

## 2018-08-11 ENCOUNTER — Emergency Department (HOSPITAL_COMMUNITY)
Admission: EM | Admit: 2018-08-11 | Discharge: 2018-08-11 | Disposition: A | Discharge status: Home / Self Care | Payer: Medicaid Other | Attending: Emergency Medicine | Admitting: Emergency Medicine

## 2018-08-11 ENCOUNTER — Encounter (HOSPITAL_COMMUNITY): Payer: Self-pay

## 2018-08-11 DIAGNOSIS — Z79899 Other long term (current) drug therapy: Secondary | ICD-10-CM | POA: Insufficient documentation

## 2018-08-11 DIAGNOSIS — R42 Dizziness and giddiness: Secondary | ICD-10-CM | POA: Diagnosis present

## 2018-08-11 DIAGNOSIS — F419 Anxiety disorder, unspecified: Secondary | ICD-10-CM | POA: Insufficient documentation

## 2018-08-11 LAB — I-STAT TROPONIN, ED: Troponin i, poc: 0 ng/mL (ref 0.00–0.08)

## 2018-08-11 MED ORDER — LORAZEPAM 1 MG PO TABS
1.0000 mg | ORAL_TABLET | Freq: Once | ORAL | Status: AC
  Administered 2018-08-11: 1 mg via ORAL
  Filled 2018-08-11: qty 1

## 2018-08-11 MED ORDER — LORAZEPAM 2 MG/ML IJ SOLN: 1.0000 mg | Freq: Once | INTRAMUSCULAR | Status: DC

## 2018-08-11 MED ORDER — LORAZEPAM 1 MG PO TABS: 1.0000 mg | ORAL_TABLET | Freq: Three times a day (TID) | ORAL | 0 refills | Status: DC | PRN

## 2018-08-11 NOTE — Discharge Instructions (Addendum)
Please establish care with a primary care physician for follow-up.  There are no signs of any life-threatening illness present today.  You need to follow-up with a primary care doctor for your symptoms.   Steps to find a Primary Care Provider (PCP):  Call [phone] or [phone] to access "[facility] Find a Doctor Service."  2.  You may also go on the [facility] website at [email or web address]  3.  [facility] also frequently accepts new patients.  [facility]  [address] [place] [address] [phone]  4.  There are also multiple [facility], [facility] and [facility] practices throughout the [place] that are frequently accepting new patients. You may find a clinic that is close to your home and contact them.  [facility] [email or web address] [phone]  [facility] [email or web address]  [email or web address] [phone]  [facility] [email or web address] [phone]  5.  Local Health Departments also can provide primary care services.  [facility]  [address] [place] [address] [phone]  [facility] Department [address] [place] [address] [phone]  [facility] Department [address] [place] [address]  [place] [address] [phone]

## 2018-08-11 NOTE — ED Triage Notes (Signed)
Pt arrived via [facility], pt states he feels like he is going to pass out and has chest tightness but denies any chest pain. Reports feeling weak. [facility] from [facility] minutes ago. Negative for orthostatics.

## 2018-08-11 NOTE — ED Notes (Signed)
Pt requesting to be admitted until he feels better, this nurse explained that we do not have a reason to admit him that he has been discharged.

## 2018-08-11 NOTE — ED Notes (Signed)
Patient checked back in due to not having taxi voucher.  Patient given taxi voucher and went home.

## 2018-08-11 NOTE — ED Notes (Signed)
[employer] taxi service called per pt request.

## 2018-08-11 NOTE — ED Provider Notes (Signed)
TIME SEEN: 2:32 AM  CHIEF COMPLAINT: Dizziness, chest tightness  HPI: Patient is a 53 year old male with history of bipolar disorder and anxiety who presents to the emergency department with lightheadedness feeling like he is going to pass out and chest tightness.  Has been seen in the emergency department 3 times for the same in the past month and was just discharged from the [facility] emergency department prior to arrival in the emergency department here at [facility].  States he went home and started getting very anxious and had chest tightness and felt lightheaded.  It appears he has had an extensive work-up in the ED with multiple negative troponins and today had normal hemoglobin, electrolytes, urine with no sign of infection or dehydration, negative troponin and a normal d-dimer.  States symptoms have been worsening since recent hip replacement.  He was in a rehab facility but is now at home.  States he is very anxious about this.  No history of PE or DVT.  No calf swelling or tenderness.  No history of hypertension, diabetes, hyperlipidemia or tobacco use.  No shortness of breath, nausea, vomiting, diaphoresis.  ROS: See HPI Constitutional: no fever  Eyes: no drainage  ENT: no runny nose   Cardiovascular:   chest pain  Resp: no SOB  GI: no vomiting GU: no dysuria Integumentary: no rash  Allergy: no hives  Musculoskeletal: no leg swelling  Neurological: no slurred speech ROS otherwise negative  PAST MEDICAL HISTORY/PAST SURGICAL HISTORY:  Past Medical History:  Diagnosis Date  . Anxiety   . Bipolar 2 disorder (HCC)   . Depression   . GERD (gastroesophageal reflux disease)   . Hyperhydrosis disorder     MEDICATIONS:  Prior to Admission medications   Medication Sig Start Date End Date Taking? Authorizing Provider  cholecalciferol (VITAMIN D3) 25 MCG (1000 UT) tablet Take 1,000 Units by mouth every morning.    [provider]  dicyclomine (BENTYL) 10 MG capsule Take  10 mg by mouth every morning.    [provider]  FLUoxetine (PROZAC) 40 MG capsule Take 40 mg by mouth every morning.    [provider]  gabapentin (NEURONTIN) 400 MG capsule Take 400 mg by mouth 2 (two) times daily. Morning and evening    [provider]  OLANZapine (ZYPREXA) 5 MG tablet Take 5 mg by mouth at bedtime.    [provider]  ranitidine (ZANTAC) 150 MG capsule Take 150 mg by mouth 2 (two) times daily. Morning and evening    [provider]  tamsulosin (FLOMAX) 0.4 MG CAPS capsule Take 0.4 mg by mouth every morning.    [provider]    ALLERGIES:  Allergies  Allergen Reactions  . Bee Venom Swelling    Swelling at site of sting  . Erythromycin Diarrhea    SOCIAL HISTORY:  Social History   Tobacco Use  . Smoking status: Never Smoker  . Smokeless tobacco: Never Used  Substance Use Topics  . Alcohol use: No    FAMILY HISTORY: No family history on file.  EXAM: BP (!) 155/85 (BP Location: Left Arm)   Pulse 74   Temp 98 F (36.7 C) (Oral)   Resp 16   Ht 5\' 3"  (1.6 m)   Wt 62.6 kg   SpO2 98%   BMI 24.45 kg/m  CONSTITUTIONAL: Alert and oriented and responds appropriately to questions.  Appears very anxious. HEAD: Normocephalic EYES: Conjunctivae clear, pupils appear equal, EOMI ENT: normal nose; moist mucous  membranes NECK: Supple, no meningismus, no nuchal rigidity, no LAD  CARD: RRR; S1 and S2 appreciated; no murmurs, no clicks, no rubs, no gallops RESP: Normal chest excursion without splinting or tachypnea; breath sounds clear and equal bilaterally; no wheezes, no rhonchi, no rales, no hypoxia or respiratory distress, speaking full sentences ABD/GI: Normal bowel sounds; non-distended; soft, non-tender, no rebound, no guarding, no peritoneal signs, no hepatosplenomegaly BACK:  The back appears normal and is non-tender to palpation, there is no CVA tenderness EXT: Normal ROM in all joints; non-tender to  palpation; no edema; normal capillary refill; no cyanosis, no calf tenderness or swelling    SKIN: Normal color for age and race; warm; no rash NEURO: Moves all extremities equally PSYCH: Appears very anxious.  MEDICAL DECISION MAKING: Patient here with chest tightness and lightheadedness.  I suspect that this is actually likely secondary to his anxiety and he agrees.  He is very well-appearing today and has had multiple negative work-ups.  EKG here shows no arrhythmia, ischemic abnormality or interval changes.  Will repeat second troponin but have discussed with patient if this is negative, I recommend close follow-up with his primary care provider.  I do not feel other labs, urine need to be obtained today given they were just done less than 6 hours ago and were normal.  ED PROGRESS: Patient's heart score is 1.  Doubt ACS, PE, dissection.  Doubt arrhythmia.  No events on cardiac monitoring.  Troponin negative.  Will discharge with prescription of Ativan and have recommended close outpatient follow-up.   At this time, I do not feel there is any life-threatening condition present. I have reviewed and discussed all results (EKG, imaging, lab, urine as appropriate) and exam findings with patient/family. I have reviewed nursing notes and appropriate previous records.  I feel the patient is safe to be discharged home without further emergent workup and can continue workup as an outpatient as needed. Discussed usual and customary return precautions. Patient/family verbalize understanding and are comfortable with this plan.  Outpatient follow-up has been provided as needed. All questions have been answered.      EKG Interpretation  Date/Time:  Friday August 11 2018 02:29:17 EDT Ventricular Rate:  73 PR Interval:    QRS Duration: 88 QT Interval:  404 QTC Calculation: 446 R Axis:   81 Text Interpretation:  Sinus rhythm Borderline short PR interval Probable left atrial enlargement No significant change  since last tracing Confirmed by Maricel Swartzendruber, Baxter Hire [phone]) on 08/11/2018 2:31:29 AM         Jamiere Gulas, Layla Maw, DO 08/11/18 6415

## 2018-09-16 ENCOUNTER — Encounter (HOSPITAL_COMMUNITY): Payer: Self-pay | Admitting: Emergency Medicine

## 2018-09-16 ENCOUNTER — Emergency Department (HOSPITAL_COMMUNITY): Payer: Medicaid Other

## 2018-09-16 ENCOUNTER — Other Ambulatory Visit: Payer: Self-pay

## 2018-09-16 ENCOUNTER — Inpatient Hospital Stay (HOSPITAL_COMMUNITY)
Admission: EM | Admit: 2018-09-16 | Discharge: 2018-09-20 | DRG: 281 | Disposition: A | Discharge status: Home / Self Care | Payer: Medicaid Other | Attending: Internal Medicine | Admitting: Internal Medicine

## 2018-09-16 DIAGNOSIS — Z91030 Bee allergy status: Secondary | ICD-10-CM

## 2018-09-16 DIAGNOSIS — R52 Pain, unspecified: Secondary | ICD-10-CM

## 2018-09-16 DIAGNOSIS — R0602 Shortness of breath: Secondary | ICD-10-CM | POA: Diagnosis present

## 2018-09-16 DIAGNOSIS — Z881 Allergy status to other antibiotic agents status: Secondary | ICD-10-CM

## 2018-09-16 DIAGNOSIS — F3181 Bipolar II disorder: Secondary | ICD-10-CM | POA: Diagnosis present

## 2018-09-16 DIAGNOSIS — I214 Non-ST elevation (NSTEMI) myocardial infarction: Secondary | ICD-10-CM

## 2018-09-16 DIAGNOSIS — E860 Dehydration: Secondary | ICD-10-CM | POA: Diagnosis present

## 2018-09-16 DIAGNOSIS — R9439 Abnormal result of other cardiovascular function study: Secondary | ICD-10-CM

## 2018-09-16 DIAGNOSIS — M25551 Pain in right hip: Secondary | ICD-10-CM | POA: Diagnosis present

## 2018-09-16 DIAGNOSIS — N179 Acute kidney failure, unspecified: Secondary | ICD-10-CM | POA: Diagnosis present

## 2018-09-16 DIAGNOSIS — I252 Old myocardial infarction: Secondary | ICD-10-CM

## 2018-09-16 DIAGNOSIS — F411 Generalized anxiety disorder: Secondary | ICD-10-CM | POA: Diagnosis present

## 2018-09-16 DIAGNOSIS — R45851 Suicidal ideations: Secondary | ICD-10-CM | POA: Diagnosis present

## 2018-09-16 DIAGNOSIS — Z79899 Other long term (current) drug therapy: Secondary | ICD-10-CM

## 2018-09-16 DIAGNOSIS — R001 Bradycardia, unspecified: Secondary | ICD-10-CM | POA: Diagnosis not present

## 2018-09-16 DIAGNOSIS — I959 Hypotension, unspecified: Secondary | ICD-10-CM | POA: Diagnosis not present

## 2018-09-16 DIAGNOSIS — Z96641 Presence of right artificial hip joint: Secondary | ICD-10-CM | POA: Diagnosis present

## 2018-09-16 DIAGNOSIS — K219 Gastro-esophageal reflux disease without esophagitis: Secondary | ICD-10-CM | POA: Diagnosis present

## 2018-09-16 IMAGING — CR DG HIP (WITH OR WITHOUT PELVIS) 2-3V RIGHT
3 series · 3 of 3 positions shown · non-contrast
Comparison: [DATE]

CLINICAL DATA: Hip pain

EXAM:
DG HIP (WITH OR WITHOUT PELVIS) 2-3V RIGHT

[pelvis ap]
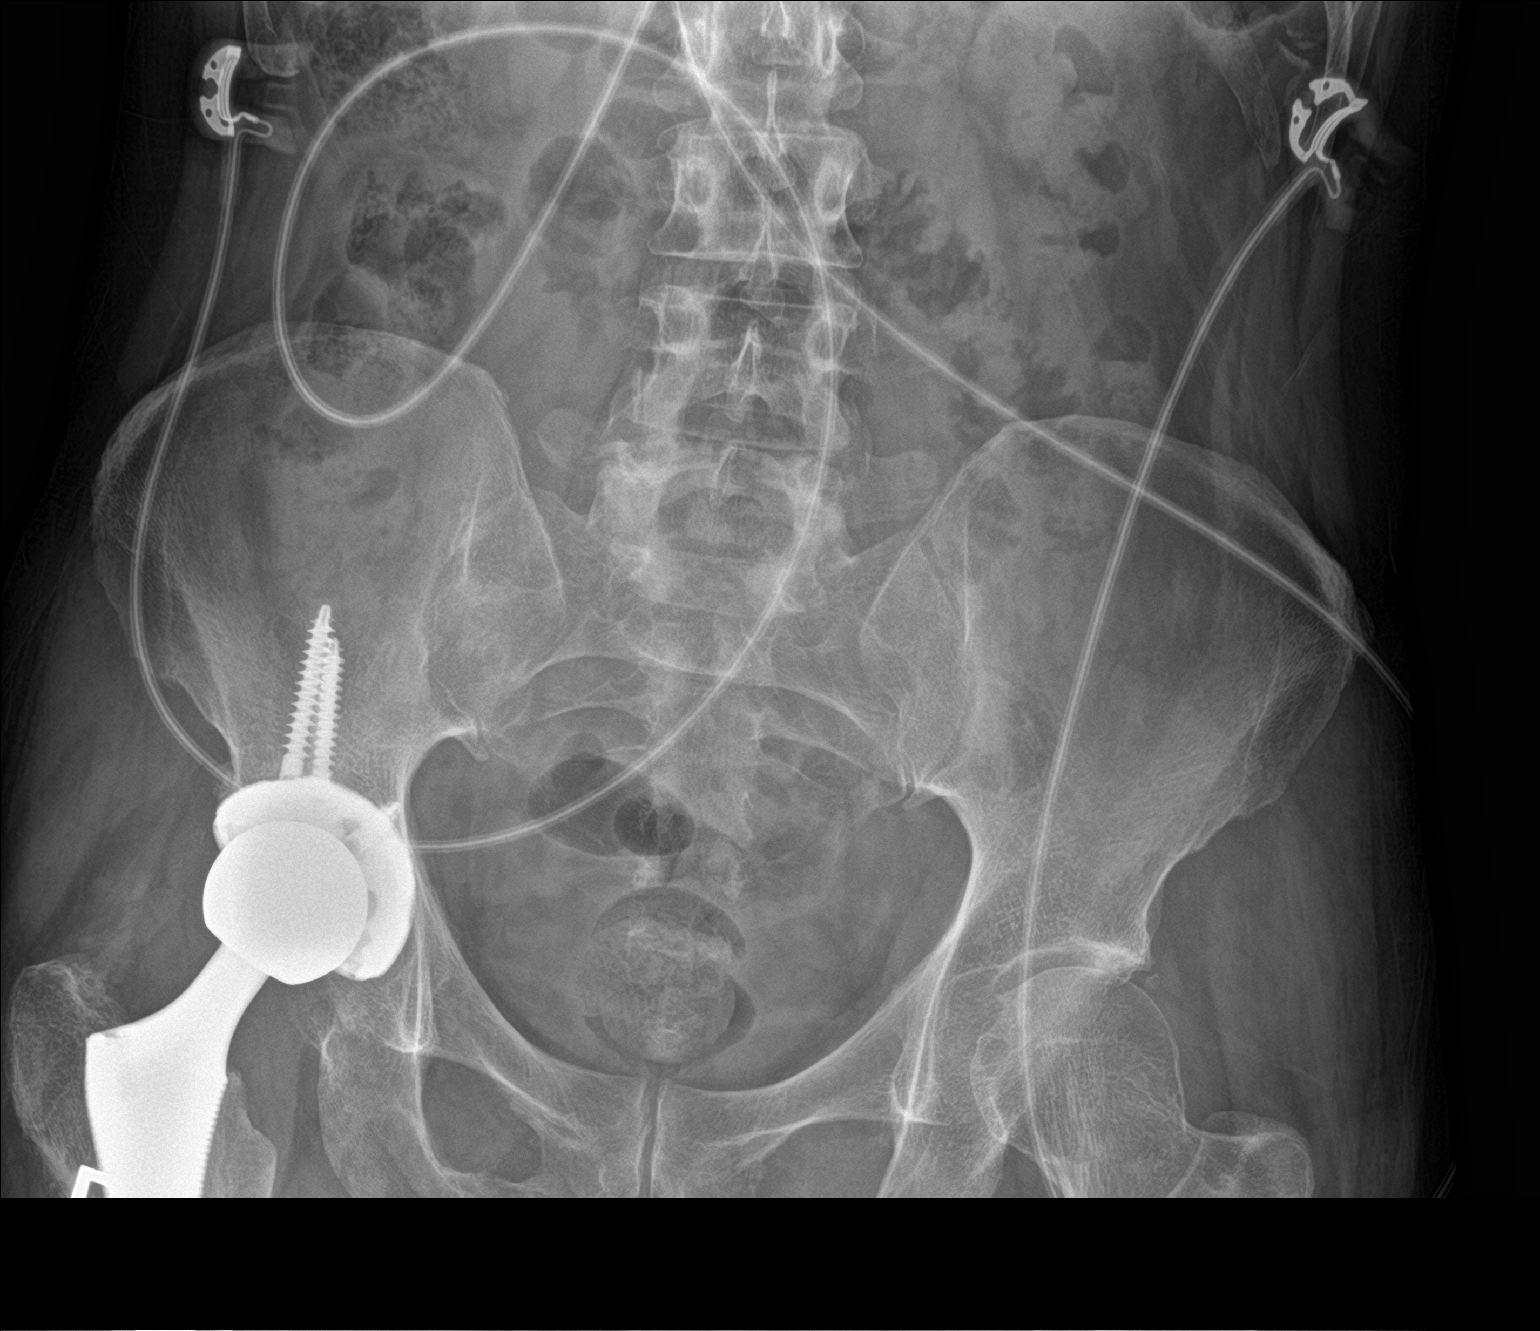

[hip lat]
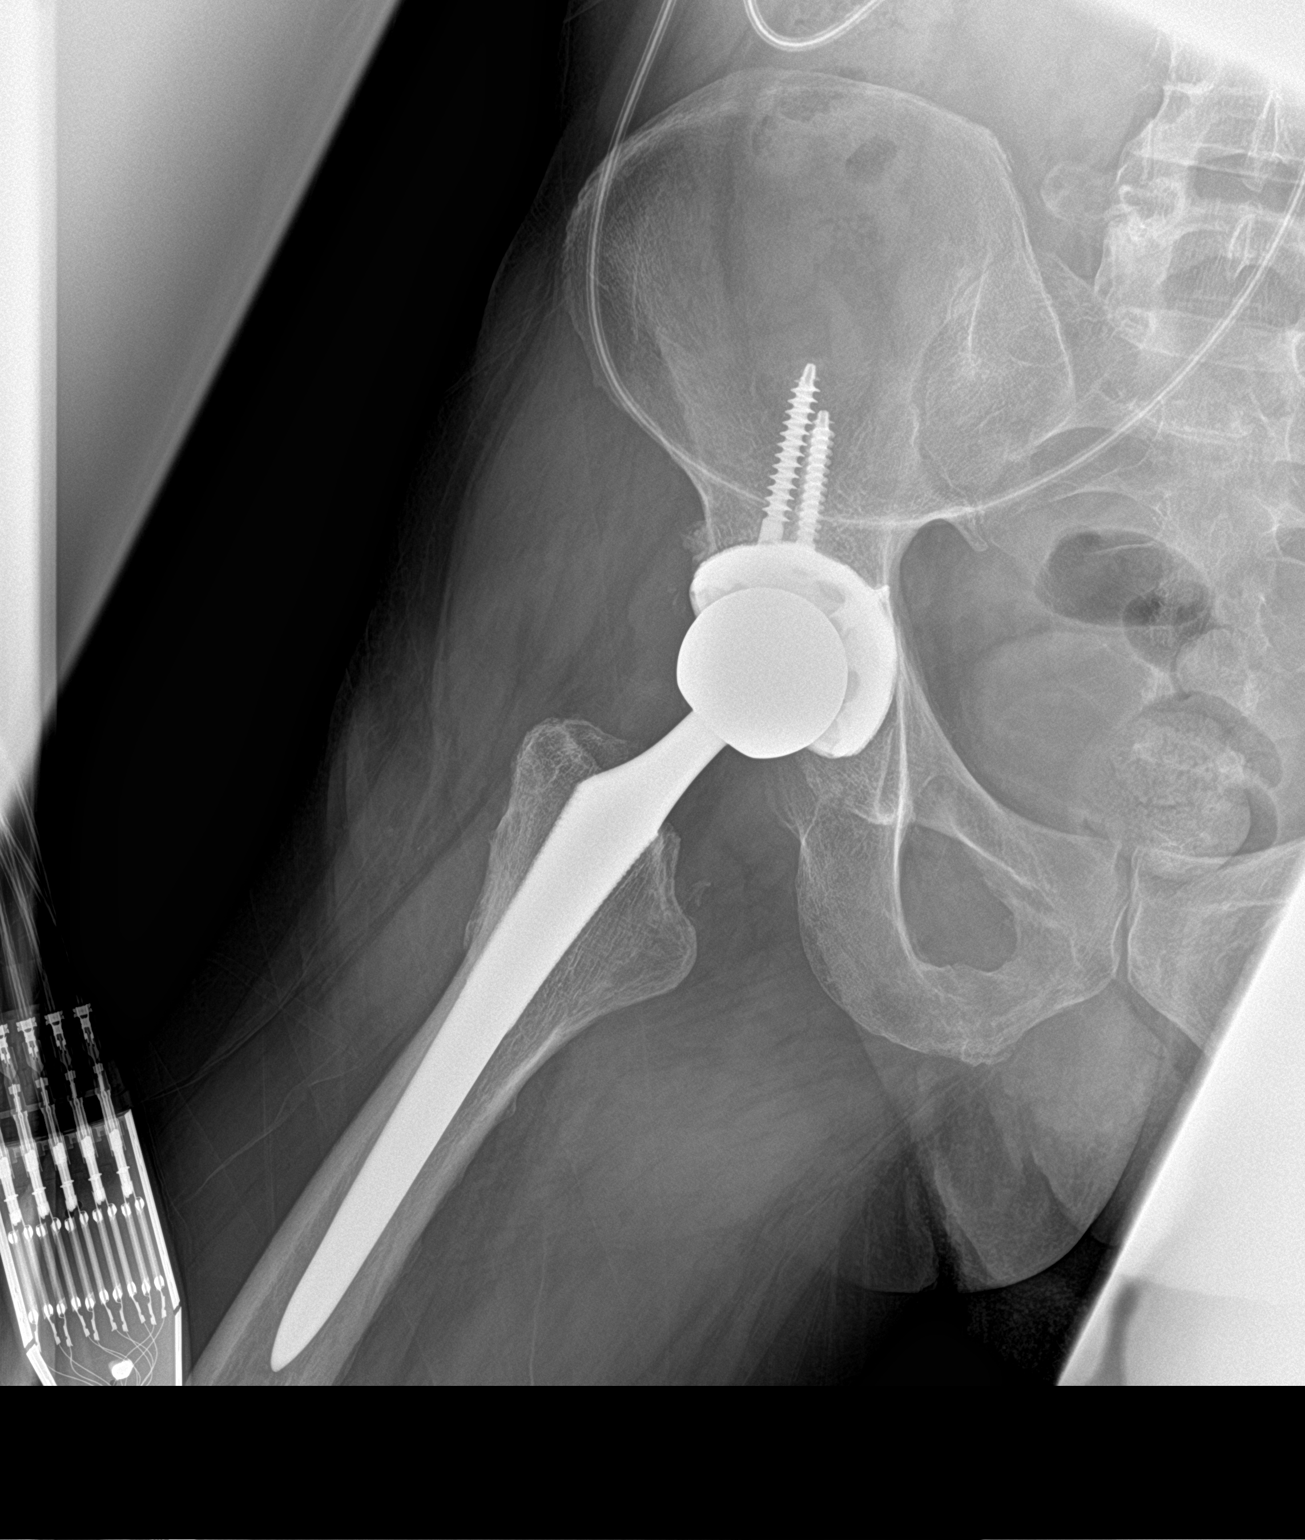

[hip ap]
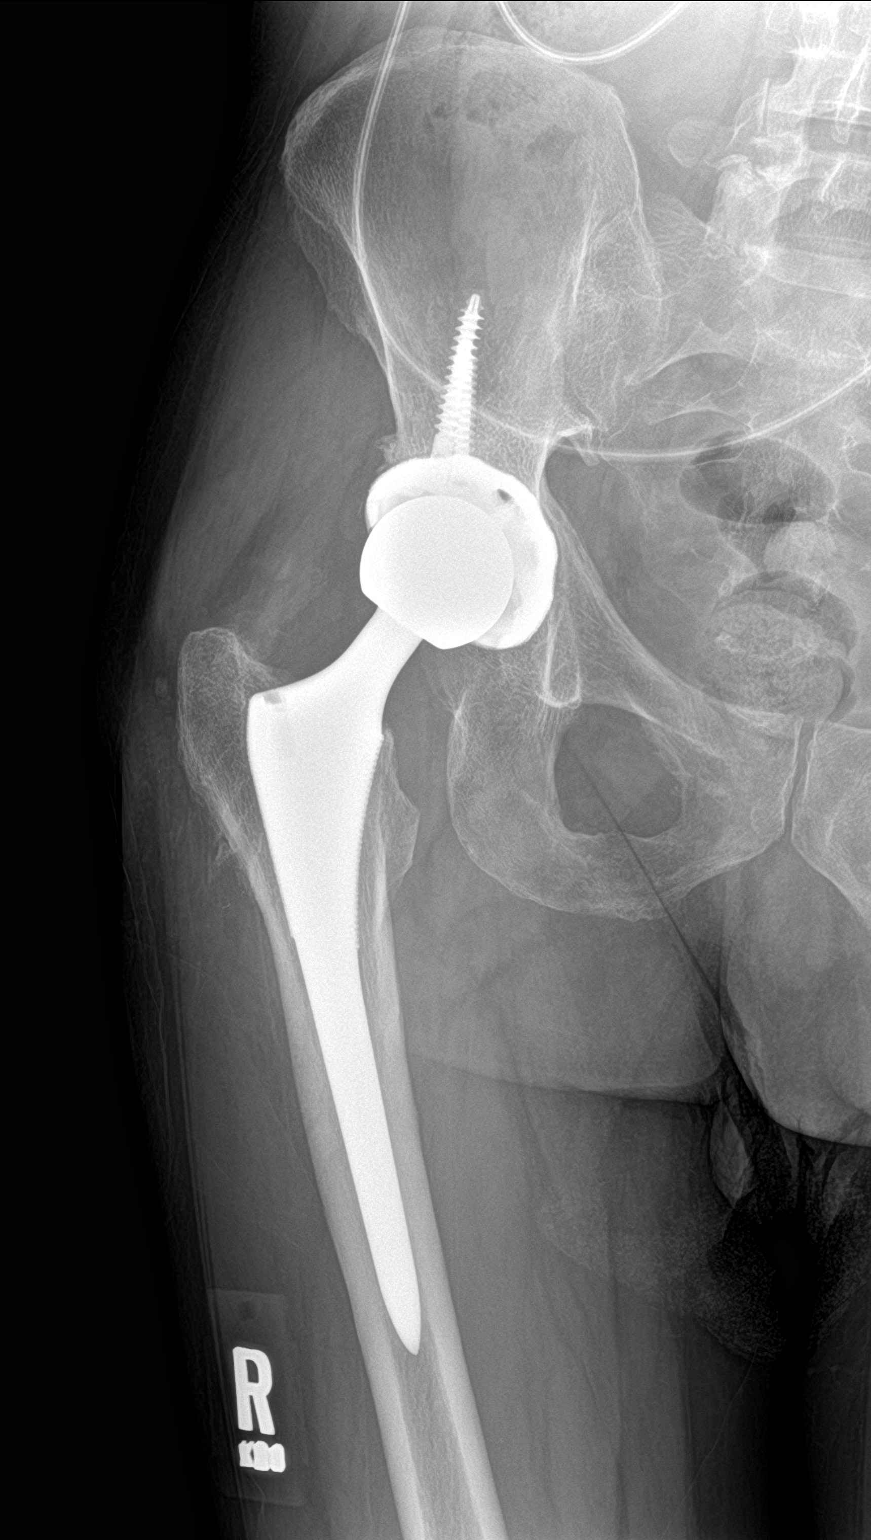

[3 of 3 positions shown; findings below may reference images not displayed]

FINDINGS: SI joints are non widened. Pubic symphysis and rami are intact.
Status post right hip replacement with normal alignment. No fracture
seen
IMPRESSION: Status post right hip replacement without acute osseous abnormality

## 2018-09-16 IMAGING — CR CHEST - 2 VIEW
2 series · 2 of 2 positions shown · non-contrast
Comparison: [DATE]

CLINICAL DATA: Shortness of breath

EXAM:
CHEST - 2 VIEW

[chest pa]
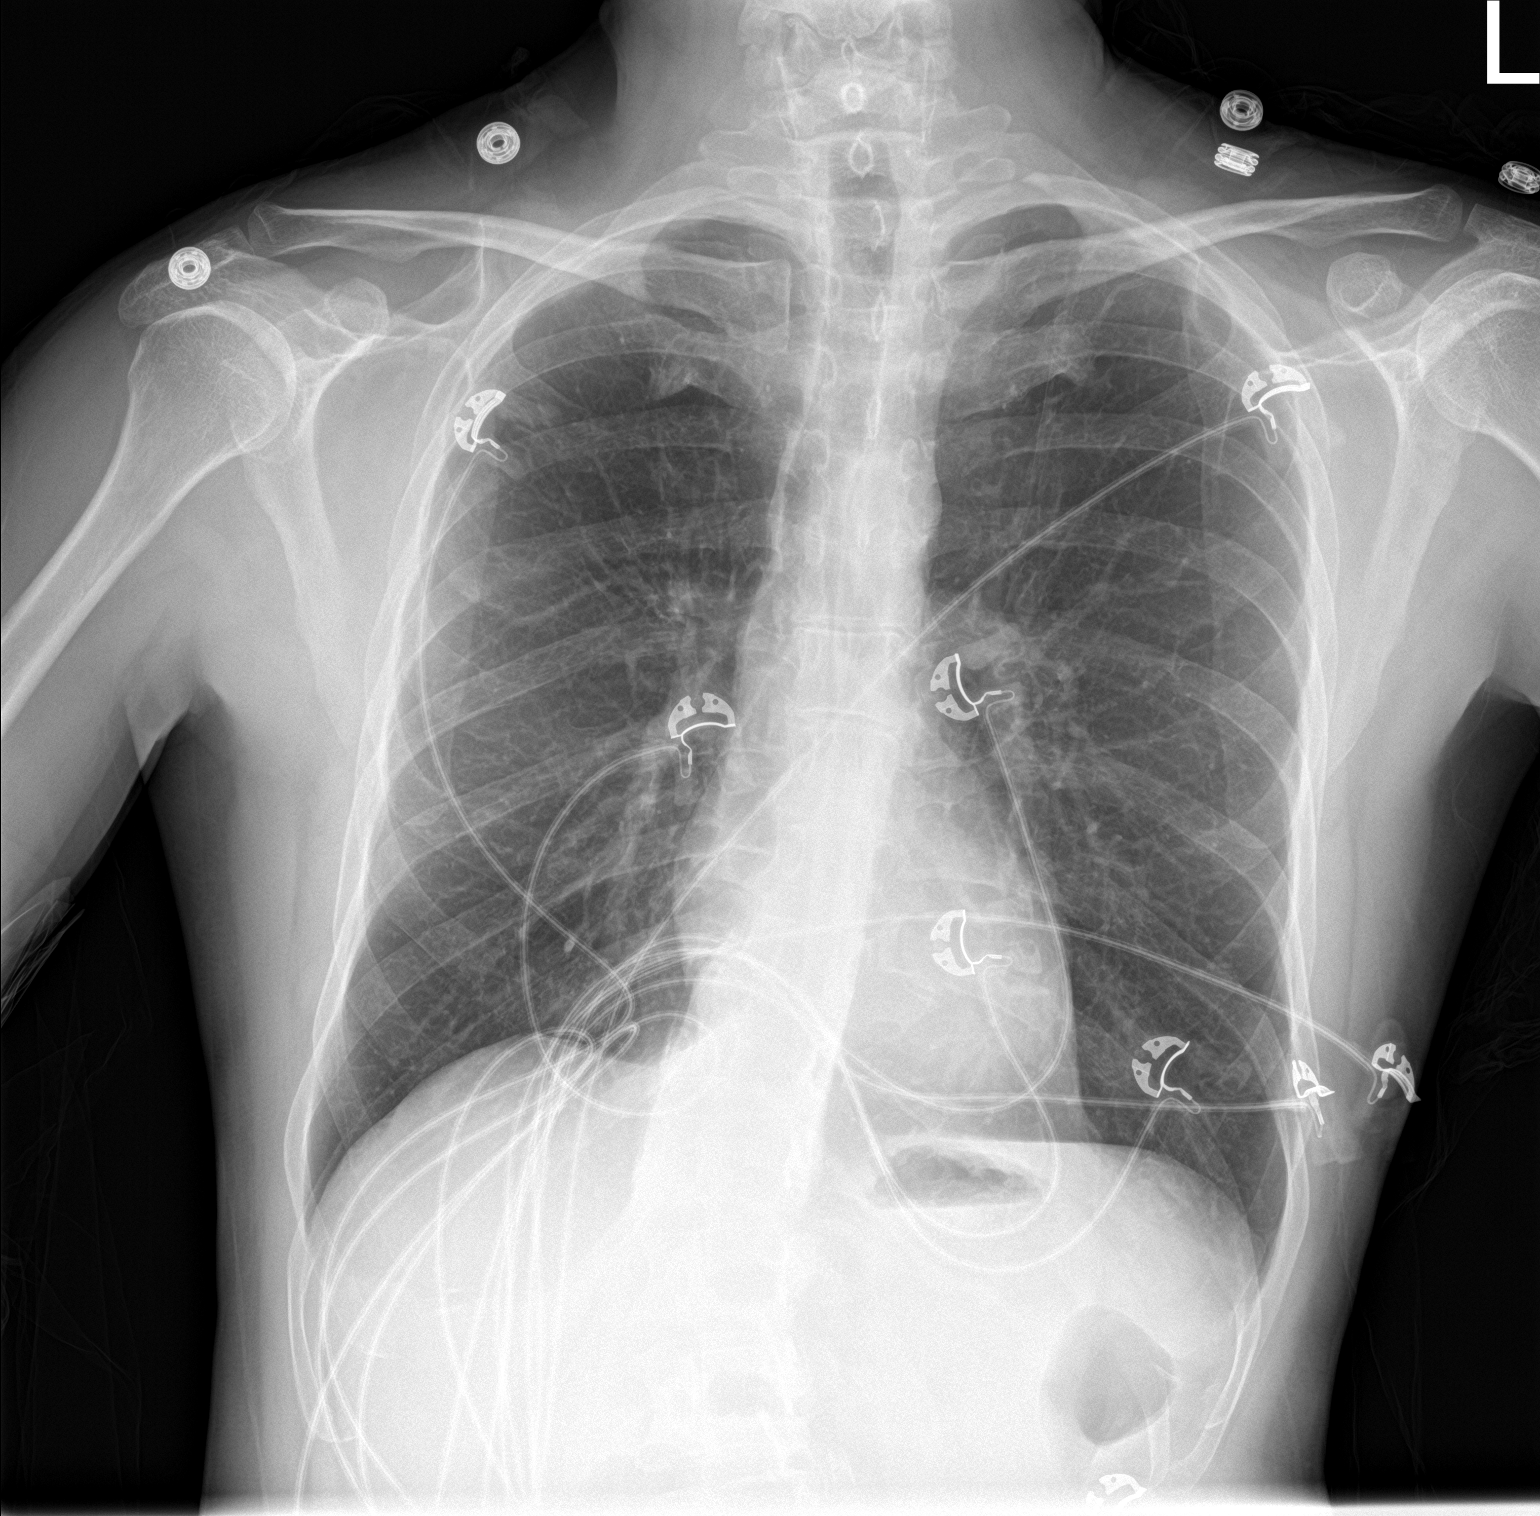

[chest lat]
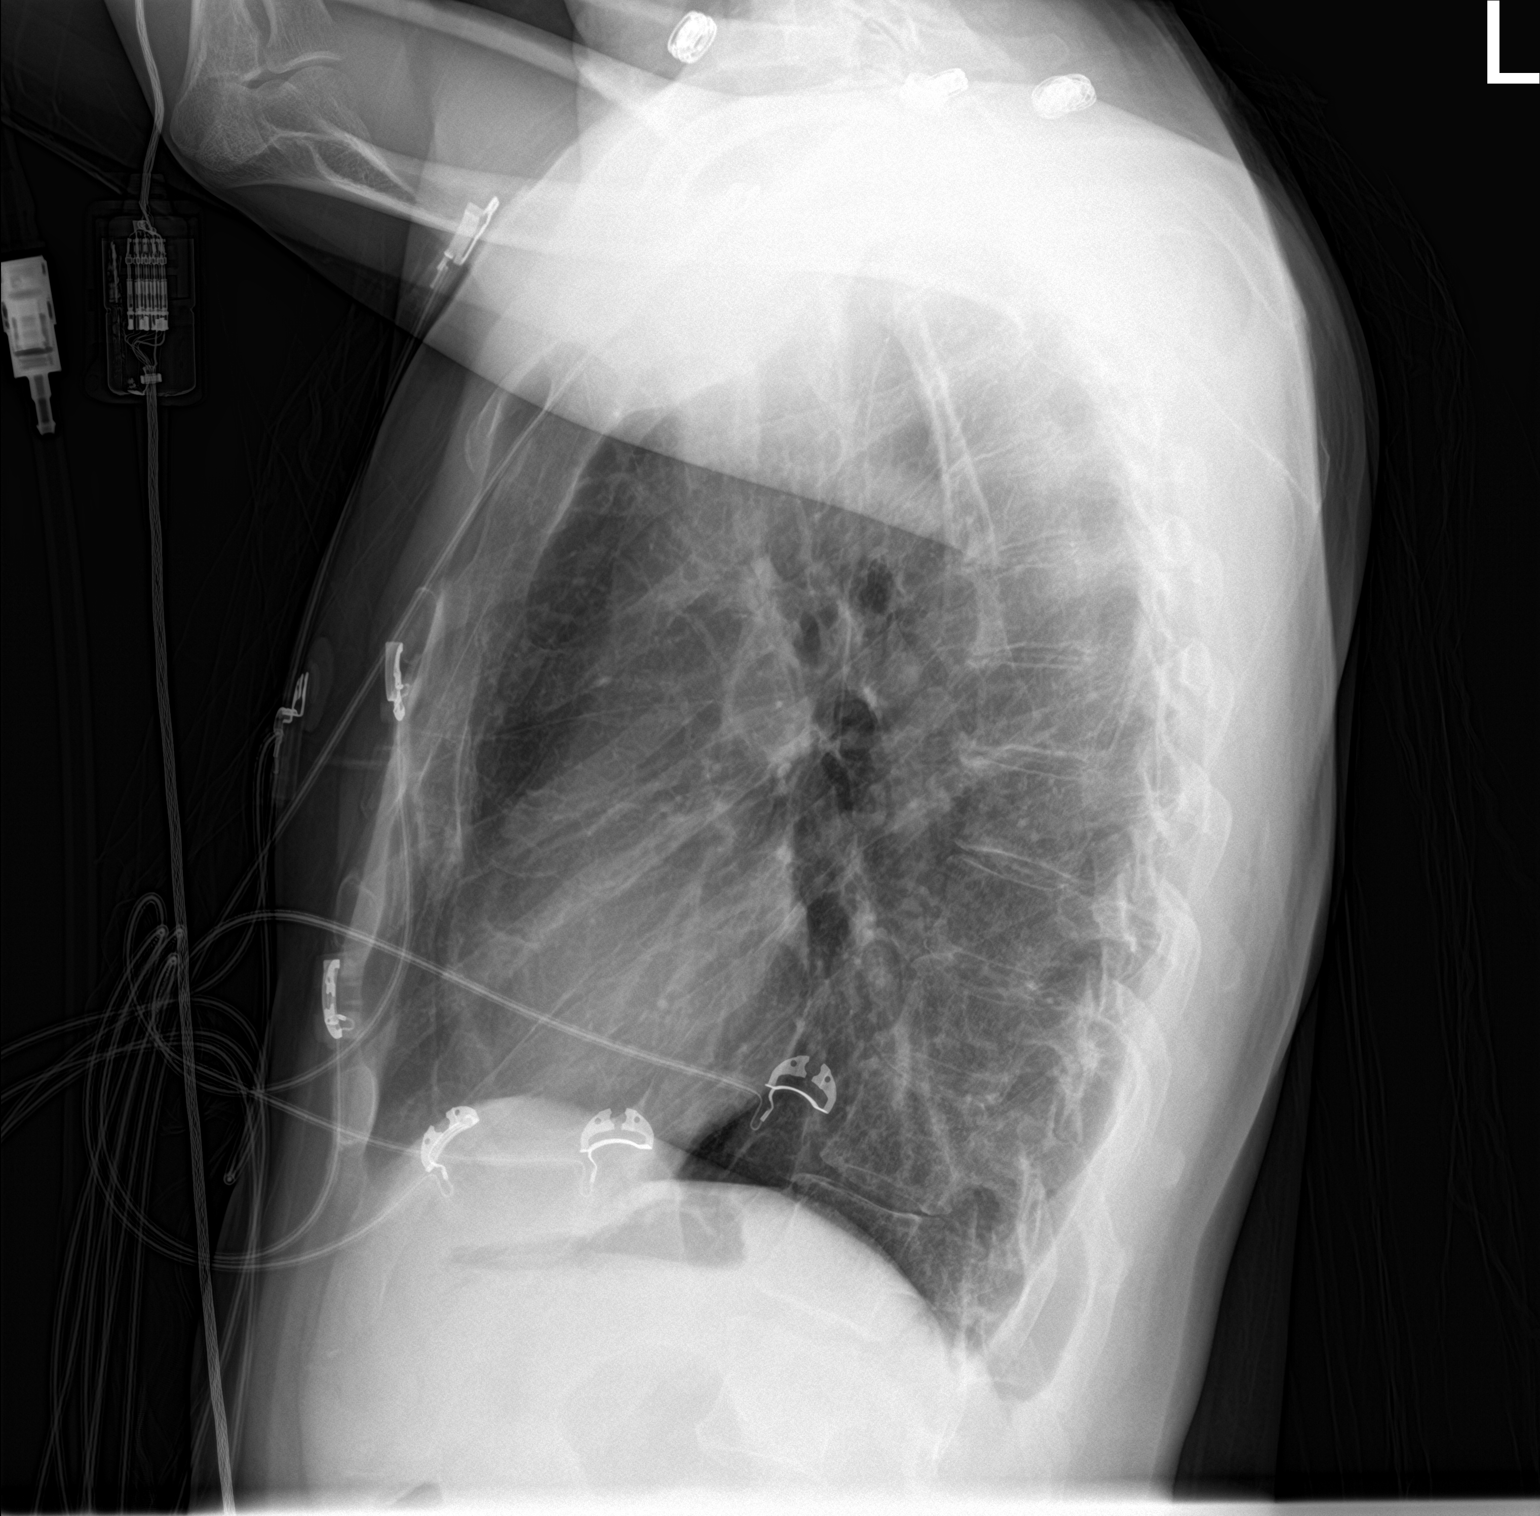

[2 of 2 positions shown; findings below may reference images not displayed]

FINDINGS: The heart size and mediastinal contours are within normal limits.
Both lungs are clear. The visualized skeletal structures are
unremarkable.
IMPRESSION: No active cardiopulmonary disease.

## 2018-09-16 NOTE — ED Notes (Signed)
Delay in lab draw,   Pt enroute to xray. 

## 2018-09-16 NOTE — ED Triage Notes (Signed)
Pt from home w/ a c/o SI. Not HI. Presented as anxious w/ EMS. Pt has been feeling depressed and has had little energy recently. Has not had anything to eat today bc of lack of energy. He lives alone and is complaint w/ meds.   124/68 HR 90 RR 18

## 2018-09-16 NOTE — ED Provider Notes (Signed)
[facility] EMERGENCY DEPARTMENT Provider Note   CSN: [id number] Arrival date & time: 09/16/18  2227    History   Chief Complaint Chief Complaint  Patient presents with  . Suicidal    HPI Gary [name] is a 53 y.o. male.     Patient presents to the emergency department with a chief complaint of suicidal thoughts, right hip pain, and shortness of breath.  He states that because of his persistent right hip pain he has had significant depression with suicidal thoughts.  He does not have a clear plan of how he would harm himself.  Reports having had multiple hip surgeries at [facility] hospital in late last year.  Additionally, he reports having some shortness of breath, but denies any fever, chills, cough.  He reports that EMS told him he did have a fever to 102, but is noted to be afebrile in triage.  He states that his hip pain he seemed to cause able to walk very well.  He has not followed up.  The history is provided by the patient. No language interpreter was used.   Gary [name] was evaluated in Emergency Department on 09/17/2018 for the symptoms described in the history of present illness. He was evaluated in the context of the global COVID-19 pandemic, which necessitated consideration that the patient might be at risk for infection with the SARS-CoV-2 virus that causes COVID-19. Institutional protocols and algorithms that pertain to the evaluation of patients at risk for COVID-19 are in a state of rapid change based on information released by regulatory bodies including the CDC and federal and state organizations. These policies and algorithms were followed during the patient's care in the ED.   Past Medical History:  Diagnosis Date  . Anxiety   . Bipolar 2 disorder (HCC)   . Depression   . GERD (gastroesophageal reflux disease)   . Hyperhydrosis disorder     Patient Active Problem List   Diagnosis Date Noted  . Severe recurrent major depression  without psychotic features (HCC) 11/06/2015  . GAD (generalized anxiety disorder) 11/06/2015    Past Surgical History:  Procedure Laterality Date  . HERNIA REPAIR    . HIP SURGERY Right         Home Medications    Prior to Admission medications   Medication Sig Start Date End Date Taking? Authorizing Provider  cholecalciferol (VITAMIN D3) 25 MCG (1000 UT) tablet Take 1,000 Units by mouth every morning.    [provider]  dicyclomine (BENTYL) 10 MG capsule Take 10 mg by mouth every morning.    [provider]  FLUoxetine (PROZAC) 40 MG capsule Take 40 mg by mouth every morning.    [provider]  gabapentin (NEURONTIN) 400 MG capsule Take 400 mg by mouth 2 (two) times daily. Morning and evening    [provider]  LORazepam (ATIVAN) 1 MG tablet Take 1 tablet (1 mg total) by mouth 3 (three) times daily as needed for anxiety. 08/11/18   Ward, Baxter Hire N, DO  OLANZapine (ZYPREXA) 5 MG tablet Take 5 mg by mouth at bedtime.    [provider]  ranitidine (ZANTAC) 150 MG capsule Take 150 mg by mouth 2 (two) times daily. Morning and evening    [provider]  tamsulosin (FLOMAX) 0.4 MG CAPS capsule Take 0.4 mg by mouth every morning.    [provider]    Family History No family history on file.  Social History Social  History   Tobacco Use  . Smoking status: Never Smoker  . Smokeless tobacco: Never Used  Substance Use Topics  . Alcohol use: No  . Drug use: No     Allergies   Bee venom and Erythromycin   Review of Systems Review of Systems  All other systems reviewed and are negative.    Physical Exam Updated Vital Signs BP (!) 132/92 (BP Location: Right Arm)   Pulse 88   Temp 98.4 F (36.9 C)   Resp 16   Ht 5\' 3"  (1.6 m)   Wt 83.5 kg   SpO2 100%   BMI 32.59 kg/m   Physical Exam Vitals signs and nursing note reviewed.  Constitutional:      Appearance: He is well-developed.  HENT:     Head:  Normocephalic and atraumatic.  Eyes:     Conjunctiva/sclera: Conjunctivae normal.  Neck:     Musculoskeletal: Neck supple.  Cardiovascular:     Rate and Rhythm: Normal rate and regular rhythm.     Heart sounds: No murmur.  Pulmonary:     Effort: Pulmonary effort is normal. No respiratory distress.     Breath sounds: Normal breath sounds.     Comments: CTAB Abdominal:     Palpations: Abdomen is soft.     Tenderness: There is no abdominal tenderness.  Musculoskeletal:     Comments: Range of motion and strength 5/5, no bony abnormality or deformity, well-healing surgical incision, no evidence of infection  Skin:    General: Skin is warm and dry.  Neurological:     Mental Status: He is alert and oriented to person, place, and time.  Psychiatric:     Comments: withdrawn      ED Treatments / Results  Labs (all labs ordered are listed, but only abnormal results are displayed) Labs Reviewed  COMPREHENSIVE METABOLIC PANEL - Abnormal; Notable for the following components:      Result Value   Glucose, Bld 105 (*)    All other components within normal limits  ACETAMINOPHEN LEVEL - Abnormal; Notable for the following components:   Acetaminophen (Tylenol), Serum <10 (*)    All other components within normal limits  TROPONIN I - Abnormal; Notable for the following components:   Troponin I 0.04 (*)    All other components within normal limits  CBC WITH DIFFERENTIAL/PLATELET  SALICYLATE LEVEL  ETHANOL  D-DIMER, QUANTITATIVE (NOT AT [facility])  RAPID URINE DRUG SCREEN, HOSP PERFORMED  BRAIN NATRIURETIC PEPTIDE    EKG EKG Interpretation  Date/Time:  Saturday September 16 2018 23:17:07 EDT Ventricular Rate:  74 PR Interval:    QRS Duration: 89 QT Interval:  379 QTC Calculation: 421 R Axis:   81 Text Interpretation:  Sinus rhythm Short PR interval No significant change since last tracing Confirmed by Marily Memos [phone]) on 09/17/2018 12:07:13 AM   Radiology Dg Chest 2 View  Result  Date: 09/16/2018 CLINICAL DATA:  Shortness of breath EXAM: CHEST - 2 VIEW COMPARISON:  08/03/2018 FINDINGS: The heart size and mediastinal contours are within normal limits. Both lungs are clear. The visualized skeletal structures are unremarkable. IMPRESSION: No active cardiopulmonary disease. Electronically Signed   By: Jasmine Pang M.D.   On: 09/16/2018 23:41   Dg Hip Unilat With Pelvis 2-3 Views Right  Result Date: 09/16/2018 CLINICAL DATA:  Hip pain EXAM: DG HIP (WITH OR WITHOUT PELVIS) 2-3V RIGHT COMPARISON:  04/18/2018 FINDINGS: SI joints are non widened. Pubic symphysis and rami are intact. Status post right hip  replacement with normal alignment. No fracture seen IMPRESSION: Status post right hip replacement without acute osseous abnormality Electronically Signed   By: Jasmine PangKim  Fujinaga M.D.   On: 09/16/2018 23:42    Procedures Procedures (including critical care time) CRITICAL CARE Performed by: Roxy Horsemanobert Anylah Scheib  Positive troponin, heparin infusion Total critical care time: 37 minutes  Critical care time was exclusive of separately billable procedures and treating other patients.  Critical care was necessary to treat or prevent imminent or life-threatening deterioration.  Critical care was time spent personally by me on the following activities: development of treatment plan with patient and/or surrogate as well as nursing, discussions with consultants, evaluation of patient's response to treatment, examination of patient, obtaining history from patient or surrogate, ordering and performing treatments and interventions, ordering and review of laboratory studies, ordering and review of radiographic studies, pulse oximetry and re-evaluation of patient's condition.  Medications Ordered in ED Medications - No data to display   Initial Impression / Assessment and Plan / ED Course  I have reviewed the triage vital signs and the nursing notes.  Pertinent labs & imaging results that were  available during my care of the patient were reviewed by me and considered in my medical decision making (see chart for details).        Patient with shortness of breath times several days.  Reports feeling fatigued.  Also reports feeling depressed.  Has had some suicidal thoughts.  Cleared by TTS for outpatient follow-up regarding his depression and suicidal thoughts.  Regarding his shortness of breath, will check labs and x-ray.  Chest x-ray is clear.  He is afebrile.  I doubt COVID-19.  D-dimer is negative, doubt pulmonary embolus.  He does not have any lower extremity swelling, lungs are clear, no evidence of fluid overload, doubt CHF.  Troponin is 0.04.  Consulted with cardiology, Dr. Clarnce FlockFudim, who recommends trending troponins and starting heparin for end STEMI.  Anticipates left heart catheterization on Monday morning.  Patient Dr. Onalee Huaavid for admitting the patient.  Final Clinical Impressions(s) / ED Diagnoses   Final diagnoses:  NSTEMI (non-ST elevated myocardial infarction) (HCC)  SOB (shortness of breath)    ED Discharge Orders    None       Roxy HorsemanBrowning, Aaronmichael Brumbaugh, PA-C 09/17/18 0210    Mesner, Barbara CowerJason, MD 09/17/18 [phone]

## 2018-09-17 ENCOUNTER — Encounter (HOSPITAL_COMMUNITY): Payer: Self-pay | Admitting: Family Medicine

## 2018-09-17 DIAGNOSIS — R0602 Shortness of breath: Secondary | ICD-10-CM | POA: Diagnosis not present

## 2018-09-17 DIAGNOSIS — K219 Gastro-esophageal reflux disease without esophagitis: Secondary | ICD-10-CM | POA: Diagnosis present

## 2018-09-17 DIAGNOSIS — I214 Non-ST elevation (NSTEMI) myocardial infarction: Secondary | ICD-10-CM | POA: Diagnosis not present

## 2018-09-17 LAB — COMPREHENSIVE METABOLIC PANEL
ALT: 16 U/L (ref 0–44)
AST: 23 U/L (ref 15–41)
Albumin: 3.8 g/dL (ref 3.5–5.0)
Alkaline Phosphatase: 92 U/L (ref 38–126)
Anion gap: 11 (ref 5–15)
BUN: 13 mg/dL (ref 6–20)
CO2: 23 mmol/L (ref 22–32)
Calcium: 9.4 mg/dL (ref 8.9–10.3)
Chloride: 101 mmol/L (ref 98–111)
Creatinine, Ser: 1.15 mg/dL (ref 0.61–1.24)
GFR calc Af Amer: >60 mL/min (ref >60)
GFR calc non Af Amer: >60 mL/min (ref >60)
Glucose, Bld: 105 mg/dL — ABNORMAL HIGH (ref 70–99)
Potassium: 4.1 mmol/L (ref 3.5–5.1)
Sodium: 135 mmol/L (ref 135–145)
Total Bilirubin: 0.4 mg/dL (ref 0.3–1.2)
Total Protein: 6.8 g/dL (ref 6.5–8.1)

## 2018-09-17 LAB — D-DIMER, QUANTITATIVE: D-Dimer, Quant: 0.4 ug/mL-FEU (ref 0.00–0.50)

## 2018-09-17 LAB — CBC WITH DIFFERENTIAL/PLATELET
Abs Immature Granulocytes: 0 10*3/uL (ref 0.00–0.07)
Basophils Absolute: 0 10*3/uL (ref 0.0–0.1)
Basophils Relative: 1 %
Eosinophils Absolute: 0.1 10*3/uL (ref 0.0–0.5)
Eosinophils Relative: 1 %
HCT: 40.4 % (ref 39.0–52.0)
Hemoglobin: 13.3 g/dL (ref 13.0–17.0)
Immature Granulocytes: 0 %
Lymphocytes Relative: 30 %
Lymphs Abs: 1.3 10*3/uL (ref 0.7–4.0)
MCH: 30.7 pg (ref 26.0–34.0)
MCHC: 32.9 g/dL (ref 30.0–36.0)
MCV: 93.3 fL (ref 80.0–100.0)
Monocytes Absolute: 0.5 10*3/uL (ref 0.1–1.0)
Monocytes Relative: 10 %
Neutro Abs: 2.6 10*3/uL (ref 1.7–7.7)
Neutrophils Relative %: 58 %
Platelets: 238 10*3/uL (ref 150–400)
RBC: 4.33 MIL/uL (ref 4.22–5.81)
RDW: 13.5 % (ref 11.5–15.5)
WBC: 4.4 10*3/uL (ref 4.0–10.5)
nRBC: 0 % (ref 0.0–0.2)

## 2018-09-17 LAB — TROPONIN I
Troponin I: 0.04 ng/mL (ref <0.03)
Troponin I: 0.04 ng/mL (ref <0.03)
Troponin I: 0.03 ng/mL (ref <0.03)
Troponin I: 0.04 ng/mL (ref <0.03)

## 2018-09-17 LAB — ETHANOL: Alcohol, Ethyl (B): <10 mg/dL (ref <10)

## 2018-09-17 LAB — RAPID URINE DRUG SCREEN, HOSP PERFORMED
Amphetamines: NOT DETECTED
Barbiturates: NOT DETECTED
Benzodiazepines: NOT DETECTED
Cocaine: NOT DETECTED
Opiates: NOT DETECTED
Tetrahydrocannabinol: NOT DETECTED

## 2018-09-17 LAB — ACETAMINOPHEN LEVEL: Acetaminophen (Tylenol), Serum: <10 ug/mL — ABNORMAL LOW (ref 10–30)

## 2018-09-17 LAB — HIV ANTIBODY (ROUTINE TESTING W REFLEX): HIV Screen 4th Generation wRfx: NONREACTIVE

## 2018-09-17 LAB — SALICYLATE LEVEL: Salicylate Lvl: <7 mg/dL (ref 2.8–30.0)

## 2018-09-17 LAB — HEPARIN LEVEL (UNFRACTIONATED)
Heparin Unfractionated: 0.98 IU/mL — ABNORMAL HIGH (ref 0.30–0.70)
Heparin Unfractionated: 0.56 IU/mL (ref 0.30–0.70)

## 2018-09-17 LAB — BRAIN NATRIURETIC PEPTIDE: B Natriuretic Peptide: 21.6 pg/mL (ref 0.0–100.0)

## 2018-09-17 MED ORDER — SODIUM CHLORIDE 0.9% FLUSH: 10.0000 mL | INTRAVENOUS | Status: DC | PRN

## 2018-09-17 MED ORDER — ALUM & MAG HYDROXIDE-SIMETH 200-200-20 MG/5ML PO SUSP
30.0000 mL | Freq: Once | ORAL | Status: AC
  Administered 2018-09-17: 30 mL via ORAL
  Filled 2018-09-17: qty 30

## 2018-09-17 MED ORDER — ACETAMINOPHEN 325 MG PO TABS: 650.0000 mg | ORAL_TABLET | ORAL | Status: DC | PRN

## 2018-09-17 MED ORDER — TAMSULOSIN HCL 0.4 MG PO CAPS
0.4000 mg | ORAL_CAPSULE | Freq: Every day | ORAL | Status: DC
  Administered 2018-09-17 – 2018-09-20 (×4): 0.4 mg via ORAL
  Filled 2018-09-17 (×4): qty 1

## 2018-09-17 MED ORDER — DICYCLOMINE HCL 10 MG PO CAPS
10.0000 mg | ORAL_CAPSULE | Freq: Every day | ORAL | Status: DC
  Administered 2018-09-17 – 2018-09-20 (×4): 10 mg via ORAL
  Filled 2018-09-17 (×4): qty 1

## 2018-09-17 MED ORDER — LORAZEPAM 1 MG PO TABS: 1.0000 mg | ORAL_TABLET | Freq: Three times a day (TID) | ORAL | Status: DC | PRN

## 2018-09-17 MED ORDER — HEPARIN BOLUS VIA INFUSION
4000.0000 [IU] | Freq: Once | INTRAVENOUS | Status: AC
  Administered 2018-09-17: 07:00:00 4000 [IU] via INTRAVENOUS
  Filled 2018-09-17: qty 4000

## 2018-09-17 MED ORDER — ONDANSETRON HCL 4 MG/2ML IJ SOLN: 4.0000 mg | Freq: Four times a day (QID) | INTRAMUSCULAR | Status: DC | PRN

## 2018-09-17 MED ORDER — FLUOXETINE HCL 20 MG PO CAPS
40.0000 mg | ORAL_CAPSULE | Freq: Every day | ORAL | Status: DC
  Administered 2018-09-17 – 2018-09-20 (×4): 40 mg via ORAL
  Filled 2018-09-17 (×4): qty 2

## 2018-09-17 MED ORDER — GABAPENTIN 400 MG PO CAPS
400.0000 mg | ORAL_CAPSULE | Freq: Two times a day (BID) | ORAL | Status: DC
  Administered 2018-09-17 – 2018-09-20 (×7): 400 mg via ORAL
  Filled 2018-09-17 (×7): qty 1

## 2018-09-17 MED ORDER — ASPIRIN EC 325 MG PO TBEC
325.0000 mg | DELAYED_RELEASE_TABLET | Freq: Every day | ORAL | Status: DC
  Administered 2018-09-17 – 2018-09-18 (×2): 325 mg via ORAL
  Filled 2018-09-17 (×2): qty 1

## 2018-09-17 MED ORDER — HEPARIN (PORCINE) 25000 UT/250ML-% IV SOLN
750.0000 [IU]/h | INTRAVENOUS | Status: DC
  Administered 2018-09-17: 1000 [IU]/h via INTRAVENOUS
  Administered 2018-09-18: 750 [IU]/h via INTRAVENOUS
  Filled 2018-09-17 (×2): qty 250

## 2018-09-17 MED ORDER — ATORVASTATIN CALCIUM 80 MG PO TABS
80.0000 mg | ORAL_TABLET | Freq: Every day | ORAL | Status: DC
  Administered 2018-09-17 – 2018-09-19 (×3): 80 mg via ORAL
  Filled 2018-09-17 (×3): qty 1

## 2018-09-17 MED ORDER — METOPROLOL TARTRATE 12.5 MG HALF TABLET
12.5000 mg | ORAL_TABLET | Freq: Two times a day (BID) | ORAL | Status: DC
  Administered 2018-09-17 – 2018-09-19 (×3): 12.5 mg via ORAL
  Filled 2018-09-17 (×4): qty 1

## 2018-09-17 MED ORDER — OLANZAPINE 5 MG PO TABS
5.0000 mg | ORAL_TABLET | Freq: Every day | ORAL | Status: DC
  Administered 2018-09-17 – 2018-09-19 (×4): 5 mg via ORAL
  Filled 2018-09-17 (×4): qty 1

## 2018-09-17 MED ORDER — VITAMIN D 25 MCG (1000 UNIT) PO TABS
1000.0000 [IU] | ORAL_TABLET | Freq: Every day | ORAL | Status: DC
  Administered 2018-09-17 – 2018-09-20 (×4): 1000 [IU] via ORAL
  Filled 2018-09-17 (×4): qty 1

## 2018-09-17 NOTE — Progress Notes (Signed)
ANTICOAGULATION CONSULT NOTE - Initial Consult  Pharmacy Consult for Heparin  Indication: elevated troponin   Allergies  Allergen Reactions  . Bee Venom Swelling    Swelling at site of sting  . Erythromycin Diarrhea    Patient Measurements: Height: 5\' 3"  (160 cm) Weight: 184 lb (83.5 kg) IBW/kg (Calculated) : 56.9 Vital Signs: Temp: 99.5 F (37.5 C) (04/25 2316) Temp Source: Rectal (04/25 2316) BP: 138/83 (04/26 0230) Pulse Rate: 67 (04/26 0230)  Labs: Recent Labs    09/16/18 2357  HGB 13.3  HCT 40.4  PLT 238  CREATININE 1.15  TROPONINI 0.04*    Estimated Creatinine Clearance: 70.1 mL/min (by C-G formula based on SCr of 1.15 mg/dL).  Medical History: Past Medical History:  Diagnosis Date  . Anxiety   . Bipolar 2 disorder (HCC)   . Depression   . GERD (gastroesophageal reflux disease)   . Hyperhydrosis disorder     Assessment: 53 y/o M starting heparin drip for mildly elevated troponin. CBC/renal function good. D-dimer 0.4.   Goal of Therapy:  Heparin level 0.3-0.7 units/ml Monitor platelets by anticoagulation protocol: Yes   Plan:  Heparin 4000 units BOLUS Start heparin drip at 1000 units/hr 1200 HL Daily CBC/HL Monitor for bleeding   Abran Duke 09/17/2018,3:14 AM

## 2018-09-17 NOTE — Progress Notes (Signed)
Blood pressure "soft" re-checked again 100/65. Patient with no complaints, resting in the bed.

## 2018-09-17 NOTE — Consult Note (Addendum)
Cardiology Consult    Patient ID: Gary [name] MRN: [id number], DOB/AGE: 55/25/1967   Admit date: 09/16/2018 Date of Consult: 09/17/2018  Primary Physician: System, Pcp Not In Primary Cardiologist: No primary care provider on file.   Past Medical History   Past Medical History:  Diagnosis Date  . Anxiety   . Bipolar 2 disorder (HCC)   . Depression   . GERD (gastroesophageal reflux disease)   . Hyperhydrosis disorder     Past Surgical History:  Procedure Laterality Date  . HERNIA REPAIR    . HIP SURGERY Right      Allergies  Allergies  Allergen Reactions  . Bee Venom Swelling    Swelling at site of sting  . Erythromycin Diarrhea    History of Present Illness    53 year old man with a past medical history of anxiety disorder and depression presents to the emergency room with new onset shortness of breath.  No known past medical history involving his cardiovascular system.  Never had an echo cath.  He has been ambulating in [employer] 2 days ago and noted to have extreme exertional dyspnea.  He usually uses the motorized scooter to get around given history of hip surgery.  This time around there was no scooter available so he was forced to walk and became very short of breath.  Says that he has been suffering from severe depression has been under a lot of mental stress at home.  troponin in the ED was elevated.  Inpatient Medications    . alum & mag hydroxide-simeth  30 mL Oral Once  . aspirin EC  325 mg Oral Daily  . cholecalciferol  1,000 Units Oral BH-q7a  . dicyclomine  10 mg Oral BH-q7a  . FLUoxetine  40 mg Oral BH-q7a  . gabapentin  400 mg Oral BID  . heparin  4,000 Units Intravenous Once  . OLANZapine  5 mg Oral QHS  . tamsulosin  0.4 mg Oral BH-q7a    Family History    No family history on file. He indicated that his mother is alive.   Social History    Social History   Socioeconomic History  . Marital status: Single    Spouse name: Not on file   . Number of children: Not on file  . Years of education: Not on file  . Highest education level: Not on file  Occupational History  . Not on file  Social Needs  . Financial resource strain: Not on file  . Food insecurity:    Worry: Not on file    Inability: Not on file  . Transportation needs:    Medical: Not on file    Non-medical: Not on file  Tobacco Use  . Smoking status: Never Smoker  . Smokeless tobacco: Never Used  Substance and Sexual Activity  . Alcohol use: No  . Drug use: No  . Sexual activity: Not on file  Lifestyle  . Physical activity:    Days per week: Not on file    Minutes per session: Not on file  . Stress: Not on file  Relationships  . Social connections:    Talks on phone: Not on file    Gets together: Not on file    Attends religious service: Not on file    Active member of club or organization: Not on file    Attends meetings of clubs or organizations: Not on file    Relationship status: Not on file  . Intimate partner violence:  Fear of current or ex partner: Not on file    Emotionally abused: Not on file    Physically abused: Not on file    Forced sexual activity: Not on file  Other Topics Concern  . Not on file  Social History Narrative  . Not on file     Review of Systems    General:  No chills, fever, night sweats or weight changes.  Cardiovascular:  No chest pain, dyspnea on exertion, edema, orthopnea, palpitations, paroxysmal nocturnal dyspnea. Dermatological: No rash, lesions/masses Respiratory: No cough, dyspnea Urologic: No hematuria, dysuria Abdominal:   No nausea, vomiting, diarrhea, bright red blood per rectum, melena, or hematemesis Neurologic:  No visual changes, wkns, changes in mental status. All other systems reviewed and are otherwise negative except as noted above.  Physical Exam    Blood pressure (!) 151/106, pulse 75, temperature 99.5 F (37.5 C), temperature source Rectal, resp. rate 13, height 5\' 3"  (1.6 m),  weight 83.5 kg, SpO2 99 %.  General: Anxious Psych: Normal affect. Neuro: Alert and oriented X 3. Moves all extremities spontaneously. HEENT: Normal  Neck: Supple without bruits or JVD. Lungs:  Resp regular and unlabored, CTA. Heart: RRR no s3, s4, or murmurs. Abdomen: Soft, non-tender, non-distended, BS + x 4.  Extremities: Rash bilaterally on the legs  Labs    Troponin (Point of Care Test) No results for input(s): TROPIPOC in the last 72 hours. Recent Labs    09/16/18 2357  TROPONINI 0.04*   Lab Results  Component Value Date   WBC 4.4 09/16/2018   HGB 13.3 09/16/2018   HCT 40.4 09/16/2018   MCV 93.3 09/16/2018   PLT 238 09/16/2018    Recent Labs  Lab 09/16/18 2357  NA 135  K 4.1  CL 101  CO2 23  BUN 13  CREATININE 1.15  CALCIUM 9.4  PROT 6.8  BILITOT 0.4  ALKPHOS 92  ALT 16  AST 23  GLUCOSE 105*   No results found for: CHOL, HDL, LDLCALC, TRIG Lab Results  Component Value Date   DDIMER 0.40 09/16/2018     Radiology Studies    Dg Chest 2 View  Result Date: 09/16/2018 CLINICAL DATA:  Shortness of breath EXAM: CHEST - 2 VIEW COMPARISON:  08/03/2018 FINDINGS: The heart size and mediastinal contours are within normal limits. Both lungs are clear. The visualized skeletal structures are unremarkable. IMPRESSION: No active cardiopulmonary disease. Electronically Signed   By: Jasmine PangKim  Fujinaga M.D.   On: 09/16/2018 23:41   Dg Hip Unilat With Pelvis 2-3 Views Right  Result Date: 09/16/2018 CLINICAL DATA:  Hip pain EXAM: DG HIP (WITH OR WITHOUT PELVIS) 2-3V RIGHT COMPARISON:  04/18/2018 FINDINGS: SI joints are non widened. Pubic symphysis and rami are intact. Status post right hip replacement with normal alignment. No fracture seen IMPRESSION: Status post right hip replacement without acute osseous abnormality Electronically Signed   By: Jasmine PangKim  Fujinaga M.D.   On: 09/16/2018 23:42    ECG & Cardiac Imaging  Normal sinus rhythm no evidence of ischemia.  Assessment &  Plan    NSTEMI.  Troponin elevation with normal EKG.  Currently asymptomatic.  Report exertional dyspnea few days ago.  History of cardiovascular disease.  Plan: -Admission to medicine -Heparin drip aspirin and plan for left heart catheterization early in the week -Start on atorvastatin 80, can consider giving low-dose metoprolol pressure permits\ -TTE in the morning  Signed, Macario GoldsMarat Saniah Schroeter, MD 09/17/2018, 2:24 AM  For questions or updates, please contact  Please consult [email or web address] for contact info under Cardiology/STEMI.

## 2018-09-17 NOTE — H&P (Signed)
History and Physical    Gary [name] WGN:[id number] DOB: 1965-12-01 DOA: 09/16/2018  PCP: System, Pcp Not In  Patient coming from: Home  Chief Complaint: Shortness of breath  HPI: Gary [name] is a 53 y.o. male with medical history significant of anxiety and depression and bipolar disorder comes in with less than 12 hours of shortness of breath.  Patient reports he normally gets chest pain and shortness of breath with his anxiety however today he is just short of breath so has not what he usually feels when he has anxiety attacks.  He denies any fevers or cough.  He denies any lower extremity edema or swelling.  He denies any recent illnesses.  He denies any chest pain.  Denies any nausea vomiting or diarrhea.  Cardiology was consulted and is advising heart catheterization for troponin of 0.04.  Patient is being referred for admission for heart catheterization per cardiology recommendations.  Review of Systems: As per HPI otherwise 10 point review of systems negative.   Past Medical History:  Diagnosis Date  . Anxiety   . Bipolar 2 disorder (HCC)   . Depression   . GERD (gastroesophageal reflux disease)   . Hyperhydrosis disorder     Past Surgical History:  Procedure Laterality Date  . HERNIA REPAIR    . HIP SURGERY Right      reports that he has never smoked. He has never used smokeless tobacco. He reports that he does not drink alcohol or use drugs.  Allergies  Allergen Reactions  . Bee Venom Swelling    Swelling at site of sting  . Erythromycin Diarrhea    No family history on file.  No premature coronary artery disease  Prior to Admission medications   Medication Sig Start Date End Date Taking? Authorizing Provider  cholecalciferol (VITAMIN D3) 25 MCG (1000 UT) tablet Take 1,000 Units by mouth every morning.   Yes [provider]  dicyclomine (BENTYL) 10 MG capsule Take 10 mg by mouth every morning.   Yes [provider]  FLUoxetine (PROZAC) 40 MG  capsule Take 40 mg by mouth every morning.   Yes [provider]  gabapentin (NEURONTIN) 400 MG capsule Take 400 mg by mouth 2 (two) times daily. Morning and evening   Yes [provider]  LORazepam (ATIVAN) 1 MG tablet Take 1 tablet (1 mg total) by mouth 3 (three) times daily as needed for anxiety. 08/11/18  Yes Ward, Kristen N, DO  OLANZapine (ZYPREXA) 5 MG tablet Take 5 mg by mouth at bedtime.   Yes [provider]  ranitidine (ZANTAC) 150 MG capsule Take 150 mg by mouth 2 (two) times daily. Morning and evening   Yes [provider]  tamsulosin (FLOMAX) 0.4 MG CAPS capsule Take 0.4 mg by mouth every morning.   Yes [provider]    Physical Exam: Vitals:   09/16/18 2232 09/16/18 2236 09/16/18 2315 09/16/18 2316  BP:  (!) 132/92  135/87  Pulse:  88 72 78  Resp:  Temp:  98.4 F (36.9 C)  99.5 F (37.5 C)  TempSrc:    Rectal  SpO2:  100% 97% 98%  Weight: 83.5 kg     Height:  (1.6 m)         Constitutional: NAD, calm, comfortable Vitals:   09/16/18 2232 09/16/18 2236 09/16/18 2315 09/16/18 2316  BP:  (!) 132/92  135/87  Pulse:  88 72 78  Resp:  16 19 17  Temp:  98.4 F (36.9 C)  99.5 F (37.5 C)  TempSrc:    Rectal  SpO2:  100% 97% 98%  Weight: 83.5 kg     Height: 5\' 3"  (1.6 m)      Eyes: PERRL, lids and conjunctivae normal ENMT: Mucous membranes are moist. Posterior pharynx clear of any exudate or lesions.Normal dentition.  Neck: normal, supple, no masses, no thyromegaly Respiratory: clear to auscultation bilaterally, no wheezing, no crackles. Normal respiratory effort. No accessory muscle use.  Cardiovascular: Regular rate and rhythm, no murmurs / rubs / gallops. No extremity edema. 2+ pedal pulses. No carotid bruits.  Abdomen: no tenderness, no masses palpated. No hepatosplenomegaly. Bowel sounds positive.  Musculoskeletal: no clubbing / cyanosis. No joint deformity upper and lower extremities. Good ROM, no  contractures. Normal muscle tone.  Skin: no rashes, lesions, ulcers. No induration Neurologic: CN 2-12 grossly intact. Sensation intact, DTR normal. Strength 5/5 in all 4.  Psychiatric: Normal judgment and insight. Alert and oriented x 3. Normal mood.    Labs on Admission: I have personally reviewed following labs and imaging studies  CBC: Recent Labs  Lab 09/16/18 2357  WBC 4.4  NEUTROABS 2.6  HGB 13.3  HCT 40.4  MCV 93.3  PLT 238   Basic Metabolic Panel: Recent Labs  Lab 09/16/18 2357  NA 135  K 4.1  CL 101  CO2 23  GLUCOSE 105*  BUN 13  CREATININE 1.15  CALCIUM 9.4   GFR: Estimated Creatinine Clearance: 70.1 mL/min (by C-G formula based on SCr of 1.15 mg/dL). Liver Function Tests: Recent Labs  Lab 09/16/18 2357  AST 23  ALT 16  ALKPHOS 92  BILITOT 0.4  PROT 6.8  ALBUMIN 3.8   No results for input(s): LIPASE, AMYLASE in the last 168 hours. No results for input(s): AMMONIA in the last 168 hours. Coagulation Profile: No results for input(s): INR, PROTIME in the last 168 hours. Cardiac Enzymes: Recent Labs  Lab 09/16/18 2357  TROPONINI 0.04*   BNP (last 3 results) No results for input(s): PROBNP in the last 8760 hours. HbA1C: No results for input(s): HGBA1C in the last 72 hours. CBG: No results for input(s): GLUCAP in the last 168 hours. Lipid Profile: No results for input(s): CHOL, HDL, LDLCALC, TRIG, CHOLHDL, LDLDIRECT in the last 72 hours. Thyroid Function Tests: No results for input(s): TSH, T4TOTAL, FREET4, T3FREE, THYROIDAB in the last 72 hours. Anemia Panel: No results for input(s): VITAMINB12, FOLATE, FERRITIN, TIBC, IRON, RETICCTPCT in the last 72 hours. Urine analysis:    Component Value Date/Time   COLORURINE YELLOW 08/10/2018 2200   APPEARANCEUR CLEAR 08/10/2018 2200   LABSPEC 1.009 08/10/2018 2200   PHURINE 6.0 08/10/2018 2200   GLUCOSEU NEGATIVE 08/10/2018 2200   HGBUR NEGATIVE 08/10/2018 2200   BILIRUBINUR NEGATIVE 08/10/2018  2200   KETONESUR NEGATIVE 08/10/2018 2200   PROTEINUR NEGATIVE 08/10/2018 2200   NITRITE NEGATIVE 08/10/2018 2200   LEUKOCYTESUR NEGATIVE 08/10/2018 2200   Sepsis Labs: !!!!!!!!!!!!!!!!!!!!!!!!!!!!!!!!!!!!!!!!!!!! @LABRCNTIP (procalcitonin:4,lacticidven:4) )No results found for this or any previous visit (from the past 240 hour(s)).   Radiological Exams on Admission: Dg Chest 2 View  Result Date: 09/16/2018 CLINICAL DATA:  Shortness of breath EXAM: CHEST - 2 VIEW COMPARISON:  08/03/2018 FINDINGS: The heart size and mediastinal contours are within normal limits. Both lungs are clear. The visualized skeletal structures are unremarkable. IMPRESSION: No active cardiopulmonary disease. Electronically Signed   By: Jasmine Pang M.D.   On: 09/16/2018 23:41   Dg Hip Unilat With Pelvis  2-3 Views Right  Result Date: 09/16/2018 CLINICAL DATA:  Hip pain EXAM: DG HIP (WITH OR WITHOUT PELVIS) 2-3V RIGHT COMPARISON:  04/18/2018 FINDINGS: SI joints are non widened. Pubic symphysis and rami are intact. Status post right hip replacement with normal alignment. No fracture seen IMPRESSION: Status post right hip replacement without acute osseous abnormality Electronically Signed   By: Jasmine PangKim  Fujinaga M.D.   On: 09/16/2018 23:42    EKG: Independently reviewed.  Normal sinus rhythm no acute changes Old chart reviewed  Case discussed with EDP Chest x-ray reviewed no edema or infiltrate  Assessment/Plan 53 year old male with shortness of breath Principal Problem:   SOB (shortness of breath)-troponin 0 0.04.  Serial troponin.  Cardiology consulted recommending heart catheterization and heparin drip.  Further recommendations per cardiology team.  Keep n.p.o. for now.  EKG is nonischemic.  Symptoms are very atypical.  Active Problems:   GAD (generalized anxiety disorder)-noted   GERD (gastroesophageal reflux disease)-noted     DVT prophylaxis: Heparin drip Code Status: Full Family Communication: None  Disposition Plan: 1 to 2 days Consults called: Cardiology Admission status: Observation   Isador Castille A MD [facility]  If 7PM-7AM, please contact night-coverage [email or web address] Password [email or web address]  09/17/2018, 2:12 AM

## 2018-09-17 NOTE — Progress Notes (Signed)
Pt arrive to 3E13 from [facility]. A&Ox4. No complaints of pain. SR on telemetry. VSS. Pt has hyperpigmentation to bilateral lower legs. Gen plan of care initiated. Patient has no intent of self harm at this time. Moderate fall risk.  Patient oriented to room and call system.

## 2018-09-17 NOTE — ED Notes (Signed)
ED TO INPATIENT HANDOFF REPORT  ED Nurse Name and Phone #:  [phone] Patty  S Name/Age/Gender Gary [name] 53 y.o. male Room/Bed: 026C/026C  Code Status   Code Status: Full Code  Home/SNF/Other Home Patient oriented to: self, place, time and situation Is this baseline? Yes   Triage Complete: Triage complete  Chief Complaint SI  Triage Note Pt from home w/ a c/o SI. Not HI. Presented as anxious w/ EMS. Pt has been feeling depressed and has had little energy recently. Has not had anything to eat today bc of lack of energy. He lives alone and is complaint w/ meds.   124/68 HR 90 RR 18   Allergies Allergies  Allergen Reactions  . Bee Venom Swelling    Swelling at site of sting  . Erythromycin Diarrhea    Level of Care/Admitting Diagnosis ED Disposition    ED Disposition Condition Comment   Admit  [facility] [[id number]]  Level of Care: Telemetry Cardiac [103]  I expect the patient will be discharged within 24 hours: Yes  LOW acuity---Tx typically complete <24 hrs---ACUTE conditions typically can be evaluated <24 hours---LABS likely to return to acceptable levels <24 hours---IS near functional baseline---EXPECTED to return to current living arrangement---NOT newly hypoxic: Meets criteria for 5C-Observation unit  Covid Evaluation: N/A  Diagnosis: SOB (shortness of breath) [960454][241880]  Admitting Physician: Haydee MonicaAVID, RACHAL A [[id number]]  Attending Physician: Tarry KosAVID, RACHAL A [[id number]]  PT Class (Do Not Modify): Observation [104]  PT Acc Code (Do Not Modify): Observation [10022]       B Medical/Surgery History Past Medical History:  Diagnosis Date  . Anxiety   . Bipolar 2 disorder (HCC)   . Depression   . GERD (gastroesophageal reflux disease)   . Hyperhydrosis disorder    Past Surgical History:  Procedure Laterality Date  . HERNIA REPAIR    . HIP SURGERY Right      A IV Location/Drains/Wounds Patient Lines/Drains/Airways Status   Active  Line/Drains/Airways    None          Intake/Output Last 24 hours No intake or output data in the 24 hours ending 09/17/18 0216  Labs/Imaging Results for orders placed or performed during the hospital encounter of 09/16/18 (from the past 48 hour(s))  CBC with Differential/Platelet     Status: None   Collection Time: 09/16/18 11:57 PM  Result Value Ref Range   WBC 4.4 4.0 - 10.5 K/uL   RBC 4.33 4.22 - 5.81 MIL/uL   Hemoglobin 13.3 13.0 - 17.0 g/dL   HCT 09.840.4 11.939.0 - 14.752.0 %   MCV 93.3 80.0 - 100.0 fL   MCH 30.7 26.0 - 34.0 pg   MCHC 32.9 30.0 - 36.0 g/dL   RDW 82.913.5 56.211.5 - 13.015.5 %   Platelets 238 150 - 400 K/uL   nRBC 0.0 0.0 - 0.2 %   Neutrophils Relative % 58 %   Neutro Abs 2.6 1.7 - 7.7 K/uL   Lymphocytes Relative 30 %   Lymphs Abs 1.3 0.7 - 4.0 K/uL   Monocytes Relative 10 %   Monocytes Absolute 0.5 0.1 - 1.0 K/uL   Eosinophils Relative 1 %   Eosinophils Absolute 0.1 0.0 - 0.5 K/uL   Basophils Relative 1 %   Basophils Absolute 0.0 0.0 - 0.1 K/uL   Immature Granulocytes 0 %   Abs Immature Granulocytes 0.00 0.00 - 0.07 K/uL    Comment: Performed at [facility], [address] [place],  [place] [address]  Comprehensive metabolic panel     Status: Abnormal   Collection Time: 09/16/18 11:57 PM  Result Value Ref Range   Sodium 135 135 - 145 mmol/L   Potassium 4.1 3.5 - 5.1 mmol/L   Chloride 101 98 - 111 mmol/L   CO2 23 22 - 32 mmol/L   Glucose, Bld 105 (H) 70 - 99 mg/dL   BUN 13 6 - 20 mg/dL   Creatinine, Ser 1.91 0.61 - 1.24 mg/dL   Calcium 9.4 8.9 - 47.8 mg/dL   Total Protein 6.8 6.5 - 8.1 g/dL   Albumin 3.8 3.5 - 5.0 g/dL   AST 23 15 - 41 U/L   ALT 16 0 - 44 U/L   Alkaline Phosphatase 92 38 - 126 U/L   Total Bilirubin 0.4 0.3 - 1.2 mg/dL   GFR calc non Af Amer >60 >60 mL/min   GFR calc Af Amer >60 >60 mL/min   Anion gap 11 5 - 15    Comment: Performed at [facility], [address] [place], [place] [address]  Acetaminophen level     Status:  Abnormal   Collection Time: 09/16/18 11:57 PM  Result Value Ref Range   Acetaminophen (Tylenol), Serum <10 (L) 10 - 30 ug/mL    Comment: (NOTE) Therapeutic concentrations vary significantly. A range of 10-30 ug/mL  may be an effective concentration for many patients. However, some  are best treated at concentrations outside of this range. Acetaminophen concentrations >150 ug/mL at 4 hours after ingestion  and >50 ug/mL at 12 hours after ingestion are often associated with  toxic reactions. Performed at [facility], [address], [place], [place] [address]   Salicylate level     Status: None   Collection Time: 09/16/18 11:57 PM  Result Value Ref Range   Salicylate Lvl <7.0 2.8 - 30.0 mg/dL    Comment: Performed at [facility], [address], [place], [place] [address]  Ethanol     Status: None   Collection Time: 09/16/18 11:57 PM  Result Value Ref Range   Alcohol, Ethyl (B) <10 <10 mg/dL    Comment: (NOTE) Lowest detectable limit for serum alcohol is 10 mg/dL. For medical purposes only. Performed at [facility], [address], [place], [place] [address]   D-dimer, quantitative (not at [facility])     Status: None   Collection Time: 09/16/18 11:57 PM  Result Value Ref Range   D-Dimer, Quant 0.40 0.00 - 0.50 ug/mL-FEU    Comment: (NOTE) At the manufacturer cut-off of 0.50 ug/mL FEU, this assay has been documented to exclude PE with a sensitivity and negative predictive value of 97 to 99%.  At this time, this assay has not been approved by the FDA to exclude DVT/VTE. Results should be correlated with clinical presentation. Performed at [facility], [address] [place], [place] [address]   Troponin I - ONCE - STAT     Status: Abnormal   Collection Time: 09/16/18 11:57 PM  Result Value Ref Range   Troponin I 0.04 (HH) <0.03 ng/mL    Comment: CRITICAL RESULT CALLED TO, READ BACK BY AND VERIFIED WITH: Israa Caban P,RN 09/17/18 0120 WAYK Performed at  [facility], [address], [place], [place] [address]    Dg Chest 2 View  Result Date: 09/16/2018 CLINICAL DATA:  Shortness of breath EXAM: CHEST - 2 VIEW COMPARISON:  08/03/2018 FINDINGS: The heart size and mediastinal contours  are within normal limits. Both lungs are clear. The visualized skeletal structures are unremarkable. IMPRESSION: No active cardiopulmonary disease. Electronically Signed   By: Jasmine Pang M.D.   On: 09/16/2018 23:41   Dg Hip Unilat With Pelvis 2-3 Views Right  Result Date: 09/16/2018 CLINICAL DATA:  Hip pain EXAM: DG HIP (WITH OR WITHOUT PELVIS) 2-3V RIGHT COMPARISON:  04/18/2018 FINDINGS: SI joints are non widened. Pubic symphysis and rami are intact. Status post right hip replacement with normal alignment. No fracture seen IMPRESSION: Status post right hip replacement without acute osseous abnormality Electronically Signed   By: Jasmine Pang M.D.   On: 09/16/2018 23:42    Pending Labs Unresulted Labs (From admission, onward)    Start     Ordered   09/17/18 0212  HIV antibody (Routine Testing)  Once,   R     09/17/18 0212   09/17/18 0212  Troponin I - Now Then Q3H  Now then every 3 hours,   TIMED     09/17/18 0212   09/17/18 0207  Brain natriuretic peptide  Once,   R     09/17/18 0206   09/16/18 2302  Rapid urine drug screen (hospital performed)  ONCE - STAT,   R     09/16/18 2302          Vitals/Pain Today's Vitals   09/16/18 2232 09/16/18 2236 09/16/18 2315 09/16/18 2316  BP:  (!) 132/92  135/87  Pulse:  88 72 78  Resp:  16 19 17   Temp:  98.4 F (36.9 C)  99.5 F (37.5 C)  TempSrc:    Rectal  SpO2:  100% 97% 98%  Weight: 83.5 kg     Height: 5\' 3"  (1.6 m)     PainSc: 0-No pain       Isolation Precautions No active isolations  Medications Medications  heparin bolus via infusion 4,000 Units (has no administration in time range)  heparin ADULT infusion 100 units/mL (25000 units/217mL sodium chloride 0.45%) (has no administration in  time range)  tamsulosin (FLOMAX) capsule 0.4 mg (has no administration in time range)  dicyclomine (BENTYL) capsule 10 mg (has no administration in time range)  cholecalciferol (VITAMIN D3) tablet 1,000 Units (has no administration in time range)  FLUoxetine (PROZAC) capsule 40 mg (has no administration in time range)  gabapentin (NEURONTIN) capsule 400 mg (has no administration in time range)  OLANZapine (ZYPREXA) tablet 5 mg (has no administration in time range)  LORazepam (ATIVAN) tablet 1 mg (has no administration in time range)  acetaminophen (TYLENOL) tablet 650 mg (has no administration in time range)  ondansetron (ZOFRAN) injection 4 mg (has no administration in time range)  aspirin EC tablet 325 mg (has no administration in time range)  alum & mag hydroxide-simeth (MAALOX/MYLANTA) 200-200-20 MG/5ML suspension 30 mL (has no administration in time range)    Mobility walks   w/ device  Focused Assessments    R Recommendations: See Admitting Provider Note  Report given to:   Additional Notes:

## 2018-09-17 NOTE — Progress Notes (Signed)
Contacted MD  Jonathon Bellows Ramesh,per MD ok  To give all morning medicine.
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
## 2018-09-17 NOTE — Progress Notes (Signed)
Patient seen and examined personally, I reviewed the chart, history and physical and admission note, done by admitting physician this morning and agree with the same with following addendum.  Please refer to the morning admission note for more detailed plan of care.  Briefly, 53 year old male with history of anxiety disorder, depression, bipolar, presented to the ER with 12 hours of shortness of breath.  Patient reported getting chest pain normally along shortness of breath with his anxiety however this time he was just short of breath that is different from his previous anxiety attacks, no fever or cough or lower extremity edema.  In the ER troponin was 0.04, patient was placed in heparin drip, seen by cardiology and was admitted.  Patient feels well denies chest pain.  Nontoxic looking and lungs are clear on exam.  Shortness of breath with positive troponin possible non-ST elevation MI, however troponin B flat, with no chest pain index of suspicion is low for myocardial ischemia as per cardiology plan for stress test in the morning and continue heparin drip for now.  Also on aspirin, statin.  Other issues including anxiety depression/bipolar stable.

## 2018-09-17 NOTE — Progress Notes (Signed)
ANTICOAGULATION CONSULT NOTE  Pharmacy Consult for Heparin  Indication: elevated troponin   Allergies  Allergen Reactions  . Bee Venom Swelling    Swelling at site of sting  . Erythromycin Diarrhea    Patient Measurements: Height: 5\' 3"  (160 cm) Weight: 119 lb 14.4 oz (54.4 kg) IBW/kg (Calculated) : 56.9 Vital Signs: Temp: 98.3 F (36.8 C) (04/26 1630) Temp Source: Oral (04/26 1630) BP: 100/65 (04/26 1657) Pulse Rate: 55 (04/26 1657)  Labs: Recent Labs    09/16/18 2357 09/17/18 0227 09/17/18 0531 09/17/18 0721 09/17/18 1124 09/17/18 1832  HGB 13.3  --   --   --   --   --   HCT 40.4  --   --   --   --   --   PLT 238  --   --   --   --   --   HEPARINUNFRC  --   --   --   --  0.98* 0.56  CREATININE 1.15  --   --   --   --   --   TROPONINI 0.04* 0.04* 0.03* 0.04*  --   --     Estimated Creatinine Clearance: 56.5 mL/min (by C-G formula based on SCr of 1.15 mg/dL).  Medical History: Past Medical History:  Diagnosis Date  . Anxiety   . Bipolar 2 disorder (HCC)   . Depression   . GERD (gastroesophageal reflux disease)   . Hyperhydrosis disorder     Assessment: 53 y/o M starting heparin drip for mildly elevated troponin. CBC/renal function good. D-dimer 0.4.   Heparin level came back therapeutic at 0.56, on 750 units/hr. CBC stable. No s/sx of bleeding. No infusion issues.  Goal of Therapy:  Heparin level 0.3-0.7 units/ml Monitor platelets by anticoagulation protocol: Yes   Plan:  Continue IV heparin at 750 units/hr Daily heparin level and CBC. F/u plans for heparin after stress test tomorrow  Sherron Monday, PharmD, BCCCP Clinical Pharmacist  Pager: [phone] Phone: [phone]  09/17/2018 7:13 PM

## 2018-09-17 NOTE — Progress Notes (Signed)
Per Nira Conn, NP pt does not meet criteria for inpt treatment. Pt is recommended to follow up with his current ACTT team to address symptoms of depression. EDP Roxy Horseman, PA-C and pt's nurse Patty, RN have been advised.   Princess Bruins, MSW, LCSW Therapeutic Triage Specialist  [phone]

## 2018-09-17 NOTE — Progress Notes (Signed)
Patient seen and examined.  Patient did not have chest pain, but did have shortness of breath when walking through [employer].  That being said his main complaint is of leg weakness.    His troponin has remained flat and he has not had chest pain.  My index of suspicion for this being due to myocardial ischemia is low.  Unfortunately we are not able to get a stress test today.  We Gary [name] plan for stress testing tomorrow.  The patient states that he would prefer to have this done while in the hospital and would prefer not to go home.  Would continue IV heparin.  Gary [name] [name], MD 09/17/2018 10:01 AM

## 2018-09-17 NOTE — Progress Notes (Signed)
ANTICOAGULATION CONSULT NOTE  Pharmacy Consult for Heparin  Indication: elevated troponin   Allergies  Allergen Reactions  . Bee Venom Swelling    Swelling at site of sting  . Erythromycin Diarrhea    Patient Measurements: Height: 5\' 3"  (160 cm) Weight: 119 lb 14.4 oz (54.4 kg) IBW/kg (Calculated) : 56.9 Vital Signs: Temp: 98.2 F (36.8 C) (04/26 0922) Temp Source: Oral (04/26 0922) BP: 123/80 (04/26 0922) Pulse Rate: 70 (04/26 0922)  Labs: Recent Labs    09/16/18 2357 09/17/18 0227 09/17/18 0531 09/17/18 0721 09/17/18 1124  HGB 13.3  --   --   --   --   HCT 40.4  --   --   --   --   PLT 238  --   --   --   --   HEPARINUNFRC  --   --   --   --  0.98*  CREATININE 1.15  --   --   --   --   TROPONINI 0.04* 0.04* 0.03* 0.04*  --     Estimated Creatinine Clearance: 56.5 mL/min (by C-G formula based on SCr of 1.15 mg/dL).  Medical History: Past Medical History:  Diagnosis Date  . Anxiety   . Bipolar 2 disorder (HCC)   . Depression   . GERD (gastroesophageal reflux disease)   . Hyperhydrosis disorder     Assessment: 53 y/o M starting heparin drip for mildly elevated troponin. CBC/renal function good. D-dimer 0.4.   Initial heparin level above goal at 0.98.  No overt bleeding or complications noted.  Goal of Therapy:  Heparin level 0.3-0.7 units/ml Monitor platelets by anticoagulation protocol: Yes   Plan:  Decrease IV heparin to 750 units/hr Recheck heparin level in 6 hrs Daily heparin level and CBC. F/u plans for heparin after stress test tomorrow  Reece Leader, Colon Flattery, [facility] Clinical Pharmacist Phone [phone]  09/17/2018 12:57 PM

## 2018-09-18 ENCOUNTER — Encounter (HOSPITAL_COMMUNITY): Payer: Self-pay | Admitting: General Practice

## 2018-09-18 ENCOUNTER — Observation Stay (HOSPITAL_COMMUNITY): Payer: Medicaid Other

## 2018-09-18 DIAGNOSIS — I252 Old myocardial infarction: Secondary | ICD-10-CM | POA: Diagnosis not present

## 2018-09-18 DIAGNOSIS — R0602 Shortness of breath: Secondary | ICD-10-CM | POA: Diagnosis present

## 2018-09-18 DIAGNOSIS — K219 Gastro-esophageal reflux disease without esophagitis: Secondary | ICD-10-CM | POA: Diagnosis present

## 2018-09-18 DIAGNOSIS — R001 Bradycardia, unspecified: Secondary | ICD-10-CM | POA: Diagnosis not present

## 2018-09-18 DIAGNOSIS — Z96641 Presence of right artificial hip joint: Secondary | ICD-10-CM | POA: Diagnosis present

## 2018-09-18 DIAGNOSIS — Z79899 Other long term (current) drug therapy: Secondary | ICD-10-CM | POA: Diagnosis not present

## 2018-09-18 DIAGNOSIS — I214 Non-ST elevation (NSTEMI) myocardial infarction: Secondary | ICD-10-CM | POA: Diagnosis present

## 2018-09-18 DIAGNOSIS — N179 Acute kidney failure, unspecified: Secondary | ICD-10-CM | POA: Diagnosis present

## 2018-09-18 DIAGNOSIS — I249 Acute ischemic heart disease, unspecified: Secondary | ICD-10-CM

## 2018-09-18 DIAGNOSIS — Z881 Allergy status to other antibiotic agents status: Secondary | ICD-10-CM | POA: Diagnosis not present

## 2018-09-18 DIAGNOSIS — E860 Dehydration: Secondary | ICD-10-CM | POA: Diagnosis present

## 2018-09-18 DIAGNOSIS — Z91030 Bee allergy status: Secondary | ICD-10-CM | POA: Diagnosis not present

## 2018-09-18 DIAGNOSIS — F3181 Bipolar II disorder: Secondary | ICD-10-CM | POA: Diagnosis present

## 2018-09-18 DIAGNOSIS — R7989 Other specified abnormal findings of blood chemistry: Secondary | ICD-10-CM

## 2018-09-18 DIAGNOSIS — F411 Generalized anxiety disorder: Secondary | ICD-10-CM | POA: Diagnosis present

## 2018-09-18 DIAGNOSIS — I959 Hypotension, unspecified: Secondary | ICD-10-CM | POA: Diagnosis not present

## 2018-09-18 DIAGNOSIS — R45851 Suicidal ideations: Secondary | ICD-10-CM | POA: Diagnosis present

## 2018-09-18 DIAGNOSIS — M25551 Pain in right hip: Secondary | ICD-10-CM | POA: Diagnosis present

## 2018-09-18 DIAGNOSIS — R9439 Abnormal result of other cardiovascular function study: Secondary | ICD-10-CM | POA: Diagnosis not present

## 2018-09-18 LAB — CBC
HCT: 41.5 % (ref 39.0–52.0)
Hemoglobin: 13.7 g/dL (ref 13.0–17.0)
MCH: 31.1 pg (ref 26.0–34.0)
MCHC: 33 g/dL (ref 30.0–36.0)
MCV: 94.1 fL (ref 80.0–100.0)
Platelets: 242 10*3/uL (ref 150–400)
RBC: 4.41 MIL/uL (ref 4.22–5.81)
RDW: 13.5 % (ref 11.5–15.5)
WBC: 4.3 10*3/uL (ref 4.0–10.5)
nRBC: 0 % (ref 0.0–0.2)

## 2018-09-18 LAB — NM MYOCAR MULTI W/SPECT W/WALL MOTION / EF
Estimated workload: 1 METS
Exercise duration (min): 9 min
Exercise duration (sec): 24 s
Peak HR: 69 {beats}/min
Rest HR: 50 {beats}/min

## 2018-09-18 LAB — HEPARIN LEVEL (UNFRACTIONATED): Heparin Unfractionated: 0.39 IU/mL (ref 0.30–0.70)

## 2018-09-18 MED ORDER — REGADENOSON 0.4 MG/5ML IV SOLN
0.4000 mg | Freq: Once | INTRAVENOUS | Status: AC
  Administered 2018-09-18: 0.4 mg via INTRAVENOUS
  Filled 2018-09-18: qty 5

## 2018-09-18 MED ORDER — TECHNETIUM TC 99M TETROFOSMIN IV KIT
10.0000 | PACK | Freq: Once | INTRAVENOUS | Status: AC | PRN
  Administered 2018-09-18: 10 via INTRAVENOUS

## 2018-09-18 MED ORDER — SODIUM CHLORIDE 0.9% FLUSH: 3.0000 mL | INTRAVENOUS | Status: DC | PRN

## 2018-09-18 MED ORDER — SODIUM CHLORIDE 0.9 % IV SOLN: 250.0000 mL | INTRAVENOUS | Status: DC | PRN

## 2018-09-18 MED ORDER — REGADENOSON 0.4 MG/5ML IV SOLN
INTRAVENOUS | Status: AC
  Filled 2018-09-18: qty 5

## 2018-09-18 MED ORDER — SODIUM CHLORIDE 0.9% FLUSH: 3.0000 mL | Freq: Two times a day (BID) | INTRAVENOUS | Status: DC

## 2018-09-18 MED ORDER — TECHNETIUM TC 99M TETROFOSMIN IV KIT
30.0000 | PACK | Freq: Once | INTRAVENOUS | Status: AC | PRN
  Administered 2018-09-18: 30 via INTRAVENOUS

## 2018-09-18 MED ORDER — ASPIRIN EC 81 MG PO TBEC
81.0000 mg | DELAYED_RELEASE_TABLET | Freq: Every day | ORAL | Status: DC
  Administered 2018-09-20: 81 mg via ORAL
  Filled 2018-09-18 (×2): qty 1

## 2018-09-18 MED ORDER — ASPIRIN 81 MG PO CHEW
81.0000 mg | CHEWABLE_TABLET | ORAL | Status: AC
  Administered 2018-09-19: 05:00:00 81 mg via ORAL
  Filled 2018-09-18: qty 1

## 2018-09-18 MED ORDER — SODIUM CHLORIDE 0.9 % WEIGHT BASED INFUSION
3.0000 mL/kg/h | INTRAVENOUS | Status: DC
  Administered 2018-09-19: 3 mL/kg/h via INTRAVENOUS

## 2018-09-18 MED ORDER — SODIUM CHLORIDE 0.9 % WEIGHT BASED INFUSION
1.0000 mL/kg/h | INTRAVENOUS | Status: DC
  Administered 2018-09-19: 1 mL/kg/h via INTRAVENOUS

## 2018-09-18 NOTE — Progress Notes (Signed)
ANTICOAGULATION CONSULT NOTE - Follow Up Consult  Pharmacy Consult for Heparin Indication: chest pain/ACS  Allergies  Allergen Reactions  . Bee Venom Swelling    Swelling at site of sting  . Erythromycin Diarrhea    Patient Measurements: Height: 5\' 3"  (160 cm) Weight: 118 lb 8 oz (53.8 kg) IBW/kg (Calculated) : 56.9 Heparin Dosing Weight: 53.8kg  Vital Signs: Temp: 98.1 F (36.7 C) (04/27 0804) Temp Source: Oral (04/27 0804) BP: 141/81 (04/27 0804) Pulse Rate: 63 (04/27 0804)  Labs: Recent Labs    09/16/18 2357 09/17/18 0227 09/17/18 0531 09/17/18 0721 09/17/18 1124 09/17/18 1832 09/18/18 0846  HGB 13.3  --   --   --   --   --  13.7  HCT 40.4  --   --   --   --   --  41.5  PLT 238  --   --   --   --   --  242  HEPARINUNFRC  --   --   --   --  0.98* 0.56 0.39  CREATININE 1.15  --   --   --   --   --   --   TROPONINI 0.04* 0.04* 0.03* 0.04*  --   --   --     Estimated Creatinine Clearance: 55.9 mL/min (by C-G formula based on SCr of 1.15 mg/dL).   Assessment: Anticoag: Hep for r/o NSTEMI. HL 0.39 remains in goal range. Hgb 13.7 and plts 242 both stable.  Goal of Therapy:  Heparin level 0.3-0.7 units/ml Monitor platelets by anticoagulation protocol: Yes   Plan:  heparin 750/hr Daily HL and CBC Stress test Monday  Bricelyn Freestone S. Merilynn Finland, PharmD, BCPS Clinical Staff Pharmacist Misty Stanley Stillinger 09/18/2018,10:30 AM

## 2018-09-18 NOTE — Progress Notes (Signed)
PROGRESS NOTE    Gary [name]  OFB:[id number] DOB: [birth year] DOA: 09/16/2018 PCP: System, Pcp Not In   Brief Narrative: 53 year old male with history of anxiety disorder, depression, bipolar, presented to the ER with 12 hours of shortness of breath.  Patient reported getting chest pain normally along shortness of breath with his anxiety however this time he was just short of breath that is different from his previous anxiety attacks, no fever or cough or lower extremity edema.  In the ER troponin was 0.04, patient was placed in heparin drip, seen by cardiology and was admitted as belos:  Subjective: Seen/examined Was waiting for stress test. Denies chest pain  Assessment & Plan:   Shortness of breath with positive troponin possible non-ST elevation MI, however troponin flat, with no chest pain: no chest pain. Cont aspirin, statin, metoprolol. Treated w heparin on admission. this am s/p stress test reported abnormal  "There was no ST segment deviation noted during stress No T wave inversion was noted during stress.Nuclear stress EF: 57%. Mild septal wall hypokinesis. Defect 1: There is a medium defect present in the basal inferoseptal and mid inferoseptal location. Findings consistent with prior myocardial infarction with peri-infarct ischemia.This is an intermediate risk study." Discussed w cardio team, plan is for cardiac cath in am.  Anxiety depression/bipolar :stable mood stable, cont multiple home meds.  DVT prophylaxis: SCD Code Status: full Family Communication: discussed POC w patient and verbalized understanding Disposition Plan: patient will need at least 2 midnight to manage his conditions as above for further testing as his stress test came back abnormal and is being planned for cardiac cath, so change to inpatient status.  Consultants:  Cardiology  Procedures: Stress test  Antimicrobials: Anti-infectives (From admission, onward)   None       Objective: Vitals:   09/18/18 1127 09/18/18 1129 09/18/18 1132 09/18/18 1245  BP: (!) 88/57 (!) 86/53 (!) 113/57 106/63  Pulse:    (!) 58  Resp:    18  Temp:    98 F (36.7 C)  TempSrc:    Oral  SpO2:    100%  Weight:      Height:        Intake/Output Summary (Last 24 hours) at 09/18/2018 1604 Last data filed at 09/18/2018 1300 Gross per 24 hour  Intake 539.77 ml  Output 625 ml  Net -85.23 ml   Filed Weights   09/16/18 2232 09/17/18 0309 09/18/18 0606  Weight: 83.5 kg 54.4 kg 53.8 kg   Weight change: -29.7 kg  Body mass index is 20.99 kg/m.  Intake/Output from previous day: 04/26 0701 - 04/27 0700 In: 678.4 [P.O.:480; I.V.:198.4] Out: 625 [Urine:625] Intake/Output this shift: Total I/O In: 89.8 [I.V.:89.8] Out: -   Examination:  General exam: Appears calm and comfortable,Not in distress. HEENT:PERRL,Oral mucosa moist, Ear/Nose normal on gross exam Respiratory system: Bilateral equal air entry,no wheezes or crackles  Cardiovascular system: S1 & S2 heard,No JVD, murmurs. Gastrointestinal system: Abdomen is  soft, non tender, non distended, BS +  Nervous System:Alert and oriented. No focal neurological deficits/moving extremities, sensation intact. Extremities: No edema, no clubbing, distal peripheral pulses palpable. Skin: No rashes, lesions, no icterus MSK: Normal muscle bulk,tone ,power  Medications:  Scheduled Meds: . aspirin EC  325 mg Oral Daily  . atorvastatin  80 mg Oral q1800  . cholecalciferol  1,000 Units Oral Daily  . dicyclomine  10 mg Oral Daily  . FLUoxetine  40 mg Oral Daily  . gabapentin  400 mg Oral BID  . metoprolol tartrate  12.5 mg Oral BID  . OLANZapine  5 mg Oral QHS  . tamsulosin  0.4 mg Oral Daily   Continuous Infusions: . heparin 750 Units/hr (09/18/18 0200)    Data Reviewed: I have personally reviewed following labs and imaging studies  CBC: Recent Labs  Lab 09/16/18 2357 09/18/18 0846  WBC 4.4 4.3  NEUTROABS 2.6  --   HGB 13.3 13.7  HCT  40.4 41.5  MCV 93.3 94.1  PLT 238 242   Basic Metabolic Panel: Recent Labs  Lab 09/16/18 2357  NA 135  K 4.1  CL 101  CO2 23  GLUCOSE 105*  BUN 13  CREATININE 1.15  CALCIUM 9.4   GFR: Estimated Creatinine Clearance: 55.9 mL/min (by C-G formula based on SCr of 1.15 mg/dL). Liver Function Tests: Recent Labs  Lab 09/16/18 2357  AST 23  ALT 16  ALKPHOS 92  BILITOT 0.4  PROT 6.8  ALBUMIN 3.8   No results for input(s): LIPASE, AMYLASE in the last 168 hours. No results for input(s): AMMONIA in the last 168 hours. Coagulation Profile: No results for input(s): INR, PROTIME in the last 168 hours. Cardiac Enzymes: Recent Labs  Lab 09/16/18 2357 09/17/18 0227 09/17/18 0531 09/17/18 0721  TROPONINI 0.04* 0.04* 0.03* 0.04*   BNP (last 3 results) No results for input(s): PROBNP in the last 8760 hours. HbA1C: No results for input(s): HGBA1C in the last 72 hours. CBG: No results for input(s): GLUCAP in the last 168 hours. Lipid Profile: No results for input(s): CHOL, HDL, LDLCALC, TRIG, CHOLHDL, LDLDIRECT in the last 72 hours. Thyroid Function Tests: No results for input(s): TSH, T4TOTAL, FREET4, T3FREE, THYROIDAB in the last 72 hours. Anemia Panel: No results for input(s): VITAMINB12, FOLATE, FERRITIN, TIBC, IRON, RETICCTPCT in the last 72 hours. Sepsis Labs: No results for input(s): PROCALCITON, LATICACIDVEN in the last 168 hours.  No results found for this or any previous visit (from the past 240 hour(s)).    Radiology Studies: Dg Chest 2 View  Result Date: 09/16/2018 CLINICAL DATA:  Shortness of breath EXAM: CHEST - 2 VIEW COMPARISON:  08/03/2018 FINDINGS: The heart size and mediastinal contours are within normal limits. Both lungs are clear. The visualized skeletal structures are unremarkable. IMPRESSION: No active cardiopulmonary disease. Electronically Signed   By: Jasmine PangKim  Fujinaga M.D.   On: 09/16/2018 23:41   Nm Myocar Multi W/spect W/wall Motion / Ef  Result  Date: 09/18/2018  There was no ST segment deviation noted during stress.  No T wave inversion was noted during stress.  Nuclear stress EF: 57%. Mild septal wall hypokinesis.  Defect 1: There is a medium defect present in the basal inferoseptal and mid inferoseptal location.  Findings consistent with prior myocardial infarction with peri-infarct ischemia.  This is an intermediate risk study.  Donato SchultzMark Skains, MD   Dg Hip Unilat With Pelvis 2-3 Views Right  Result Date: 09/16/2018 CLINICAL DATA:  Hip pain EXAM: DG HIP (WITH OR WITHOUT PELVIS) 2-3V RIGHT COMPARISON:  04/18/2018 FINDINGS: SI joints are non widened. Pubic symphysis and rami are intact. Status post right hip replacement with normal alignment. No fracture seen IMPRESSION: Status post right hip replacement without acute osseous abnormality Electronically Signed   By: Jasmine PangKim  Fujinaga M.D.   On: 09/16/2018 23:42      LOS: 0 days   Time spent: More than 50% of that time was spent in counseling and/or coordination of care.  Lanae Boastamesh Davari Lopes, MD [facility]  09/18/2018, 4:04 PM

## 2018-09-18 NOTE — Progress Notes (Signed)
Spoke with [employer], PA. Pt is low risk, ok to proceed with stress test.

## 2018-09-18 NOTE — H&P (View-Only) (Signed)
Myoview scan result showed inferior infarct and peri-infarct ischemia, deemed intermediate risk. The patient is counseled about further diagnostic/treatment options. I have recommended cardiac catheterization and possible PCI. The patient understands and is agreeable to proceeding. He has just finished lunch at 3 pm so we will schedule his cardiac catheterization for tomorrow. I have reviewed the risks, indications, and alternatives to cardiac catheterization, possible angioplasty, and stenting with the patient. Risks include but are not limited to bleeding, infection, vascular injury, stroke, myocardial infection, arrhythmia, kidney injury, radiation-related injury in the case of prolonged fluoroscopy use, emergency cardiac surgery, and death. The patient understands the risks of serious complication is 1-2 in 1000 with diagnostic cardiac cath and 1-2% or less with angioplasty/stenting.   Tonny Bollman 09/18/2018 4:11 PM

## 2018-09-18 NOTE — Progress Notes (Addendum)
Progress Note  Patient Name: Gary MariscalKevin G Miyoshi Date of Encounter: 09/18/2018  Primary Cardiologist: New  Subjective   No chest pain or shortness of breath.  We discussed his presenting symptoms and he primarily has experienced leg weakness/tremulousness and shortness of breath with activity.  Inpatient Medications    Scheduled Meds: . aspirin EC  325 mg Oral Daily  . atorvastatin  80 mg Oral q1800  . cholecalciferol  1,000 Units Oral Daily  . dicyclomine  10 mg Oral Daily  . FLUoxetine  40 mg Oral Daily  . gabapentin  400 mg Oral BID  . metoprolol tartrate  12.5 mg Oral BID  . OLANZapine  5 mg Oral QHS  . tamsulosin  0.4 mg Oral Daily   Continuous Infusions: . heparin 750 Units/hr (09/18/18 0200)   PRN Meds: acetaminophen, LORazepam, ondansetron (ZOFRAN) IV, sodium chloride flush   Vital Signs    Vitals:   09/17/18 2057 09/18/18 0155 09/18/18 0606 09/18/18 0804  BP:  111/84 124/83 (!) 141/81  Pulse: (!) 59 (!) 57 (!) 58 63  Resp:  18 16 15   Temp:  97.8 F (36.6 C) (!) 97.5 F (36.4 C) 98.1 F (36.7 C)  TempSrc:  Oral Oral Oral  SpO2:  100% 100% 99%  Weight:   53.8 kg   Height:        Intake/Output Summary (Last 24 hours) at 09/18/2018 0926 Last data filed at 09/18/2018 0800 Gross per 24 hour  Intake 649.24 ml  Output 625 ml  Net 24.24 ml   Last 3 Weights 09/18/2018 09/17/2018 09/16/2018  Weight (lbs) 118 lb 8 oz 119 lb 14.4 oz 184 lb  Weight (kg) 53.751 kg 54.386 kg 83.462 kg      Telemetry    Normal sinus rhythm- Personally Reviewed  ECG    Normal sinus rhythm, within normal limits- Personally Reviewed  Physical Exam  Alert, oriented male resting comfortably in bed GEN: No acute distress.   Neck: No JVD Cardiac: RRR, no murmurs, rubs, or gallops.  Respiratory: Clear to auscultation bilaterally. GI: Soft, nontender, non-distended  MS: No edema; No deformity. Neuro:  Nonfocal  Psych: Normal affect   Labs    Chemistry Recent Labs  Lab 09/16/18  2357  NA 135  K 4.1  CL 101  CO2 23  GLUCOSE 105*  BUN 13  CREATININE 1.15  CALCIUM 9.4  PROT 6.8  ALBUMIN 3.8  AST 23  ALT 16  ALKPHOS 92  BILITOT 0.4  GFRNONAA >60  GFRAA >60  ANIONGAP 11     Hematology Recent Labs  Lab 09/16/18 2357 09/18/18 0846  WBC 4.4 4.3  RBC 4.33 4.41  HGB 13.3 13.7  HCT 40.4 41.5  MCV 93.3 94.1  MCH 30.7 31.1  MCHC 32.9 33.0  RDW 13.5 13.5  PLT 238 242    Cardiac Enzymes Recent Labs  Lab 09/16/18 2357 09/17/18 0227 09/17/18 0531 09/17/18 0721  TROPONINI 0.04* 0.04* 0.03* 0.04*   No results for input(s): TROPIPOC in the last 168 hours.   BNP Recent Labs  Lab 09/17/18 0227  BNP 21.6     DDimer  Recent Labs  Lab 09/16/18 2357  DDIMER 0.40     Radiology    Dg Chest 2 View  Result Date: 09/16/2018 CLINICAL DATA:  Shortness of breath EXAM: CHEST - 2 VIEW COMPARISON:  08/03/2018 FINDINGS: The heart size and mediastinal contours are within normal limits. Both lungs are clear. The visualized skeletal structures are unremarkable. IMPRESSION: No  active cardiopulmonary disease. Electronically Signed   By: Jasmine Pang M.D.   On: 09/16/2018 23:41   Dg Hip Unilat With Pelvis 2-3 Views Right  Result Date: 09/16/2018 CLINICAL DATA:  Hip pain EXAM: DG HIP (WITH OR WITHOUT PELVIS) 2-3V RIGHT COMPARISON:  04/18/2018 FINDINGS: SI joints are non widened. Pubic symphysis and rami are intact. Status post right hip replacement with normal alignment. No fracture seen IMPRESSION: Status post right hip replacement without acute osseous abnormality Electronically Signed   By: Jasmine Pang M.D.   On: 09/16/2018 23:42    Cardiac Studies   Pending stress test  Patient Profile     53 year old man with a past medical history of anxiety disorder and depression presents to the emergency room with new onset shortness of breath when walking.   Assessment & Plan    Shortness of breath with minimal troponin elevation - Troponin flat trend 0.04  x 3. EKG without acute ischemic changes. Pending tress test today. - On ASA (change to ASAmg daily, already got higher dose today), statin and low dose BB.  For questions or updates, please contact [facility] Please consult [email or web address] for contact info under     SignedManson Passey, PA  09/18/2018, 9:26 AM    Patient seen, examined. Available data reviewed. Agree with findings, assessment, and plan as outlined by Chelsea Aus, PA.  Physical exam findings documented above reflect the personal findings of my examination.  The patient's symptoms really do not sound typical of acute coronary syndrome.  His troponin elevation is minimal, just barely above the upper limit of normal and his EKG shows no acute changes.  We will await his stress test today.  As long as his stress test shows no significant areas of ischemia, I am inclined to continue with medical treatment as outlined with aspirin, a statin, and a low-dose beta-blocker.  He should be ready for discharge home today if his stress test is negative.   Tonny Bollman, M.D. 09/18/2018 1:07 PM

## 2018-09-18 NOTE — Progress Notes (Signed)
Patient presented for Lexiscan. Tolerated procedure well. Pending final stress imaging result.  

## 2018-09-18 NOTE — Progress Notes (Signed)
Myoview scan result showed inferior infarct and peri-infarct ischemia, deemed intermediate risk. The patient is counseled about further diagnostic/treatment options. I have recommended cardiac catheterization and possible PCI. The patient understands and is agreeable to proceeding. He has just finished lunch at 3 pm so we will schedule his cardiac catheterization for tomorrow. I have reviewed the risks, indications, and alternatives to cardiac catheterization, possible angioplasty, and stenting with the patient. Risks include but are not limited to bleeding, infection, vascular injury, stroke, myocardial infection, arrhythmia, kidney injury, radiation-related injury in the case of prolonged fluoroscopy use, emergency cardiac surgery, and death. The patient understands the risks of serious complication is 1-2 in 1000 with diagnostic cardiac cath and 1-2% or less with angioplasty/stenting.   Jamey Harman 09/18/2018 4:11 PM  

## 2018-09-19 ENCOUNTER — Encounter (HOSPITAL_COMMUNITY)
Admission: EM | Disposition: A | Discharge status: Home / Self Care | Payer: Self-pay | Source: Home / Self Care | Attending: Internal Medicine

## 2018-09-19 DIAGNOSIS — R931 Abnormal findings on diagnostic imaging of heart and coronary circulation: Secondary | ICD-10-CM

## 2018-09-19 DIAGNOSIS — R9439 Abnormal result of other cardiovascular function study: Secondary | ICD-10-CM

## 2018-09-19 HISTORY — PX: LEFT HEART CATH AND CORONARY ANGIOGRAPHY: CATH118249

## 2018-09-19 LAB — LIPID PANEL
Cholesterol: 172 mg/dL (ref 0–200)
HDL: 38 mg/dL — ABNORMAL LOW (ref >40)
LDL Cholesterol: 107 mg/dL — ABNORMAL HIGH (ref 0–99)
Total CHOL/HDL Ratio: 4.5 RATIO
Triglycerides: 134 mg/dL (ref <150)
VLDL: 27 mg/dL (ref 0–40)

## 2018-09-19 LAB — CBC
HCT: 40.4 % (ref 39.0–52.0)
Hemoglobin: 13.4 g/dL (ref 13.0–17.0)
MCH: 31.2 pg (ref 26.0–34.0)
MCHC: 33.2 g/dL (ref 30.0–36.0)
MCV: 94 fL (ref 80.0–100.0)
Platelets: 225 10*3/uL (ref 150–400)
RBC: 4.3 MIL/uL (ref 4.22–5.81)
RDW: 13.6 % (ref 11.5–15.5)
WBC: 4.2 10*3/uL (ref 4.0–10.5)
nRBC: 0 % (ref 0.0–0.2)

## 2018-09-19 LAB — BASIC METABOLIC PANEL
Anion gap: 6 (ref 5–15)
BUN: 17 mg/dL (ref 6–20)
CO2: 26 mmol/L (ref 22–32)
Calcium: 8.8 mg/dL — ABNORMAL LOW (ref 8.9–10.3)
Chloride: 106 mmol/L (ref 98–111)
Creatinine, Ser: 1.28 mg/dL — ABNORMAL HIGH (ref 0.61–1.24)
GFR calc Af Amer: >60 mL/min (ref >60)
GFR calc non Af Amer: >60 mL/min (ref >60)
Glucose, Bld: 98 mg/dL (ref 70–99)
Potassium: 4.1 mmol/L (ref 3.5–5.1)
Sodium: 138 mmol/L (ref 135–145)

## 2018-09-19 LAB — HEPARIN LEVEL (UNFRACTIONATED): Heparin Unfractionated: 0.37 IU/mL (ref 0.30–0.70)

## 2018-09-19 SURGERY — LEFT HEART CATH AND CORONARY ANGIOGRAPHY
Anesthesia: LOCAL

## 2018-09-19 MED ORDER — MIDAZOLAM HCL 2 MG/2ML IJ SOLN
INTRAMUSCULAR | Status: AC
  Filled 2018-09-19: qty 2

## 2018-09-19 MED ORDER — FENTANYL CITRATE (PF) 100 MCG/2ML IJ SOLN
INTRAMUSCULAR | Status: DC | PRN
  Administered 2018-09-19: 25 ug via INTRAVENOUS

## 2018-09-19 MED ORDER — HYDRALAZINE HCL 20 MG/ML IJ SOLN: 10.0000 mg | INTRAMUSCULAR | Status: AC | PRN

## 2018-09-19 MED ORDER — SODIUM CHLORIDE 0.9 % IV SOLN: 250.0000 mL | INTRAVENOUS | Status: DC | PRN

## 2018-09-19 MED ORDER — SODIUM CHLORIDE 0.9% FLUSH: 3.0000 mL | INTRAVENOUS | Status: DC | PRN

## 2018-09-19 MED ORDER — SODIUM CHLORIDE 0.9 % IV SOLN
INTRAVENOUS | Status: DC
  Administered 2018-09-19 – 2018-09-20 (×2): via INTRAVENOUS

## 2018-09-19 MED ORDER — ACETAMINOPHEN 325 MG PO TABS: 650.0000 mg | ORAL_TABLET | ORAL | Status: DC | PRN

## 2018-09-19 MED ORDER — SODIUM CHLORIDE 0.9 % IV SOLN: INTRAVENOUS | Status: AC

## 2018-09-19 MED ORDER — VERAPAMIL HCL 2.5 MG/ML IV SOLN
INTRAVENOUS | Status: AC
  Filled 2018-09-19: qty 2

## 2018-09-19 MED ORDER — ONDANSETRON HCL 4 MG/2ML IJ SOLN: 4.0000 mg | Freq: Four times a day (QID) | INTRAMUSCULAR | Status: DC | PRN

## 2018-09-19 MED ORDER — LABETALOL HCL 5 MG/ML IV SOLN: 10.0000 mg | INTRAVENOUS | Status: AC | PRN

## 2018-09-19 MED ORDER — LIDOCAINE HCL (PF) 1 % IJ SOLN
INTRAMUSCULAR | Status: AC
  Filled 2018-09-19: qty 30

## 2018-09-19 MED ORDER — FENTANYL CITRATE (PF) 100 MCG/2ML IJ SOLN
INTRAMUSCULAR | Status: AC
  Filled 2018-09-19: qty 2

## 2018-09-19 MED ORDER — VERAPAMIL HCL 2.5 MG/ML IV SOLN
INTRAVENOUS | Status: DC | PRN
  Administered 2018-09-19: 10 mL via INTRA_ARTERIAL

## 2018-09-19 MED ORDER — MIDAZOLAM HCL 2 MG/2ML IJ SOLN
INTRAMUSCULAR | Status: DC | PRN
  Administered 2018-09-19: 2 mg via INTRAVENOUS

## 2018-09-19 MED ORDER — HEPARIN (PORCINE) IN NACL 1000-0.9 UT/500ML-% IV SOLN
INTRAVENOUS | Status: DC | PRN
  Administered 2018-09-19 (×2): 500 mL

## 2018-09-19 MED ORDER — SODIUM CHLORIDE 0.9% FLUSH
3.0000 mL | Freq: Two times a day (BID) | INTRAVENOUS | Status: DC
  Administered 2018-09-19: 3 mL via INTRAVENOUS

## 2018-09-19 MED ORDER — SODIUM CHLORIDE 0.9 % IV BOLUS
500.0000 mL | Freq: Once | INTRAVENOUS | Status: AC
  Administered 2018-09-19: 15:00:00 500 mL via INTRAVENOUS

## 2018-09-19 MED ORDER — HEPARIN SODIUM (PORCINE) 1000 UNIT/ML IJ SOLN
INTRAMUSCULAR | Status: DC | PRN
  Administered 2018-09-19: 3000 [IU] via INTRAVENOUS

## 2018-09-19 MED ORDER — IOHEXOL 350 MG/ML SOLN
INTRAVENOUS | Status: DC | PRN
  Administered 2018-09-19: 30 mL via INTRA_ARTERIAL

## 2018-09-19 MED ORDER — LIDOCAINE HCL (PF) 1 % IJ SOLN
INTRAMUSCULAR | Status: DC | PRN
  Administered 2018-09-19: 2 mL

## 2018-09-19 MED ORDER — HEPARIN (PORCINE) IN NACL 1000-0.9 UT/500ML-% IV SOLN
INTRAVENOUS | Status: AC
  Filled 2018-09-19: qty 1000

## 2018-09-19 SURGICAL SUPPLY — 9 items
CATH 5FR JL3.5 JR4 ANG PIG MP (CATHETERS) ×2 IMPLANT
DEVICE RAD COMP TR BAND LRG (VASCULAR PRODUCTS) ×2 IMPLANT
GLIDESHEATH SLEND SS 6F .021 (SHEATH) ×2 IMPLANT
GUIDEWIRE INQWIRE 1.5J.035X260 (WIRE) ×1 IMPLANT
INQWIRE 1.5J .035X260CM (WIRE) ×2
KIT HEART LEFT (KITS) ×2 IMPLANT
PACK CARDIAC CATHETERIZATION (CUSTOM PROCEDURE TRAY) ×2 IMPLANT
TRANSDUCER W/STOPCOCK (MISCELLANEOUS) ×2 IMPLANT
TUBING CIL FLEX 10 FLL-RA (TUBING) ×2 IMPLANT

## 2018-09-19 NOTE — Progress Notes (Signed)
Writer remove TR band and applied clean dry dressing per protocol no advert bleeding noted with removal of band. Patient sitting up in bed eating his meal denies distress. Heart cath negative for problems per patient report.

## 2018-09-19 NOTE — Progress Notes (Signed)
   Vital Signs MEWS/VS Documentation      09/19/2018 0915 09/19/2018 0922 09/19/2018 0953 09/19/2018 1001   MEWS Score:  1  0  1  2   MEWS Score Color:  Green  Green  Green  Yellow   Pulse:  -  -  (!) 50  (!) 47   BP:  -  -  110/75  93/73   O2 Device:  -  -  -  Room Air   Level of Consciousness:  Responds to Voice  Alert  -  -           Jeanella Flattery 09/19/2018,10:12 AM

## 2018-09-19 NOTE — Progress Notes (Signed)
PROGRESS NOTE    Gary [name]  ZOX:[id number] DOB: 29-Jul-1965 DOA: 09/16/2018 PCP: System, Pcp Not In   Brief Narrative: 53 year old male with history of anxiety disorder, depression, bipolar, presented to the ER with 12 hours of shortness of breath.  Patient reported getting chest pain normally along shortness of breath with his anxiety however this time he was just short of breath that is different from his previous anxiety attacks, no fever or cough or lower extremity edema.  In the ER troponin was 0.04, patient was placed in heparin drip, seen by cardiology and was admitted as belos:  Subjective: Seen/examinedno more chest pain Was dizzy on walking post cath  Assessment & Plan:  Shortness of breath with positive troponin possible non-ST elevation MI, however troponin flat, with no chest pain:treated w heparin gtt-s/p stress test reported abnormal w intermediate risk. Cardiology recommended cardiac cath: Underwent cath 4/28 however cath came back negative for any acute finding. Continue aspirin, Lipitor. Hold off beta-blocker due to bradycardia/hypotension   Hypotension and bradycardia: Pt BP has been 80s at times lower. HR 40-50s. Gave a 500 ml nss, BP coming up. We walked him around, he was symptomatic with dizziness. We will hydrate him overnight wit iv nss. Stop metoprolol. Monitor.  Anxiety depression/bipolar :stable mood stable, cont multiple home meds.  DVT prophylaxis: SCD Code Status: full Family Communication: discussed POC w patient and verbalized understanding Disposition Plan: anticipate d/c in am. Pt is reluctant to go home as he was symptomatic and dizzy on walking, trembled and was shuffling.  Consultants:  Cardiology  Procedures: Nuclear Stress test "There was no ST segment deviation noted during stress No T wave inversion was noted during stress.Nuclear stress EF: 57%. Mild septal wall hypokinesis. Defect 1: There is a medium defect present in the basal  inferoseptal and mid inferoseptal location. Findings consistent with prior myocardial infarction with peri-infarct ischemia.This is an intermediate risk study."  rocedure Cardiac cath 4/28  The left ventricular systolic function is normal.  LV end diastolic pressure is normal. LVEDP 13 mm Hg.  The left ventricular ejection fraction is 55-65% by visual estimate.  There is no aortic valve stenosis.  No angiographically apparent coronary artery disease.   Antimicrobials: Anti-infectives (From admission, onward)   None       Objective: Vitals:   09/19/18 1145 09/19/18 1317 09/19/18 1440 09/19/18 1553  BP: 99/68 (!) 93/56 (!) 98/54 107/66  Pulse: (!) 47 (!) 49  (!) 53  Resp:  18    Temp:  97.7 F (36.5 C)    TempSrc:  Oral    SpO2:  100%    Weight:      Height:        Intake/Output Summary (Last 24 hours) at 09/19/2018 1700 Last data filed at 09/19/2018 0836 Gross per 24 hour  Intake 941.54 ml  Output 850 ml  Net 91.54 ml   Filed Weights   09/17/18 0309 09/18/18 0606 09/19/18 0423  Weight: 54.4 kg 53.8 kg 56.5 kg   Weight change: 2.767 kg  Body mass index is 22.07 kg/m.  Intake/Output from previous day: 04/27 0701 - 04/28 0700 In: 1512.8 [P.O.:1318; I.V.:194.8] Out: 1475 [Urine:1475] Intake/Output this shift: Total I/O In: 261 [P.O.:220; I.V.:41] Out: -   Examination:  General exam: Calm, comfortable, not in acute distress, older for age, average built.  HEENT:Oral mucosa moist, Ear/Nose WNL grossly, dentition normal. Respiratory system: Bilateral equal air entry, no crackles and wheezing, no use of accessory muscle, non tender  on palpation. Cardiovascular system: regular rate and rhythm, S1 & S2 heard, No JVD/murmurs. Gastrointestinal system: Abdomen soft, non-tender, non-distended, BS +. No hepatosplenomegaly palpable. Nervous System:Alert, awake and oriented at baseline. Able to move UE and LE, sensation intact. Extremities: No edema, distal peripheral  pulses palpable.  Skin: No rashes,no icterus. MSK: Normal muscle bulk,tone, power  Medications:  Scheduled Meds: . aspirin EC  81 mg Oral Daily  . atorvastatin  80 mg Oral q1800  . cholecalciferol  1,000 Units Oral Daily  . dicyclomine  10 mg Oral Daily  . FLUoxetine  40 mg Oral Daily  . gabapentin  400 mg Oral BID  . OLANZapine  5 mg Oral QHS  . sodium chloride flush  3 mL Intravenous Q12H  . tamsulosin  0.4 mg Oral Daily   Continuous Infusions: . sodium chloride    . sodium chloride      Data Reviewed: I have personally reviewed following labs and imaging studies  CBC: Recent Labs  Lab 09/16/18 2357 09/18/18 0846 09/19/18 0436  WBC 4.4 4.3 4.2  NEUTROABS 2.6  --   --   HGB 13.3 13.7 13.4  HCT 40.4 41.5 40.4  MCV 93.3 94.1 94.0  PLT 238 242 225   Basic Metabolic Panel: Recent Labs  Lab 09/16/18 2357 09/19/18 0701  NA 135 138  K 4.1 4.1  CL 101 106  CO2 23 26  GLUCOSE 105* 98  BUN 13 17  CREATININE 1.15 1.28*  CALCIUM 9.4 8.8*   GFR: Estimated Creatinine Clearance: 52.7 mL/min (A) (by C-G formula based on SCr of 1.28 mg/dL (H)). Liver Function Tests: Recent Labs  Lab 09/16/18 2357  AST 23  ALT 16  ALKPHOS 92  BILITOT 0.4  PROT 6.8  ALBUMIN 3.8   No results for input(s): LIPASE, AMYLASE in the last 168 hours. No results for input(s): AMMONIA in the last 168 hours. Coagulation Profile: No results for input(s): INR, PROTIME in the last 168 hours. Cardiac Enzymes: Recent Labs  Lab 09/16/18 2357 09/17/18 0227 09/17/18 0531 09/17/18 0721  TROPONINI 0.04* 0.04* 0.03* 0.04*   BNP (last 3 results) No results for input(s): PROBNP in the last 8760 hours. HbA1C: No results for input(s): HGBA1C in the last 72 hours. CBG: No results for input(s): GLUCAP in the last 168 hours. Lipid Profile: Recent Labs    09/19/18 0436  CHOL 172  HDL 38*  LDLCALC 107*  TRIG 134  CHOLHDL 4.5   Thyroid Function Tests: No results for input(s): TSH, T4TOTAL,  FREET4, T3FREE, THYROIDAB in the last 72 hours. Anemia Panel: No results for input(s): VITAMINB12, FOLATE, FERRITIN, TIBC, IRON, RETICCTPCT in the last 72 hours. Sepsis Labs: No results for input(s): PROCALCITON, LATICACIDVEN in the last 168 hours.  No results found for this or any previous visit (from the past 240 hour(s)).    Radiology Studies: Nm Myocar Multi W/spect W/wall Motion / Ef  Result Date: 09/18/2018  There was no ST segment deviation noted during stress.  No T wave inversion was noted during stress.  Nuclear stress EF: 57%. Mild septal wall hypokinesis.  Defect 1: There is a medium defect present in the basal inferoseptal and mid inferoseptal location.  Findings consistent with prior myocardial infarction with peri-infarct ischemia.  This is an intermediate risk study.  Donato SchultzMark Skains, MD      LOS: 1 day   Time spent: More than 50% of that time was spent in counseling and/or coordination of care.  Lanae Boastamesh Caliann Leckrone, MD [facility]  09/19/2018, 5:00 PM

## 2018-09-19 NOTE — Progress Notes (Signed)
Patient sent to cath lab in his bed without difficulty. Heparin Drip placed on hold. CCMD called patient taken off monitor for procedure. No further changes noted.

## 2018-09-19 NOTE — Progress Notes (Signed)
Progress Note  Patient Name: Gary [name] Date of Encounter: 09/19/2018  Primary Cardiologist: [facility] (Dr. Excell Seltzerooper).  Subjective   No significant overnight events.   Inpatient Medications    Scheduled Meds: . aspirin EC  81 mg Oral Daily  . atorvastatin  80 mg Oral q1800  . cholecalciferol  1,000 Units Oral Daily  . dicyclomine  10 mg Oral Daily  . FLUoxetine  40 mg Oral Daily  . gabapentin  400 mg Oral BID  . metoprolol tartrate  12.5 mg Oral BID  . OLANZapine  5 mg Oral QHS  . sodium chloride flush  3 mL Intravenous Q12H  . tamsulosin  0.4 mg Oral Daily   Continuous Infusions: . sodium chloride    . sodium chloride 1 mL/kg/hr (09/19/18 0524)  . heparin 750 Units/hr (09/19/18 0419)   PRN Meds: sodium chloride, acetaminophen, LORazepam, ondansetron (ZOFRAN) IV, sodium chloride flush, sodium chloride flush   Vital Signs    Vitals:   09/18/18 1957 09/19/18 0015 09/19/18 0423 09/19/18 0826  BP: 107/68 112/74 (!) 137/91 138/87  Pulse: (!) 58 (!) 59 (!) 54 (!) 58  Resp: 18 18 18    Temp: 98.4 F (36.9 C) 98.5 F (36.9 C) 97.7 F (36.5 C)   TempSrc: Oral Oral Oral   SpO2: 98% 97% 100%   Weight:   56.5 kg   Height:        Intake/Output Summary (Last 24 hours) at 09/19/2018 0832 Last data filed at 09/19/2018 0751 Gross per 24 hour  Intake 1560.35 ml  Output 1475 ml  Net 85.35 ml   Filed Weights   09/17/18 0309 09/18/18 0606 09/19/18 0423  Weight: 54.4 kg 53.8 kg 56.5 kg    Telemetry    Sinus rhythm without arrhythmia- Personally Reviewed  ECG    No new ECG tracing today. - Personally Reviewed  Physical Exam   Physical Exam per MD:  GEN: 53 year old male resting comfortably. Alert and in no acute distress.   Neck: Supple. No JVD. Cardiac: RRR. No murmurs, gallops, or rubs.  Respiratory: Clear to auscultation bilaterally. No wheezes, rhonchi, or rales. GI: Abdomen soft, non-distended, and non-tender. Bowel sounds present. Extremities: No lower  extremity edema.  TR band in place over the right radial artery site with no ecchymosis or hematoma Skin: Warm and dry. Neuro:  No focal deficits. Psych: Normal affect. Responds appropriately.  Labs    Chemistry Recent Labs  Lab 09/16/18 2357 09/19/18 0701  NA 135 138  K 4.1 4.1  CL 101 106  CO2 23 26  GLUCOSE 105* 98  BUN 13 17  CREATININE 1.15 1.28*  CALCIUM 9.4 8.8*  PROT 6.8  --   ALBUMIN 3.8  --   AST 23  --   ALT 16  --   ALKPHOS 92  --   BILITOT 0.4  --   GFRNONAA >60 >60  GFRAA >60 >60  ANIONGAP 11 6     Hematology Recent Labs  Lab 09/16/18 2357 09/18/18 0846 09/19/18 0436  WBC 4.4 4.3 4.2  RBC 4.33 4.41 4.30  HGB 13.3 13.7 13.4  HCT 40.4 41.5 40.4  MCV 93.3 94.1 94.0  MCH 30.7 31.1 31.2  MCHC 32.9 33.0 33.2  RDW 13.5 13.5 13.6  PLT 238 242 225    Cardiac Enzymes Recent Labs  Lab 09/16/18 2357 09/17/18 0227 09/17/18 0531 09/17/18 0721  TROPONINI 0.04* 0.04* 0.03* 0.04*   No results for input(s): TROPIPOC in the  last 168 hours.   BNP Recent Labs  Lab 09/17/18 0227  BNP 21.6     DDimer  Recent Labs  Lab 09/16/18 2357  DDIMER 0.40     Radiology    Nm Myocar Multi W/spect W/wall Motion / Ef  Result Date: 09/18/2018  There was no ST segment deviation noted during stress.  No T wave inversion was noted during stress.  Nuclear stress EF: 57%. Mild septal wall hypokinesis.  Defect 1: There is a medium defect present in the basal inferoseptal and mid inferoseptal location.  Findings consistent with prior myocardial infarction with peri-infarct ischemia.  This is an intermediate risk study.  Donato Schultz, MD    Cardiac Studies   Cardiac catheterization from this morning is personally reviewed with angiographically normal coronary arteries.  Patient Profile   Mr. Gary [name] is a 53 y.o. male with a past medical history of anxiety and depression who presented to the ED with new onset shortness of breath while walking.  Assessment &  Plan    Dyspnea on Exertion - Troponin minimally elevated and flat at 0.04 >> 0.04 >> 0.03 >> 0.04. Not consistent with ACS. - BNP normal. - D-dimer negative. - Myoview yesterday was intermediate risk with a medium defect present in the basal inferoseptal and mid-inferoseptal location consistent with prior myocardial infarction with peri-infarct ischemia. - Plan is for left heart catheterization today.   For questions or updates, please contact [facility] Please consult [email or web address] for contact info under Cardiology/STEMI.      SignedCorrin Parker, PA-C  09/19/2018, 8:32 AM   Pager: [phone]  Patient seen, examined. Available data reviewed. Agree with findings, assessment, and plan as outlined by  Marjie Skiff, PA-C.  The findings documented above are reflective of my personal exam findings of this patient today.  The patient underwent cardiac catheterization demonstrating angiographically normal coronary arteries.  It appears he had a false positive nuclear scan.  With minimal troponin elevation, coronary angiogram findings documented above, he did not have true ACS.  He should be treated as a primary prevention patient.  He is stable for discharge from a cardiac perspective.  Cardiac Cath findings: Conclusion     The left ventricular systolic function is normal.  LV end diastolic pressure is normal. LVEDP 13 mm Hg.  The left ventricular ejection fraction is 55-65% by visual estimate.  There is no aortic valve stenosis.  No angiographically apparent coronary artery disease.   Continue preventive therapy.    Please call if any questions. Outpatient FU with PCP.  Tonny Bollman, M.D. 09/19/2018 11:16 AM

## 2018-09-19 NOTE — Progress Notes (Signed)
Patient back from cath lab, patients hand in side that TR band was placed is purple and starting to fell numb. Radial pulse felt +2. Cath lab RN explained that she tried to release air and patient started to bleed. Will continue to monitor.

## 2018-09-19 NOTE — Interval H&P Note (Signed)
Cath Lab Visit (complete for each Cath Lab visit)  Clinical Evaluation Leading to the Procedure:   ACS: Yes.    Non-ACS:    Anginal Classification: CCS IV  Anti-ischemic medical therapy: Minimal Therapy (1 class of medications)  Non-Invasive Test Results: Intermediate-risk stress test findings: cardiac mortality 1-3%/year  Prior CABG: No previous CABG      History and Physical Interval Note:  09/19/2018 8:54 AM  Gary [name]  has presented today for surgery, with the diagnosis of abnormal stress.  The various methods of treatment have been discussed with the patient and family. After consideration of risks, benefits and other options for treatment, the patient has consented to  Procedure(s): LEFT HEART CATH AND CORONARY ANGIOGRAPHY (N/A) as a surgical intervention.  The patient's history has been reviewed, patient examined, no change in status, stable for surgery.  I have reviewed the patient's chart and labs.  Questions were answered to the patient's satisfaction.     Lance Muss

## 2018-09-20 ENCOUNTER — Encounter (HOSPITAL_COMMUNITY): Payer: Self-pay | Admitting: Interventional Cardiology

## 2018-09-20 LAB — CBC
HCT: 38.6 % — ABNORMAL LOW (ref 39.0–52.0)
Hemoglobin: 12.9 g/dL — ABNORMAL LOW (ref 13.0–17.0)
MCH: 31.3 pg (ref 26.0–34.0)
MCHC: 33.4 g/dL (ref 30.0–36.0)
MCV: 93.7 fL (ref 80.0–100.0)
Platelets: 199 10*3/uL (ref 150–400)
RBC: 4.12 MIL/uL — ABNORMAL LOW (ref 4.22–5.81)
RDW: 13.5 % (ref 11.5–15.5)
WBC: 4.6 10*3/uL (ref 4.0–10.5)
nRBC: 0 % (ref 0.0–0.2)

## 2018-09-20 LAB — BASIC METABOLIC PANEL
Anion gap: 9 (ref 5–15)
BUN: 12 mg/dL (ref 6–20)
CO2: 26 mmol/L (ref 22–32)
Calcium: 8.9 mg/dL (ref 8.9–10.3)
Chloride: 105 mmol/L (ref 98–111)
Creatinine, Ser: 1.05 mg/dL (ref 0.61–1.24)
GFR calc Af Amer: >60 mL/min (ref >60)
GFR calc non Af Amer: >60 mL/min (ref >60)
Glucose, Bld: 153 mg/dL — ABNORMAL HIGH (ref 70–99)
Potassium: 4 mmol/L (ref 3.5–5.1)
Sodium: 140 mmol/L (ref 135–145)

## 2018-09-20 MED ORDER — ATORVASTATIN CALCIUM 20 MG PO TABS: 20.0000 mg | ORAL_TABLET | Freq: Every day | ORAL | 0 refills | Status: DC

## 2018-09-20 MED FILL — ATORVASTATIN CALCIUM 20 MG: 20 | 30 days supply | Qty: 30 | Fill #0

## 2018-09-20 NOTE — Discharge Summary (Signed)
Discharge Summary  [name] PJA:[id number] DOB: [birth year]  PCP: System, Pcp Not In  Admit date: 09/16/2018 Discharge date: 09/20/2018  Time spent:  Recommendations for Outpatient Follow-up:  1. F/u with PCP within a week  for hospital discharge follow up, repeat cbc/bmp at follow up 2. F/u with [facility]   Discharge Diagnoses:  Active Hospital Problems   Diagnosis Date Noted  . NSTEMI (non-ST elevated myocardial infarction) (HCC) 09/17/2018  . Abnormal nuclear stress test   . SOB (shortness of breath) 09/17/2018  . GERD (gastroesophageal reflux disease)   . GAD (generalized anxiety disorder) 11/06/2015    Resolved Hospital Problems  No resolved problems to display.    Discharge Condition: stable  Diet recommendation: heart healthy  Filed Weights   09/18/18 0606 09/19/18 0423 09/20/18 0518  Weight: 53.8 kg 56.5 kg 58 kg    History of present illness: (per admitting MD Dr Onalee Hua) PCP: System, Pcp Not In  Patient coming from: Home  Chief Complaint: Shortness of breath  HPI: Gary [name] is a 53 y.o. male with medical history significant of anxiety and depression and bipolar disorder comes in with less than 12 hours of shortness of breath.  Patient reports he normally gets chest pain and shortness of breath with his anxiety however today he is just short of breath so has not what he usually feels when he has anxiety attacks.  He denies any fevers or cough.  He denies any lower extremity edema or swelling.  He denies any recent illnesses.  He denies any chest pain.  Denies any nausea vomiting or diarrhea.  Cardiology was consulted and is advising heart catheterization for troponin of 0.04.  Patient is being referred for admission for heart catheterization per cardiology recommendations.  Hospital Course:  Principal Problem:   NSTEMI (non-ST elevated myocardial infarction) (HCC) Active Problems:   GAD (generalized anxiety disorder)   SOB (shortness of breath)  GERD (gastroesophageal reflux disease)   Abnormal nuclear stress test  Sob with mild elevation of troponin -cardiology consulted cath is clean -troponin mild and flat -ddimer unremarkable -cxr clear -ldl 107 -he is started on low dose stain, he is cleared to discharge home by cardiology  sinus bradycardia -heart rate range from low 50's to 77 -bp stable -avoid betablocker or ccb -f/u with pcp  Mild aki/dehydration -cr peaked at 1.28, improved after hydration, cr at discharge is 1.05, baseline is 0.96   Anxiety depression/bipolar:stablemood stable, cont multiple home meds. He reports follow up with [facility] regularly   Procedures:  Cardiac cath on 4/28  Consultations:  cardiology  Discharge Exam: BP 130/70 (BP Location: Right Arm)   Pulse 77   Temp 98 F (36.7 C) (Oral)   Resp 20   Ht 5\' 3"  (1.6 m)   Wt 58 kg Comment: C scale  SpO2 100%   BMI 22.64 kg/m   General: NAD Cardiovascular: RRR Respiratory: CTABL   Discharge Instructions You were cared for by a hospitalist during your hospital stay. If you have any questions about your discharge medications or the care you received while you were in the hospital after you are discharged, you can call the unit and asked to speak with the hospitalist on call if the hospitalist that took care of you is not available. Once you are discharged, your primary care physician will handle any further medical issues. Please note that NO REFILLS for any discharge medications will be authorized once you are discharged, as it is imperative that you  return to your primary care physician (or establish a relationship with a primary care physician if you do not have one) for your aftercare needs so that they can reassess your need for medications and monitor your lab values.  Discharge Instructions    Diet - low sodium heart healthy   Complete by:  As directed    Increase activity slowly   Complete by:  As directed      Allergies as  of 09/20/2018      Reactions   Bee Venom Swelling   Swelling at site of sting   Erythromycin Diarrhea      Medication List    STOP taking these medications   LORazepam 1 MG tablet Commonly known as:  Ativan     TAKE these medications   atorvastatin 20 MG tablet Commonly known as:  Lipitor Take 1 tablet (20 mg total) by mouth daily at 6 PM.   cholecalciferol 25 MCG (1000 UT) tablet Commonly known as:  VITAMIN D3 Take 1,000 Units by mouth every morning.   dicyclomine 10 MG capsule Commonly known as:  BENTYL Take 10 mg by mouth every morning.   FLUoxetine 40 MG capsule Commonly known as:  PROZAC Take 40 mg by mouth every morning.   gabapentin 400 MG capsule Commonly known as:  NEURONTIN Take 400 mg by mouth 2 (two) times daily. Morning and evening   OLANZapine 5 MG tablet Commonly known as:  ZYPREXA Take 5 mg by mouth at bedtime.   ranitidine 150 MG capsule Commonly known as:  ZANTAC Take 150 mg by mouth 2 (two) times daily. Morning and evening   tamsulosin 0.4 MG Caps capsule Commonly known as:  FLOMAX Take 0.4 mg by mouth every morning.      Allergies  Allergen Reactions  . Bee Venom Swelling    Swelling at site of sting  . Erythromycin Diarrhea   Follow-up Information    [facility]. Go on 09/28/2018.   Why:  @9 :50am Contact information: [address] [place] [id number] [phone]       [name] Follow up.   Contact information: [address] [place] [id number] [phone]            The results of significant diagnostics from this hospitalization (including imaging, microbiology, ancillary and laboratory) are listed below for reference.    Significant Diagnostic Studies: Dg Chest 2 View  Result Date: 09/16/2018 CLINICAL DATA:  Shortness of breath EXAM: CHEST - 2 VIEW COMPARISON:  08/03/2018 FINDINGS: The heart size and mediastinal contours are within normal limits. Both  lungs are clear. The visualized skeletal structures are unremarkable. IMPRESSION: No active cardiopulmonary disease. Electronically Signed   By: Jasmine PangKim  Fujinaga M.D.   On: 09/16/2018 23:41   Nm Myocar Multi W/spect W/wall Motion / Ef  Result Date: 09/18/2018  There was no ST segment deviation noted during stress.  No T wave inversion was noted during stress.  Nuclear stress EF: 57%. Mild septal wall hypokinesis.  Defect 1: There is a medium defect present in the basal inferoseptal and mid inferoseptal location.  Findings consistent with prior myocardial infarction with peri-infarct ischemia.  This is an intermediate risk study.  Donato SchultzMark Skains, MD   Dg Hip Unilat With Pelvis 2-3 Views Right  Result Date: 09/16/2018 CLINICAL DATA:  Hip pain EXAM: DG HIP (WITH OR WITHOUT PELVIS) 2-3V RIGHT COMPARISON:  04/18/2018 FINDINGS: SI joints are non widened. Pubic symphysis and rami are intact. Status  post right hip replacement with normal alignment. No fracture seen IMPRESSION: Status post right hip replacement without acute osseous abnormality Electronically Signed   By: Jasmine Pang M.D.   On: 09/16/2018 23:42    Microbiology: No results found for this or any previous visit (from the past 240 hour(s)).   Labs: Basic Metabolic Panel: Recent Labs  Lab 09/16/18 2357 09/19/18 0701 09/20/18 0836  NA 135 138 140  K 4.1 4.1 4.0  CL 101 106 105  CO2 GLUCOSE 105* 98 153*  BUN CREATININE 1.15 1.28* 1.05  CALCIUM 9.4 8.8* 8.9   Liver Function Tests: Recent Labs  Lab 09/16/18 2357  AST 23  ALT 16  ALKPHOS 92  BILITOT 0.4  PROT 6.8  ALBUMIN 3.8   No results for input(s): LIPASE, AMYLASE in the last 168 hours. No results for input(s): AMMONIA in the last 168 hours. CBC: Recent Labs  Lab 09/16/18 2357 09/18/18 0846 09/19/18 0436 09/20/18 0414  WBC 4.4 4.3 4.2 4.6  NEUTROABS 2.6  --   --   --   HGB 13.3 13.7 13.4 12.9*  HCT 40.4 41.5 40.4 38.6*  MCV 93.3 94.1 94.0  93.7  PLT 238 242 225 199   Cardiac Enzymes: Recent Labs  Lab 09/16/18 2357 09/17/18 0227 09/17/18 0531 09/17/18 0721  TROPONINI 0.04* 0.04* 0.03* 0.04*   BNP: BNP (last 3 results) Recent Labs    09/17/18 0227  BNP 21.6    ProBNP (last 3 results) No results for input(s): PROBNP in the last 8760 hours.  CBG: No results for input(s): GLUCAP in the last 168 hours.     Signed:  Albertine Grates MD, PhD  [facility] 09/20/2018, 1:30 PM

## 2018-09-20 NOTE — Progress Notes (Signed)
Patient is up to the bathroom with assists with equipment has a a cane at baseline. Complained of mild lightheadedness from bed to sitting, encouraged patient to sit up in chair and he said he feels weak and proceeded to get back in bed.

## 2018-09-20 NOTE — Clinical Social Work Note (Signed)
CSW acknowledges cab voucher consult. Patient stated a staff member from his ACT team will pick him up around 4:30 today.  CSW signing off.  Charlynn Court, CSW [phone]

## 2018-09-20 NOTE — Progress Notes (Signed)
Spoke with theresa chow at [employer] and she stated that she had a emergency and would be here to pick up at 1630 patient waiting on medication to be delivered.

## 2018-09-22 ENCOUNTER — Other Ambulatory Visit: Payer: Self-pay

## 2018-09-22 ENCOUNTER — Encounter (HOSPITAL_COMMUNITY): Payer: Self-pay | Admitting: Emergency Medicine

## 2018-09-22 ENCOUNTER — Emergency Department (HOSPITAL_COMMUNITY): Payer: Medicaid Other

## 2018-09-22 ENCOUNTER — Emergency Department (HOSPITAL_COMMUNITY)
Admission: EM | Admit: 2018-09-22 | Discharge: 2018-09-25 | Disposition: A | Discharge status: Home / Self Care | Payer: Medicaid Other | Attending: Emergency Medicine | Admitting: Emergency Medicine

## 2018-09-22 DIAGNOSIS — Z20828 Contact with and (suspected) exposure to other viral communicable diseases: Secondary | ICD-10-CM | POA: Insufficient documentation

## 2018-09-22 DIAGNOSIS — R45851 Suicidal ideations: Secondary | ICD-10-CM | POA: Insufficient documentation

## 2018-09-22 DIAGNOSIS — F332 Major depressive disorder, recurrent severe without psychotic features: Secondary | ICD-10-CM | POA: Insufficient documentation

## 2018-09-22 DIAGNOSIS — R531 Weakness: Secondary | ICD-10-CM

## 2018-09-22 DIAGNOSIS — R072 Precordial pain: Secondary | ICD-10-CM | POA: Insufficient documentation

## 2018-09-22 LAB — CBC
HCT: 44.6 % (ref 39.0–52.0)
Hemoglobin: 15.1 g/dL (ref 13.0–17.0)
MCH: 31.5 pg (ref 26.0–34.0)
MCHC: 33.9 g/dL (ref 30.0–36.0)
MCV: 92.9 fL (ref 80.0–100.0)
Platelets: 283 10*3/uL (ref 150–400)
RBC: 4.8 MIL/uL (ref 4.22–5.81)
RDW: 13.4 % (ref 11.5–15.5)
WBC: 5.3 10*3/uL (ref 4.0–10.5)
nRBC: 0 % (ref 0.0–0.2)

## 2018-09-22 LAB — ETHANOL: Alcohol, Ethyl (B): <10 mg/dL (ref <10)

## 2018-09-22 LAB — COMPREHENSIVE METABOLIC PANEL
ALT: 34 U/L (ref 0–44)
AST: 34 U/L (ref 15–41)
Albumin: 4.4 g/dL (ref 3.5–5.0)
Alkaline Phosphatase: 113 U/L (ref 38–126)
Anion gap: 14 (ref 5–15)
BUN: 21 mg/dL — ABNORMAL HIGH (ref 6–20)
CO2: 27 mmol/L (ref 22–32)
Calcium: 10.1 mg/dL (ref 8.9–10.3)
Chloride: 97 mmol/L — ABNORMAL LOW (ref 98–111)
Creatinine, Ser: 1.44 mg/dL — ABNORMAL HIGH (ref 0.61–1.24)
GFR calc Af Amer: >60 mL/min (ref >60)
GFR calc non Af Amer: 55 mL/min — ABNORMAL LOW (ref >60)
Glucose, Bld: 169 mg/dL — ABNORMAL HIGH (ref 70–99)
Potassium: 4.2 mmol/L (ref 3.5–5.1)
Sodium: 138 mmol/L (ref 135–145)
Total Bilirubin: 0.6 mg/dL (ref 0.3–1.2)
Total Protein: 7.5 g/dL (ref 6.5–8.1)

## 2018-09-22 LAB — RAPID URINE DRUG SCREEN, HOSP PERFORMED
Amphetamines: NOT DETECTED
Barbiturates: NOT DETECTED
Benzodiazepines: NOT DETECTED
Cocaine: NOT DETECTED
Opiates: NOT DETECTED
Tetrahydrocannabinol: NOT DETECTED

## 2018-09-22 LAB — SALICYLATE LEVEL: Salicylate Lvl: <7 mg/dL (ref 2.8–30.0)

## 2018-09-22 LAB — ACETAMINOPHEN LEVEL: Acetaminophen (Tylenol), Serum: <10 ug/mL — ABNORMAL LOW (ref 10–30)

## 2018-09-22 LAB — TROPONIN I: Troponin I: <0.03 ng/mL (ref <0.03)

## 2018-09-22 IMAGING — DX PORTABLE CHEST - 1 VIEW
1 series · 1 of 1 positions shown · non-contrast
Comparison: Portable exam [O9] hours compared to [DATE]

CLINICAL DATA: Chest pain

EXAM:
PORTABLE CHEST 1 VIEW

[chest]
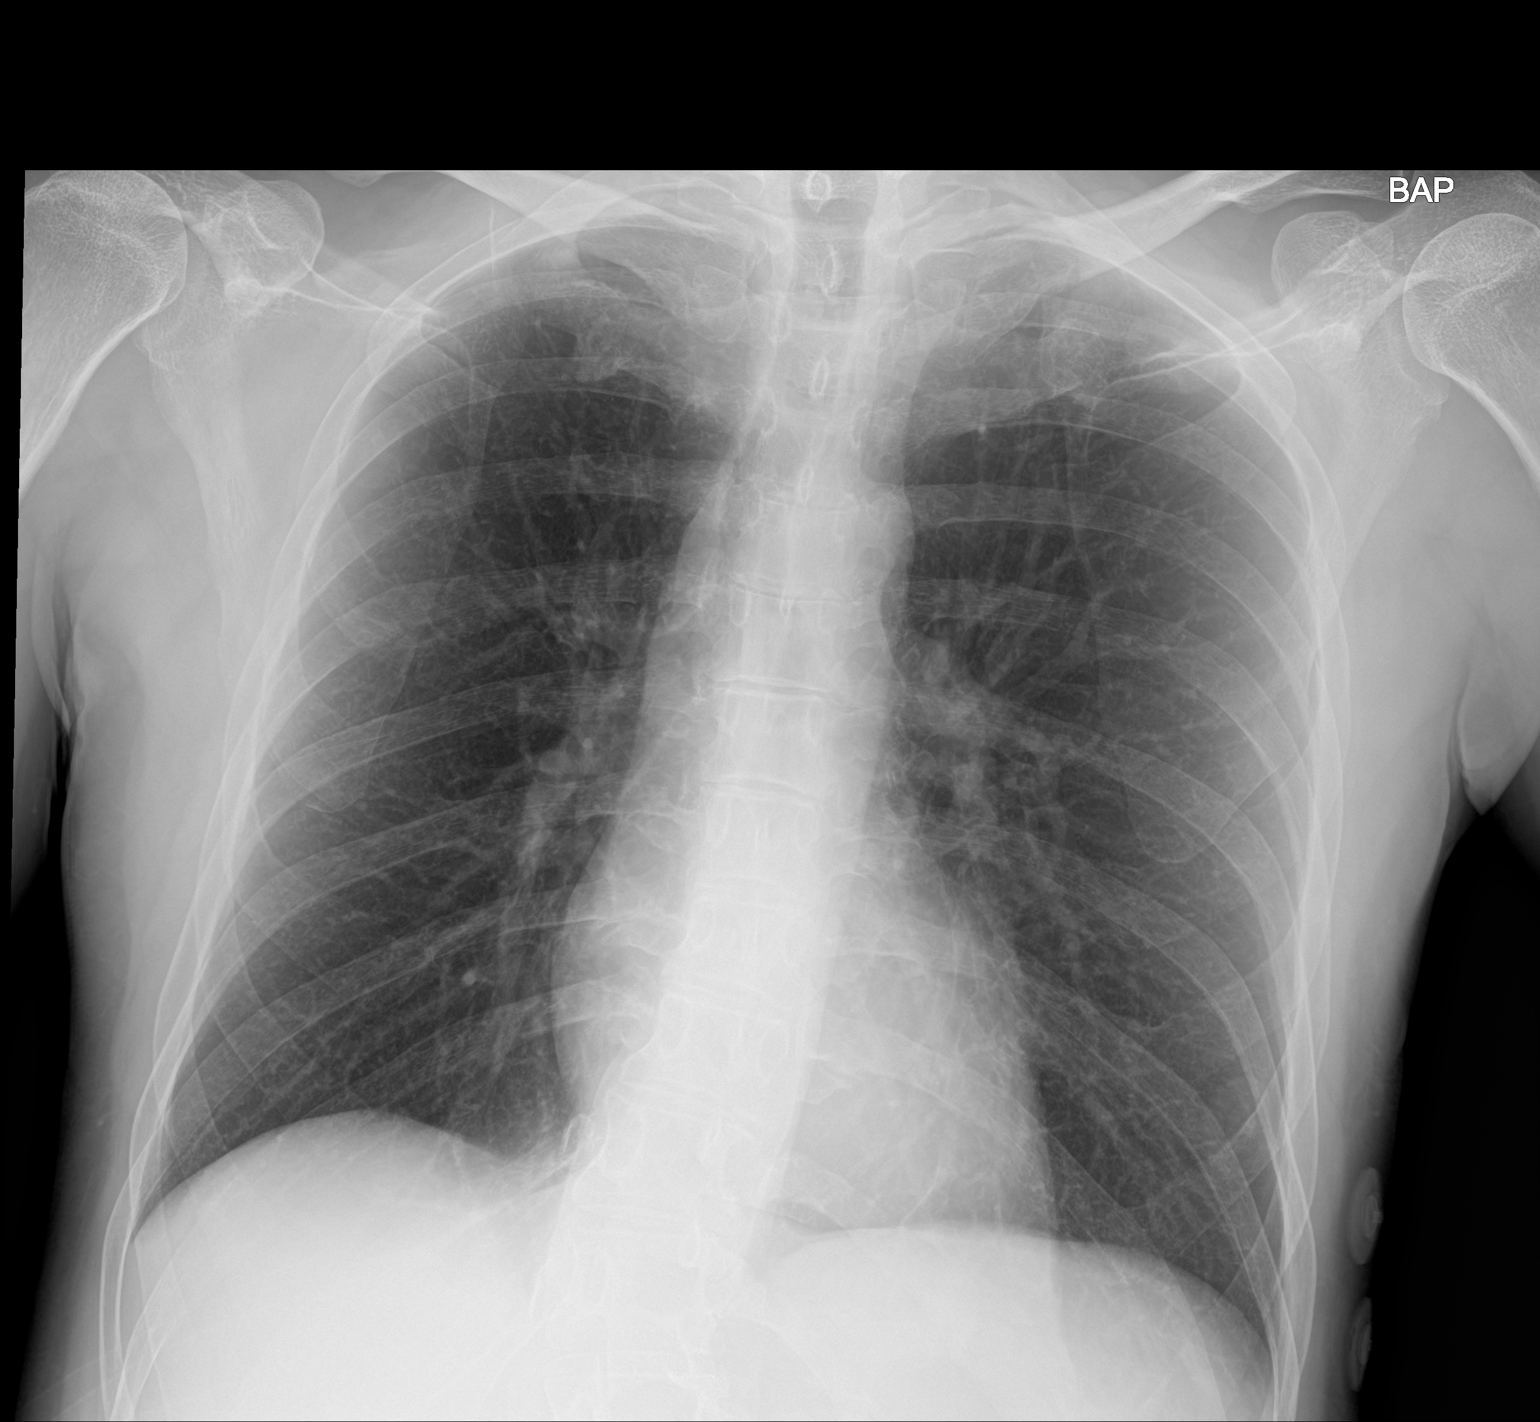

[1 of 1 positions shown; findings below may reference images not displayed]

FINDINGS: Normal heart size, mediastinal contours, and pulmonary vascularity.

Lungs clear.

No pulmonary infiltrate, pleural effusion or pneumothorax.

Bones appear slightly demineralized.
IMPRESSION: No acute abnormalities.

## 2018-09-22 MED ORDER — ADULT MULTIVITAMIN W/MINERALS CH
1.0000 | ORAL_TABLET | Freq: Every day | ORAL | Status: DC
  Administered 2018-09-22 – 2018-09-25 (×4): 1 via ORAL
  Filled 2018-09-22 (×4): qty 1

## 2018-09-22 MED ORDER — OLANZAPINE 5 MG PO TABS
7.5000 mg | ORAL_TABLET | Freq: Every day | ORAL | Status: DC
  Administered 2018-09-22 – 2018-09-24 (×3): 7.5 mg via ORAL
  Filled 2018-09-22 (×3): qty 2

## 2018-09-22 MED ORDER — TAMSULOSIN HCL 0.4 MG PO CAPS
0.4000 mg | ORAL_CAPSULE | Freq: Every day | ORAL | Status: DC
  Administered 2018-09-22 – 2018-09-25 (×4): 0.4 mg via ORAL
  Filled 2018-09-22 (×4): qty 1

## 2018-09-22 MED ORDER — DICYCLOMINE HCL 10 MG PO CAPS
10.0000 mg | ORAL_CAPSULE | Freq: Every day | ORAL | Status: DC
  Administered 2018-09-22 – 2018-09-25 (×4): 10 mg via ORAL
  Filled 2018-09-22 (×4): qty 1

## 2018-09-22 MED ORDER — ESCITALOPRAM OXALATE 10 MG PO TABS
10.0000 mg | ORAL_TABLET | Freq: Every day | ORAL | Status: DC
  Administered 2018-09-22 – 2018-09-25 (×4): 10 mg via ORAL
  Filled 2018-09-22 (×4): qty 1

## 2018-09-22 MED ORDER — ATORVASTATIN CALCIUM 10 MG PO TABS
20.0000 mg | ORAL_TABLET | Freq: Every day | ORAL | Status: DC
  Administered 2018-09-22 – 2018-09-24 (×3): 20 mg via ORAL
  Filled 2018-09-22 (×3): qty 2

## 2018-09-22 MED ORDER — FAMOTIDINE 20 MG PO TABS
20.0000 mg | ORAL_TABLET | Freq: Every day | ORAL | Status: DC
  Administered 2018-09-22 – 2018-09-25 (×4): 20 mg via ORAL
  Filled 2018-09-22 (×4): qty 1

## 2018-09-22 MED ORDER — GABAPENTIN 400 MG PO CAPS
400.0000 mg | ORAL_CAPSULE | Freq: Two times a day (BID) | ORAL | Status: DC
  Administered 2018-09-22 – 2018-09-25 (×6): 400 mg via ORAL
  Filled 2018-09-22 (×7): qty 1

## 2018-09-22 MED ORDER — VITAMIN B-12 1000 MCG PO TABS
1000.0000 ug | ORAL_TABLET | Freq: Every day | ORAL | Status: DC
  Administered 2018-09-22 – 2018-09-25 (×4): 1000 ug via ORAL
  Filled 2018-09-22 (×4): qty 1

## 2018-09-22 MED ORDER — VITAMIN D 25 MCG (1000 UNIT) PO TABS
1000.0000 [IU] | ORAL_TABLET | Freq: Every day | ORAL | Status: DC
  Administered 2018-09-22 – 2018-09-25 (×4): 1000 [IU] via ORAL
  Filled 2018-09-22 (×4): qty 1

## 2018-09-22 NOTE — BH Assessment (Addendum)
Assessment Note  Gary [name] is an 53 y.o. male that presents this date with IVC. Per IVC: "Respondent presents with S/I with a plan to overdose on medications. Diagnosis of Bipolar and MDD. Respondent states he is overwhelmed with living by himself." Patient brought in by [name] with IVC from [name] and was sent to [name] due to patient not being able to ambulate. Patient reports multiple health issues stating he had a hip replacement last year and has been "so depressed" since then. Patient states he walks with a cane but is in "a lot of pain." Patient denies any SA issues and reports he lives alone with limited support. Patient states he was diagnosed with MDD in 2019 after his surgery and is currently being followed by [name] who assists with medication management. Patient reports increased depression over the last month with symptoms to include: feeling useless and excessive fatigue. Patient reports ongoing S/I due to "not having anyone" and states that he is going to overdose on his medical medications. Patient denies any previous attempts or gestures at self harm. Patient per chart was last seen on 09/17/18 when he presented with excessive anxiety and passive S/I. Patient did not meet inpatient criteria at that time. Patient reports medication compliance until two days ago when he stated he "just gave up on everything." Patient presents with depressed affect and speaks in a low soft voice. Patent is oriented x 4 and does not appear to be responding to any internal stimuli. Patient states he feels depressed because he is frequently in pain due to his hip surgery. Patient states he has no other supports aside from his ACTT team. Patient states he has difficulty walking due to his pain and this is what causes him to feel depressed. Patient denies HI and denies AVH. Pt denies any other complaints at present. Case was staffed with [name] who recommended a inpatient admission to assist with  stabilization.   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 Anxiety   . Bipolar 2 disorder (HCC)   . Depression   . GERD (gastroesophageal reflux disease)   . Hyperhydrosis disorder     Past Surgical History:  Procedure Laterality Date  . HERNIA REPAIR    . HIP SURGERY Right   . LEFT HEART CATH AND CORONARY ANGIOGRAPHY N/A 09/19/2018   Procedure: LEFT HEART CATH AND CORONARY ANGIOGRAPHY;  Surgeon: [name], MD;  Location: [facility] INVASIVE CV LAB;  Service: Cardiovascular;  Laterality: N/A;    Family History: No family history on file.  Social History:  reports that he has never smoked. He has never used smokeless tobacco. He reports that he does not drink alcohol or use drugs.  Additional Social History:  Alcohol / Drug Use Pain Medications: See MAR Prescriptions: See MAR Over the Counter: See MAR History of alcohol / drug use?: No history of alcohol / drug abuse Longest period of sobriety (when/how long): NA Negative Consequences of Use: (NA) Withdrawal Symptoms: (NA)  CIWA:   COWS:    Allergies:  Allergies  Allergen Reactions  . Bee Venom Swelling    Swelling at site of sting  . Erythromycin Diarrhea    Home Medications: (Not in a hospital admission)   OB/GYN Status:  No LMP for male patient.  General Assessment Data Location of Assessment: [facility] ED TTS Assessment: In system Is this a Tele or Face-to-Face Assessment?: Face-to-Face Is  this an Initial Assessment or a Re-assessment for this encounter?: Initial Assessment Patient Accompanied by:: (NA) Language Other than English: No Living Arrangements: Other (Comment)(Alone) What gender do you identify as?: Male Marital status: Single Pregnancy Status: No Living Arrangements: Alone Can pt return to current living arrangement?: Yes Admission Status: Involuntary [name]: Other([name]) Is patient capable of  signing voluntary admission?: Yes Referral Source: Self/Family/Friend Insurance type: MCD  Medical Screening Exam [facility] Walk-in ONLY) Medical Exam completed: No Reason for MSE not completed: Other:(In process)  Crisis Care Plan Living Arrangements: Alone Legal Guardian: (NA) Name of Psychiatrist: [name] Name of Therapist: [name]  Education Status Is patient currently in school?: No Is the patient employed, unemployed or receiving disability?: Receiving disability income  Risk to self with the past 6 months Suicidal Ideation: Yes-Currently Present Has patient been a risk to self within the past 6 months prior to admission? : No Suicidal Intent: Yes-Currently Present Has patient had any suicidal intent within the past 6 months prior to admission? : No Is patient at risk for suicide?: Yes Suicidal Plan?: Yes-Currently Present Has patient had any suicidal plan within the past 6 months prior to admission? : No Specify Current Suicidal Plan: Overdose Access to Means: Yes Specify Access to Suicidal Means: Pt has medications What has been your use of drugs/alcohol within the last 12 months?: Denies Previous Attempts/Gestures: No How many times?: 0 Other Self Harm Risks: NA Triggers for Past Attempts: (NA) Intentional Self Injurious Behavior: None Family Suicide History: No Recent stressful life event(s): Other (Comment)(Declining health) Persecutory voices/beliefs?: No Depression: Yes Depression Symptoms: Fatigue, Feeling worthless/self pity Substance abuse history and/or treatment for substance abuse?: No Suicide prevention information given to non-admitted patients: Not applicable  Risk to Others within the past 6 months Homicidal Ideation: No Does patient have any lifetime risk of violence toward others beyond the six months prior to admission? : No Thoughts of Harm to Others: No Current Homicidal Intent: No Current Homicidal Plan: No Access to Homicidal Means:  No Identified Victim: NA History of harm to others?: No Assessment of Violence: None Noted Violent Behavior Description: NA Does patient have access to weapons?: No Criminal Charges Pending?: No Does patient have a court date: No Is patient on probation?: No  Psychosis Hallucinations: None noted Delusions: None noted  Mental Status Report Appearance/Hygiene: In scrubs Eye Contact: Good Motor Activity: Freedom of movement Speech: Logical/coherent Level of Consciousness: Alert Mood: Depressed Affect: Appropriate to circumstance Anxiety Level: Minimal Thought Processes: Coherent, Relevant Judgement: Partial Orientation: Person, Place, Time Obsessive Compulsive Thoughts/Behaviors: None  Cognitive Functioning Concentration: Normal Memory: Recent Intact, Remote Intact Is patient IDD: No Insight: Good Impulse Control: Good Appetite: Fair Have you had any weight changes? : No Change Sleep: No Change Total Hours of Sleep: 7 Vegetative Symptoms: None  ADLScreening [facility] Assessment Services) Patient's cognitive ability adequate to safely complete daily activities?: Yes Patient able to express need for assistance with ADLs?: Yes Independently performs ADLs?: Yes (appropriate for developmental age)  Prior Inpatient Therapy Prior Inpatient Therapy: Yes Prior Therapy Dates: 2019 Prior Therapy Facilty/Provider(s): [facility] Reason for Treatment: MH issues  Prior Outpatient Therapy Prior Outpatient Therapy: Yes Prior Therapy Dates: Ongoing Prior Therapy Facilty/Provider(s): [name] Reason for Treatment: Med mang Does patient have an ACCT team?: Yes Does patient have Intensive In-House Services?  : No Does patient have [name] services? : Yes Does patient have P4CC services?: No  ADL Screening (condition at time of admission) Patient's cognitive ability adequate to safely complete daily activities?:  Yes Is the patient deaf or have difficulty hearing?: No Does the patient have  difficulty seeing, even when wearing glasses/contacts?: No Does the patient have difficulty concentrating, remembering, or making decisions?: No Patient able to express need for assistance with ADLs?: Yes Does the patient have difficulty dressing or bathing?: No Independently performs ADLs?: Yes (appropriate for developmental age) Does the patient have difficulty walking or climbing stairs?: No Weakness of Legs: Both Weakness of Arms/Hands: None  Home Assistive Devices/Equipment Home Assistive Devices/Equipment: Cane (specify quad or straight)(Straight)  Therapy Consults (therapy consults require a physician order) PT Evaluation Needed: No OT Evalulation Needed: No SLP Evaluation Needed: No Abuse/Neglect Assessment (Assessment to be complete while patient is alone) Physical Abuse: Denies Verbal Abuse: Denies Sexual Abuse: Denies Exploitation of patient/patient's resources: Denies Self-Neglect: Denies Values / Beliefs Cultural Requests During Hospitalization: None Spiritual Requests During Hospitalization: None Consults Spiritual Care Consult Needed: No Social Work Consult Needed: No Merchant navy officerAdvance Directives (For Healthcare) Does Patient Have a Medical Advance Directive?: No Would patient like information on creating a medical advance directive?: No - Patient declined          Disposition: Case was staffed with Arville CareParks FNP who recommended a inpatient admission to assist with stabilization.   Disposition Initial Assessment Completed for this Encounter: Yes Disposition of Patient: (Observe and monitor) Patient refused recommended treatment: No Mode of transportation if patient is discharged/movement?: (Unk)  On Site Evaluation by:   Reviewed with Physician:    Alfredia Fergusonavid L Skylynne Schlechter 09/22/2018 5:02 PM

## 2018-09-22 NOTE — ED Notes (Signed)
Spoke with lab who said that they can add the troponin on to previously-drawn labs.

## 2018-09-22 NOTE — ED Notes (Signed)
Representative from [place] said that in the past cogentin helped pt with his gait problem.  Pt said that he used to take Cogentin 1 mg daily.

## 2018-09-22 NOTE — ED Provider Notes (Signed)
Emergency Department Provider Note   I have reviewed the triage vital signs and the nursing notes.   HISTORY  Chief Complaint Suicidal and Weakness   HPI Gary [name] is a 53 y.o. male with PMH of CAD, GERD, Bipolar disorder, and anxiety who presents to the emergency department for evaluation of suicidal ideation with associated medical complaints.  Patient arrived under IVC from [facility] via [facility].  He presented there saying that he was feeling suicidal with thoughts of harming himself.  He denied any auditory or visual hallucinations.  No homicidal ideation.  At that time the patient told [name] that he is feeling unsteady on his feet, generalized weakness, and chest discomfort.  Patient describes central chest discomfort which began this morning and has been constant throughout the day.  He has not been eating today.  He describes the leg weakness as "tremling" and "shuffling."  He has been able to walk with the assistance of his cane.  He denies any falls.  No unilateral weakness, numbness in the legs.  No lower back pain.   Past Medical History:  Diagnosis Date  . Anxiety   . Bipolar 2 disorder (HCC)   . Depression   . GERD (gastroesophageal reflux disease)   . Hyperhydrosis disorder     Patient Active Problem List   Diagnosis Date Noted  . Abnormal nuclear stress test   . SOB (shortness of breath) 09/17/2018  . NSTEMI (non-ST elevated myocardial infarction) (HCC) 09/17/2018  . GERD (gastroesophageal reflux disease)   . Severe recurrent major depression without psychotic features (HCC) 11/06/2015  . GAD (generalized anxiety disorder) 11/06/2015    Past Surgical History:  Procedure Laterality Date  . HERNIA REPAIR    . HIP SURGERY Right   . LEFT HEART CATH AND CORONARY ANGIOGRAPHY N/A 09/19/2018   Procedure: LEFT HEART CATH AND CORONARY ANGIOGRAPHY;  Surgeon: [name], MD;  Location: [facility] INVASIVE CV LAB;  Service: Cardiovascular;  Laterality: N/A;     Allergies Bee venom and Erythromycin  No family history on file.  Social History Social History   Tobacco Use  . Smoking status: Never Smoker  . Smokeless tobacco: Never Used  Substance Use Topics  . Alcohol use: No  . Drug use: No    Review of Systems  Constitutional: No fever/chills. Positive generalized weakness.  Eyes: No visual changes. ENT: No sore throat. Cardiovascular: Positive chest pain. Respiratory: Denies shortness of breath. Gastrointestinal: No abdominal pain.  No nausea, no vomiting.  No diarrhea.  No constipation. Genitourinary: Negative for dysuria. Musculoskeletal: Negative for back pain.  Skin: Negative for rash. Neurological: Negative for headaches, focal weakness or numbness.  10-point ROS otherwise negative.  ____________________________________________   PHYSICAL EXAM:  VITAL SIGNS: Vitals:   09/22/18 1740  BP: 122/77  Pulse: 76  Resp: 18  Temp: 98.7 F (37.1 C)  SpO2: 98%    Constitutional: Alert and oriented. Well appearing and in no acute distress. Eyes: Conjunctivae are normal.  Head: Atraumatic. Nose: No congestion/rhinnorhea. Mouth/Throat: Mucous membranes are moist. Neck: No stridor. Cardiovascular: Normal rate, regular rhythm. Good peripheral circulation. Grossly normal heart sounds.   Respiratory: Normal respiratory effort.  No retractions. Lungs CTAB. Gastrointestinal: Soft and nontender. No distention.  Musculoskeletal: No lower extremity tenderness nor edema. No gross deformities of extremities.  Neurologic:  Normal speech and language. No gross focal neurologic deficits are appreciated. 5/5 strength in the upper and lower extremities with 2+ patellar reflexes bilaterally. Normal sensation in the bilateral LEs.  Skin:  Skin is warm, dry and intact. No rash noted.  ____________________________________________   LABS (all labs ordered are listed, but only abnormal results are displayed)  Labs Reviewed  COMPREHENSIVE  METABOLIC PANEL - Abnormal; Notable for the following components:      Result Value   Chloride 97 (*)    Glucose, Bld 169 (*)    BUN 21 (*)    Creatinine, Ser 1.44 (*)    GFR calc non Af Amer 55 (*)    All other components within normal limits  ACETAMINOPHEN LEVEL - Abnormal; Notable for the following components:   Acetaminophen (Tylenol), Serum <10 (*)    All other components within normal limits  ETHANOL  SALICYLATE LEVEL  CBC  TROPONIN I  RAPID URINE DRUG SCREEN, HOSP PERFORMED   ____________________________________________  EKG   EKG Interpretation  Date/Time:  Friday Sep 22 2018 16:37:38 EDT Ventricular Rate:  80 PR Interval:  104 QRS Duration: 86 QT Interval:  366 QTC Calculation: 422 R Axis:   85 Text Interpretation:  Sinus rhythm with short PR Otherwise normal ECG No STEMI.  Confirmed by Alona Bene [phone]) on 09/22/2018 5:53:34 PM       ____________________________________________  RADIOLOGY  Dg Chest Portable 1 View  Result Date: 09/22/2018 CLINICAL DATA:  Chest pain EXAM: PORTABLE CHEST 1 VIEW COMPARISON:  Portable exam 1641 hours compared to 09/16/2018 FINDINGS: Normal heart size, mediastinal contours, and pulmonary vascularity. Lungs clear. No pulmonary infiltrate, pleural effusion or pneumothorax. Bones appear slightly demineralized. IMPRESSION: No acute abnormalities. Electronically Signed   By: Ulyses Southward M.D.   On: 09/22/2018 17:09    ____________________________________________   PROCEDURES  Procedure(s) performed:   Procedures  None  ____________________________________________   INITIAL IMPRESSION / ASSESSMENT AND PLAN / ED COURSE  Pertinent labs & imaging results that were available during my care of the patient were reviewed by me and considered in my medical decision making (see chart for details).   Patient presents to the emergency department for medical clearance after presenting to outpatient psychiatric facility with suicidal  ideation.  He is under IVC order.  TTS is evaluating the patient in the ED. patient was complaining of chest pain but had a clean heart catheterization performed only days prior which is available for my review.  Plan for screening chest x-ray, lab work, and reassess.  The patient is also complaining of weakness in the lower extremities.  He has 5 out of 5 strength normal neurologic exam including reflexes.  No evidence to suspect Guyon Barr or acute spine emergency.  No indication for imaging at this time other than chest x-ray. First exam paperwork completed and filed.   Cath report 09/19/2018  The left ventricular systolic function is normal.  LV end diastolic pressure is normal. LVEDP 13 mm Hg.  The left ventricular ejection fraction is 55-65% by visual estimate.  There is no aortic valve stenosis.  No angiographically apparent coronary artery disease.   Continue preventive therapy.   05:50 PM  Patient's lab work and imaging reviewed with no acute findings.  EKG similar to prior.  Plan to medically clear the patient at this time.  No serial troponins with constant symptoms over the past 6+ hours and clean heart cath only days ago.  Will restart home medications.  TTS to evaluate.  ____________________________________________  FINAL CLINICAL IMPRESSION(S) / ED DIAGNOSES  Final diagnoses:  Suicidal ideation  Precordial chest pain  Generalized weakness     MEDICATIONS GIVEN DURING THIS  VISIT:  Medications  atorvastatin (LIPITOR) tablet 20 mg (has no administration in time range)  cholecalciferol (VITAMIN D3) tablet 1,000 Units (has no administration in time range)  dicyclomine (BENTYL) capsule 10 mg (has no administration in time range)  escitalopram (LEXAPRO) tablet 10 mg (has no administration in time range)  gabapentin (NEURONTIN) capsule 400 mg (has no administration in time range)  multivitamin with minerals tablet 1 tablet (has no administration in time range)  OLANZapine  (ZYPREXA) tablet 7.5 mg (has no administration in time range)  famotidine (PEPCID) tablet 20 mg (has no administration in time range)  tamsulosin (FLOMAX) capsule 0.4 mg (has no administration in time range)  vitamin B-12 (CYANOCOBALAMIN) tablet 1,000 mcg (has no administration in time range)    Note:  This document was prepared using Dragon voice recognition software and may include unintentional dictation errors.  Alona BeneJoshua Long, MD Emergency Medicine    Long, Arlyss RepressJoshua G, MD 09/22/18 [phone]

## 2018-09-22 NOTE — ED Notes (Signed)
Gave urine cup and requested urine.

## 2018-09-22 NOTE — Progress Notes (Signed)
Pt meets inpatient criteria per Elta Guadeloupe, FNP. Referral information has been sent to the following hospitals for review:  [facility]  [facility]  [facility]  [facility]  [facility]  [facility]  [facility]  [facility]  [facility]  [facility]   Disposition will continue to assist with inpatient placement needs.   Wells Guiles, LCSW, LCAS Disposition CSW [facility]/TTS [phone]

## 2018-09-22 NOTE — ED Notes (Signed)
Pt alert and oriented. Pt denies any pain or discomfort. Pt c/o of si with a plan to OD on medication. Pr denies any hi and avh at this time. Pt calm and cooperative. Pt sitter at the bedside. Pt safe, will continue to monitor.

## 2018-09-22 NOTE — ED Notes (Signed)
Dr. Jacqulyn Bath said that pt can eat. Dinner is served.

## 2018-09-22 NOTE — BH Assessment (Signed)
[facility] Assessment Progress Note  Case was staffed with Parks FNP who recommended a inpatient admission to assist with stabilization.     

## 2018-09-22 NOTE — ED Triage Notes (Signed)
Pt brought in by [employer], IVC'd from [facility] and sent d/t "not being able to walk at their outpatient center". Pt feels his gait has become more shuffled, endorses thoughts of harming self today, present for mo's per pt - plans to OD on medications. Denies any AH/VH or HI. States he has not been able to take meds x 2 days

## 2018-09-23 ENCOUNTER — Other Ambulatory Visit: Payer: Self-pay

## 2018-09-23 LAB — SARS CORONAVIRUS 2 BY RT PCR (HOSPITAL ORDER, PERFORMED IN ~~LOC~~ HOSPITAL LAB): SARS Coronavirus 2: NEGATIVE

## 2018-09-23 NOTE — ED Notes (Signed)
Pt in shower.  

## 2018-09-23 NOTE — BH Assessment (Signed)
[facility] Assessment Progress Note This Clinical research associate spoke with patient this date to evaluate current mental status. Patient continues to voice plan to self harm by overdosing if discharged. Patient is unable to contact for safety and is requesting to be admitted to a facility to assist with stabilization. Case was staffed with Money NP who recommended continued inpatient as appropriate bed placement is investigated.

## 2018-09-23 NOTE — Progress Notes (Signed)
Received Gary [name] at shift change in his room watching TV. The sitter is at the bedside. He verbalized feeling depressed. He denied feeling suicidal. He was given a snack per his request. He eventually drifted off to sleep after his 2200 medications and slept throughout  the night except for one bathroom visit.

## 2018-09-23 NOTE — ED Notes (Signed)
[facility] Counselor reassessed pt - continues to meet Inpt criteria.

## 2018-09-23 NOTE — ED Notes (Signed)
Pt noted weapons sent to security.

## 2018-09-23 NOTE — ED Notes (Signed)
Urinal removed from room as pt is able to ambulate to bathroom. Pt showered this am w/o difficulty.

## 2018-09-23 NOTE — ED Notes (Signed)
Copy of IVC papers faxed to [facility], copy sent to Medical Records, and all 3 sets on clipboard. 1st Exam completed prior to this RN's shift.

## 2018-09-23 NOTE — ED Notes (Signed)
Patient laying on left side eyes open at this time preparing to fall asleep

## 2018-09-23 NOTE — ED Provider Notes (Signed)
9:13 AM BP 128/75 (BP Location: Left Arm)   Pulse 68   Temp 98.3 F (36.8 C) (Oral)   Resp 19   Wt 56.2 kg   SpO2 100%   BMI 21.97 kg/m  S-Patient here ununder IVC, Hx of anxiety, BP2, Depression. Meets inpatient criteria for SI w/ plan to OD.  O-Patient resting comfortably with 1:1 sitter- eating breakfast  A-SI w/Plan   P- waiting for placement   Arthor Captain, PA-C 09/23/18 0915    Raeford Razor, MD 09/24/18 [phone]

## 2018-09-24 NOTE — ED Provider Notes (Signed)
Pt evaluated this am.  Pt reports he feels well.  He is eating breakfast no concerns.  Pt is IVCed.  He is waiting for placement Vitals stable   Elson Areas, [place] 09/24/18 [id number]    Derwood Kaplan, MD 09/25/18 [phone]

## 2018-09-24 NOTE — [facility] Counselor (Signed)
Reassessment note: Pt presents sitting up in hospital bed. He reports he is feeling a little better today "less Depressed". He states he is continuing to have suicidal thoughts. Pt denies HI, AVH & paranoia. No other concerns noted by pt. Inpt tx continues to be recommended.

## 2018-09-24 NOTE — Progress Notes (Signed)
CSW contacted/was contacted by some referral facilities with the following results:  Declined:  [facility] (no bed availability) [facility] (no bed availability)  Others are unknown.  TTS will continue to seek placement.  Vilma Meckel. Algis Greenhouse, MSW, LCSW Clinical Social Work/Disposition Phone: [phone] Fax: [phone]

## 2018-09-24 NOTE — ED Notes (Signed)
Lunch tray ordered 

## 2018-09-24 NOTE — ED Notes (Signed)
Breakfast ordered 

## 2018-09-24 NOTE — Progress Notes (Signed)
Received Gary [name] this PM in his room awake and watching the TV. He verbalized feeling better today. It was reinforced, placement to another facility is pending. He was compliant with his medications. He slept throughout the night without incident.

## 2018-09-25 ENCOUNTER — Other Ambulatory Visit: Payer: Self-pay

## 2018-09-25 ENCOUNTER — Inpatient Hospital Stay (HOSPITAL_COMMUNITY)
Admission: AD | Admit: 2018-09-25 | Discharge: 2018-09-29 | DRG: 885 | Disposition: A | Discharge status: Home / Self Care | Payer: Medicaid Other | Attending: Psychiatry | Admitting: Psychiatry

## 2018-09-25 ENCOUNTER — Other Ambulatory Visit: Payer: Self-pay | Admitting: Behavioral Health

## 2018-09-25 ENCOUNTER — Encounter (HOSPITAL_COMMUNITY): Payer: Self-pay

## 2018-09-25 DIAGNOSIS — N400 Enlarged prostate without lower urinary tract symptoms: Secondary | ICD-10-CM | POA: Diagnosis present

## 2018-09-25 DIAGNOSIS — F332 Major depressive disorder, recurrent severe without psychotic features: Secondary | ICD-10-CM | POA: Diagnosis not present

## 2018-09-25 DIAGNOSIS — K219 Gastro-esophageal reflux disease without esophagitis: Secondary | ICD-10-CM | POA: Diagnosis present

## 2018-09-25 DIAGNOSIS — F419 Anxiety disorder, unspecified: Secondary | ICD-10-CM | POA: Diagnosis present

## 2018-09-25 DIAGNOSIS — F329 Major depressive disorder, single episode, unspecified: Secondary | ICD-10-CM | POA: Diagnosis present

## 2018-09-25 DIAGNOSIS — Z96641 Presence of right artificial hip joint: Secondary | ICD-10-CM | POA: Diagnosis present

## 2018-09-25 DIAGNOSIS — F3181 Bipolar II disorder: Principal | ICD-10-CM | POA: Diagnosis present

## 2018-09-25 MED ORDER — MAGNESIUM HYDROXIDE 400 MG/5ML PO SUSP: 30.0000 mL | Freq: Every day | ORAL | Status: DC | PRN

## 2018-09-25 MED ORDER — ALUM & MAG HYDROXIDE-SIMETH 200-200-20 MG/5ML PO SUSP: 30.0000 mL | ORAL | Status: DC | PRN

## 2018-09-25 MED ORDER — OLANZAPINE 7.5 MG PO TABS
7.5000 mg | ORAL_TABLET | Freq: Every day | ORAL | Status: DC
  Filled 2018-09-25 (×3): qty 1

## 2018-09-25 MED ORDER — FAMOTIDINE 20 MG PO TABS
20.0000 mg | ORAL_TABLET | Freq: Every day | ORAL | Status: DC
  Administered 2018-09-26 – 2018-09-29 (×4): 20 mg via ORAL
  Filled 2018-09-25 (×6): qty 1

## 2018-09-25 MED ORDER — ESCITALOPRAM OXALATE 10 MG PO TABS
10.0000 mg | ORAL_TABLET | Freq: Every day | ORAL | Status: DC
  Administered 2018-09-26 – 2018-09-27 (×2): 10 mg via ORAL
  Filled 2018-09-25 (×3): qty 1

## 2018-09-25 MED ORDER — GABAPENTIN 300 MG PO CAPS
300.0000 mg | ORAL_CAPSULE | Freq: Three times a day (TID) | ORAL | Status: DC
  Administered 2018-09-25 – 2018-09-26 (×2): 300 mg via ORAL
  Filled 2018-09-25 (×9): qty 1

## 2018-09-25 MED ORDER — TAMSULOSIN HCL 0.4 MG PO CAPS
0.4000 mg | ORAL_CAPSULE | Freq: Every day | ORAL | Status: DC
  Administered 2018-09-26 – 2018-09-29 (×4): 0.4 mg via ORAL
  Filled 2018-09-25 (×6): qty 1

## 2018-09-25 MED ORDER — ACETAMINOPHEN 325 MG PO TABS: 650.0000 mg | ORAL_TABLET | Freq: Four times a day (QID) | ORAL | Status: DC | PRN

## 2018-09-25 NOTE — Progress Notes (Signed)
Admission Note: Patient is a 53 years old male admitted to the unit for symptoms of depression, anxiety and suicidal ideation.  Patient currently denies suicidal thoughts while in the hospital.  Presents with flat affect and depressed mood.  Patient is alert and oriented x 4.  States "he is overwhelmed with living alone and has no support system."  Reports his depression started about a year ago after having a hip replacement surgery.  States he is here to work on his depression and suicidal thoughts.  Admission plan of care reviewed and consent signed.  Skin assessment completed. Discoloration noted on bilateral lower extremities.  No contraband found.  Patient walks very slow with unsteady gait.  Needed a wheel chair to ambulate on the unit.  Patient oriented to the unit, staff and room.  Routine safety checks initiated.  Verbalizes understanding of unit rules and protocols.  Patient is safe on the unit.

## 2018-09-25 NOTE — ED Notes (Signed)
Lunch has been ordered  

## 2018-09-25 NOTE — Progress Notes (Signed)
D   Pt has remained in his bed sleeping this evening   He is a high fall risk and uses a walker   Pt has no interaction with others although he said he does not want to be alone A   Verbal support offered   Medications offered   Q 15 min checks R   Pt is safe at this time  [place] NOVEL CORONAVIRUS (COVID-19) DAILY CHECK-OFF SYMPTOMS - answer yes or no to each - every day NO YES  Have you had a fever in the past 24 hours?  . Fever (Temp > 37.80C / 100F) X   Have you had any of these symptoms in the past 24 hours? . New Cough .  Sore Throat  .  Shortness of Breath .  Difficulty Breathing .  Unexplained Body Aches   X   Have you had any one of these symptoms in the past 24 hours not related to allergies?   . Runny Nose .  Nasal Congestion .  Sneezing   X   If you have had runny nose, nasal congestion, sneezing in the past 24 hours, has it worsened?  X   EXPOSURES - check yes or no X   Have you traveled outside the state in the past 14 days?  X   Have you been in contact with someone with a confirmed diagnosis of COVID-19 or PUI in the past 14 days without wearing appropriate PPE?  X   Have you been living in the same home as a person with confirmed diagnosis of COVID-19 or a PUI (household contact)?    X   Have you been diagnosed with COVID-19?    X              What to do next: Answered NO to all: Answered YES to anything:   Proceed with unit schedule Follow the [facility] Inpatient Flowsheet.

## 2018-09-25 NOTE — H&P (Addendum)
Psychiatric Admission Assessment Adult  Patient Identification: Gary [name] MRN:  [id number] Date of Evaluation:  09/25/2018 Chief Complaint:  " depression" Principal Diagnosis: MDD Diagnosis:  Active Problems:   MDD (major depressive disorder)  History of Present Illness: 53 year old male, lives alone, on disability.Had initially presented ED initially on 4/25, reporting anxiety, depression, low energy, suicidal ideations which were characterized as passive. He was evaluated by Cardiology consultant and recommended for Heart Catheterization due to significant exertional dyspnea and elevated troponin on admission. Cath was negative for acute findings . Was discharged on 4/28. Returned to ED on 5/1 under IVC , due to suicidal ideations . He also reported generalized weakness, difficulty with ambulation. He has been in ED , awaiting inpatient admission. Endorses some partial improvement in his mood during this interim, but continues to describe sadness, depression and neurovegetative symptoms as below. At this time denies suicidal ideations. Attributes depression to limited social support and to difficulty with ambulation. Patient attributes poor ambulation to R  hip surgery/replacement, reports R hip area pain, " soreness", but also complains of generalized muscular weakness affecting arms/legs.  Associated Signs/Symptoms: Depression Symptoms:  depressed mood, anhedonia, suicidal thoughts without plan, loss of energy/fatigue, decreased appetite, (Hypo) Manic Symptoms:  None noted or endorsed  Anxiety Symptoms:  Endorses chronic anxiety, panic attacks Psychotic Symptoms:  Denies  PTSD Symptoms: Denies  Total Time spent with patient: 45 minutes  Past Psychiatric History: patient reports he has been diagnosed with Bipolar Disorder, but reports mainly depression and does not endorse clear history of manic episodes. Does endorse depression, which he describes as chronic. Denies history of  suicidal attempts or of self injurious behaviors. Denies history of psychosis. Denies history of PTSD. Reports one prior psychiatric admission in 2017, for depression.    Is the patient at risk to self? Yes.    Has the patient been a risk to self in the past 6 months? Yes.    Has the patient been a risk to self within the distant past? No.  Is the patient a risk to others? No.  Has the patient been a risk to others in the past 6 months? No.  Has the patient been a risk to others within the distant past? No.   Prior Inpatient Therapy:  as above Prior Outpatient Therapy:  [facility]- Dr. Bel[place]Spain  Alcohol Screening: 1. How often do you have a drink containing alcohol?: Never 2. How many drinks containing alcohol do you have on a typical day when you are drinking?: 1 or 2 3. How often do you have six or more drinks on one occasion?: Never AUDIT-C Score: 0 4. How often during the last year have you found that you were not able to stop drinking once you had started?: Never 5. How often during the last year have you failed to do what was normally expected from you becasue of drinking?: Never 6. How often during the last year have you needed a first drink in the morning to get yourself going after a heavy drinking session?: Never 7. How often during the last year have you had a feeling of guilt of remorse after drinking?: Never 8. How often during the last year have you been unable to remember what happened the night before because you had been drinking?: Never 9. Have you or someone else been injured as a result of your drinking?: No 10. Has a relative or friend or a doctor or another health worker been  concerned about your drinking or suggested you cut down?: No Alcohol Use Disorder Identification Test Final Score (AUDIT): 0 Substance Abuse History in the last 12 months: denies history of alcohol or drug abuse  Consequences of Substance Abuse: Denies  Previous Psychotropic  Medications: patient reports he has been on Prozac for " a long time" , changed to Lexapro a few days ago.He is also on Zyprexa( more recently added)  and on Neurontin for pain and anxiety. Denies medication side effects.  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  Past Medical History:  Diagnosis Date  . Anxiety   . Bipolar 2 disorder (HCC)   . Depression   . GERD (gastroesophageal reflux disease)   . Hyperhydrosis disorder     Past Surgical History:  Procedure Laterality Date  . HERNIA REPAIR    . HIP SURGERY Right   . LEFT HEART CATH AND CORONARY ANGIOGRAPHY N/A 09/19/2018   Procedure: LEFT HEART CATH AND CORONARY ANGIOGRAPHY;  Surgeon: [name], MD;  Location: [facility] INVASIVE CV LAB;  Service: Cardiovascular;  Laterality: N/A;   Family History: father deceased, died from complications of alcoholism, mother alive, one brother Family Psychiatric  History: denies mental illness or suicides in family. Father was alcoholic  Tobacco Screening:  does not smoke Social History: 52, single, no children, lives alone , on disability.  Social History   Substance and Sexual Activity  Alcohol Use No     Social History   Substance and Sexual Activity  Drug Use No    Additional Social History:  Allergies:   Allergies  Allergen Reactions  . Bee Venom Swelling    Swelling at site of sting  . Erythromycin Diarrhea   Lab Results: No results found for this or any previous visit (from the past 48 hour(s)).  Blood Alcohol level:  Lab Results  Component Value Date   ETH <10 09/22/2018   ETH <10 09/16/2018    Metabolic Disorder Labs:  No results found for: HGBA1C, MPG No results found for: PROLACTIN Lab Results  Component Value Date   CHOL 172 09/19/2018   TRIG 134 09/19/2018   HDL 38 (L)  09/19/2018   CHOLHDL 4.5 09/19/2018   VLDL 27 09/19/2018   LDLCALC 107 (H) 09/19/2018    Current Medications: Current Facility-Administered Medications  Medication Dose Route Frequency Provider Last Rate Last Dose  . acetaminophen (TYLENOL) tablet 650 mg  650 mg Oral Q6H PRN Denzil Magnuson, NP      . alum & mag hydroxide-simeth (MAALOX/MYLANTA) 200-200-20 MG/5ML suspension 30 mL  30 mL Oral Q4H PRN Denzil Magnuson, NP      . magnesium hydroxide (MILK OF MAGNESIA) suspension 30 mL  30 mL Oral Daily PRN Denzil Magnuson, NP       PTA Medications: Medications Prior to Admission  Medication Sig Dispense Refill Last Dose  . atorvastatin (LIPITOR) 20 MG tablet Take 1 tablet (20 mg total) by mouth daily at 6 PM. 30 tablet 0 not yet taken  . cholecalciferol (VITAMIN D3) 25 MCG (1000 UT) tablet Take 1,000 Units by mouth daily.    week ago  . dicyclomine (BENTYL) 10 MG capsule Take 10 mg by mouth daily.    week ago at am  . escitalopram (LEXAPRO) 10 MG tablet Take 10 mg by mouth daily.   no yet  taken at am  . gabapentin (NEURONTIN) 400 MG capsule Take 400 mg by mouth 2 (two) times daily.    week ago at pm  . Multiple Vitamin (MULTIVITAMIN WITH MINERALS) TABS tablet Take 1 tablet by mouth daily.   week ago  . OLANZapine (ZYPREXA) 7.5 MG tablet Take 7.5 mg by mouth at bedtime.    week ago at pm  . ranitidine (ZANTAC) 150 MG capsule Take 150 mg by mouth 2 (two) times daily.    week ago at pm  . tamsulosin (FLOMAX) 0.4 MG CAPS capsule Take 0.4 mg by mouth daily.    week ago at am  . vitamin B-12 (CYANOCOBALAMIN) 1000 MCG tablet Take 1,000 mcg by mouth daily.   week ago    Musculoskeletal: Strength & Muscle Tone: reports lower extremity weakness ( R) Gait & Station: walks with walker assistance  Patient leans: N/A  Psychiatric Specialty Exam: Physical Exam  Review of Systems  Constitutional: Negative for chills and fever.  HENT: Negative.   Eyes: Negative.   Respiratory: Negative.   Negative for cough and shortness of breath.   Cardiovascular: Negative.  Negative for chest pain.  Gastrointestinal: Negative.   Genitourinary: Negative.   Musculoskeletal:       Reports difficulty ambulating due to R hip  weakness  Also complains of generalized weakness     Skin: Negative.   Neurological: Positive for weakness. Negative for headaches.       Reports subjective generalized weakness - reports as chronic   Endo/Heme/Allergies: Negative.   Psychiatric/Behavioral: Positive for depression and suicidal ideas. The patient is nervous/anxious.   All other systems reviewed and are negative.   Blood pressure 121/72, pulse 98, temperature 99.3 F (37.4 C), temperature source Oral, resp. rate 16, height  (1.549 m), weight 55.8 kg, SpO2 95 %.Body mass index is 23.24 kg/m.  General Appearance: Fairly Groomed  Eye Contact:  Good  Speech:  Normal Rate  Volume:  Decreased  Mood:  Anxious and Depressed  Affect:  restricted/blunted  Thought Process:  Linear and Descriptions of Associations: Intact  Orientation:  Full (Time, Place, and Person)  Thought Content:  denies hallucinations, no delusions, not internally preoccupied  Suicidal Thoughts:  No at this time denies suicidal or self injurious ideations,denies homicidal or violent ideations, contracts for safety on unit   Homicidal Thoughts:  No  Memory:  recent and remote grossly intact   Judgement:  Fair  Insight:  Fair  Psychomotor Activity:  Decreased- no restlessness or agitation  Concentration:  Concentration: Good and Attention Span: Good  Recall:  Good  Fund of Knowledge:  Good  Language:  Fair  Akathisia:  Negative  Handed:  Right  AIMS (if indicated):     Assets:  Desire for Improvement Resilience  ADL's:  Fair   Cognition:  WNL  Sleep:       Treatment Plan Summary: Daily contact with patient to assess and evaluate symptoms and progress in treatment, Medication management, Plan inpatient treatment and  medications as below   Observation Level/Precautions:  15 minute checks- as reviewed with RN staff and patient , he is considered to be at risk for falls due to ambulation- patient is now ambulating in wheel chair , aware to call RN as needed   Laboratory:  Will check TSH, HgbA1C, Lipid Panel, repeat  BMP. Based on patient's report of muscular weakness and aching, will order CK as well .   Psychotherapy:  Milieu, group therapy   Medications:  Continue Zyprexa 7.5 mgr QHS, Lexapro 10 mgrs QDAY, Change Neurontin to 300 mgrs TID, continue  Famotidine 20 mgr QDAY for GERD , Flomax 0.4 mgr QDAY , Lipitor 20 mgrs QDAY  ( of note, he reports Lipitor was recently started and that muscle soreness and weakness predated onset of lipitor management)   Consultations:  As needed   Discharge Concerns: - limited support network, followed by ACT team   Estimated LOS: 4 days   Other:     Physician Treatment Plan for Primary Diagnosis:  MDD, consider Bipolar Disorder II depressed  Long Term Goal(s): Improvement in symptoms so as ready for discharge  Short Term Goals: Ability to identify changes in lifestyle to reduce recurrence of condition will improve, Ability to verbalize feelings will improve, Ability to disclose and discuss suicidal ideas, Ability to demonstrate self-control will improve, Ability to identify and develop effective coping behaviors will improve and Ability to maintain clinical measurements within normal limits will improve  Physician Treatment Plan for Secondary Diagnosis:  Major Depression Long Term Goal(s): Improvement in symptoms so as ready for discharge  Short Term Goals: Ability to identify changes in lifestyle to reduce recurrence of condition will improve, Ability to verbalize feelings will improve, Ability to disclose and discuss suicidal ideas, Ability to demonstrate self-control will improve, Ability to identify and develop effective coping behaviors will improve, Ability to maintain  clinical measurements within normal limits will improve and Compliance with prescribed medications will improve  I certify that inpatient services furnished can reasonably be expected to improve the patient's condition.    Craige Cotta, MD 5/4/20205:19 PM

## 2018-09-25 NOTE — ED Notes (Signed)
[facility] contacted about pt's breakfast tray. 

## 2018-09-25 NOTE — [facility] Suicide Risk Assessment (Signed)
[facility] Admission Suicide Risk Assessment   Nursing information obtained from:  Patient Demographic factors:  Male Current Mental Status:  Self-harm thoughts Loss Factors:  Financial problems / change in socioeconomic status Historical Factors:  NA Risk Reduction Factors:  NA  Total Time spent with patient: 45 minutes Principal Problem: MDD Diagnosis:  Active Problems:   MDD (major depressive disorder)   Subjective Data:   Continued Clinical Symptoms:  Alcohol Use Disorder Identification Test Final Score (AUDIT): 0 The "Alcohol Use Disorders Identification Test", Guidelines for Use in Primary Care, Second Edition.  World Science writer [facility]). Score between 0-7:  no or low risk or alcohol related problems. Score between 8-15:  moderate risk of alcohol related problems. Score between 16-19:  high risk of alcohol related problems. Score 20 or above:  warrants further diagnostic evaluation for alcohol dependence and treatment.   CLINICAL FACTORS:   53 year old male, lives alone, on disability. Followed by ACT team. Presented to ED on 5/1 due to anxiety, depression, low energy and other neuro-vegetative symptoms, passive SI. History of Mood Disorder, reports he has been diagnosed with Bipolar Disorder in the past . Attributes depression to social isolation, limited support network and to difficulties with ambulation secondary to muscular weakness and history of R hip surgery.   Psychiatric Specialty Exam: Physical Exam  ROS  Blood pressure 121/72, pulse 98, temperature 99.3 F (37.4 C), temperature source Oral, resp. rate 16, height 5\' 1"  (1.549 m), weight 55.8 kg, SpO2 95 %.Body mass index is 23.24 kg/m.  See admit note MSE  COGNITIVE FEATURES THAT CONTRIBUTE TO RISK:  Closed-mindedness and Loss of executive function    SUICIDE RISK:   Moderate:  Frequent suicidal ideation with limited intensity, and duration, some specificity in terms of plans, no associated intent, good  self-control, limited dysphoria/symptomatology, some risk factors present, and identifiable protective factors, including available and accessible social support.  PLAN OF CARE: Patient will be admitted to inpatient psychiatric unit for stabilization and safety. Will provide and encourage milieu participation. Provide medication management and maked adjustments as needed.  Will follow daily.    I certify that inpatient services furnished can reasonably be expected to improve the patient's condition.   Craige Cotta, MD 09/25/2018, 6:12 PM

## 2018-09-25 NOTE — Progress Notes (Addendum)
Pt accepted to  [facility], Bed 404-1 Elta Guadeloupe, NP is the accepting provider.  Nehemiah Massed, MD. is the attending provider.  Call report to [phone]  [facility] ED notified.   Pt is IVC .  Pt may be transported by [facility] Pt scheduled  to arrive at [facility] 11:00 AM  Carney Bern T. Kaylyn Lim, MSW, LCSW Disposition Clinical Social Work [phone] (cell) [phone] (office)

## 2018-09-25 NOTE — ED Notes (Signed)
Pt off unit to [facility] per order . Pt calm, cooperative, no s/s of distress. DC information given to [employer] . Belongings given to [employer]. Pt off unit in w/c with [employer] and NT.

## 2018-09-25 NOTE — Tx Team (Signed)
Initial Treatment Plan 09/25/2018 3:14 PM Gary [name] [id number]    PATIENT STRESSORS: Financial difficulties Health problems   PATIENT STRENGTHS: Average or above average intelligence Motivation for treatment/growth   PATIENT IDENTIFIED PROBLEMS: "less depress"  Suicidal Ideation  Anxiety                 DISCHARGE CRITERIA:  Adequate post-discharge living arrangements Motivation to continue treatment in a less acute level of care  PRELIMINARY DISCHARGE PLAN: Outpatient therapy Return to previous living arrangement  PATIENT/FAMILY INVOLVEMENT: This treatment plan has been presented to and reviewed with the patient, Gary [name], and/or family member.  The patient and family have been given the opportunity to ask questions and make suggestions.  Clarene Critchley, RN 09/25/2018, 3:14 PM

## 2018-09-25 NOTE — ED Provider Notes (Signed)
53 year old male with suicidal ideation.  Patient is excepted to behavioral health for inpatient management.  Patient resting comfortably in exam bed.  Vitals:   09/25/18 0604 09/25/18 1112  BP: 130/90 127/78  Pulse: 68 84  Resp: 16 14  Temp: 97.6 F (36.4 C) 98 F (36.7 C)  SpO2: 99% 99%     Eyvonne Mechanic, PA-C 09/25/18 1139    Melene Plan, DO 09/25/18 1450

## 2018-09-26 DIAGNOSIS — F332 Major depressive disorder, recurrent severe without psychotic features: Secondary | ICD-10-CM

## 2018-09-26 LAB — VITAMIN B12: Vitamin B-12: 267 pg/mL (ref 180–914)

## 2018-09-26 LAB — BASIC METABOLIC PANEL
Anion gap: 5 (ref 5–15)
BUN: 20 mg/dL (ref 6–20)
CO2: 29 mmol/L (ref 22–32)
Calcium: 8.9 mg/dL (ref 8.9–10.3)
Chloride: 104 mmol/L (ref 98–111)
Creatinine, Ser: 0.98 mg/dL (ref 0.61–1.24)
GFR calc Af Amer: >60 mL/min (ref >60)
GFR calc non Af Amer: >60 mL/min (ref >60)
Glucose, Bld: 103 mg/dL — ABNORMAL HIGH (ref 70–99)
Potassium: 4 mmol/L (ref 3.5–5.1)
Sodium: 138 mmol/L (ref 135–145)

## 2018-09-26 LAB — LIPID PANEL
Cholesterol: 156 mg/dL (ref 0–200)
HDL: 32 mg/dL — ABNORMAL LOW (ref >40)
LDL Cholesterol: 63 mg/dL (ref 0–99)
Total CHOL/HDL Ratio: 4.9 RATIO
Triglycerides: 306 mg/dL — ABNORMAL HIGH (ref <150)
VLDL: 61 mg/dL — ABNORMAL HIGH (ref 0–40)

## 2018-09-26 LAB — CK: Total CK: 51 U/L (ref 49–397)

## 2018-09-26 LAB — HEMOGLOBIN A1C
Hgb A1c MFr Bld: 5.4 % (ref 4.8–5.6)
Mean Plasma Glucose: 108.28 mg/dL

## 2018-09-26 LAB — TSH: TSH: 2.921 u[IU]/mL (ref 0.350–4.500)

## 2018-09-26 MED ORDER — TRIHEXYPHENIDYL HCL 2 MG PO TABS
2.0000 mg | ORAL_TABLET | Freq: Two times a day (BID) | ORAL | Status: DC
  Filled 2018-09-26 (×2): qty 1

## 2018-09-26 MED ORDER — GABAPENTIN 400 MG PO CAPS
400.0000 mg | ORAL_CAPSULE | Freq: Three times a day (TID) | ORAL | Status: DC
  Administered 2018-09-26 – 2018-09-29 (×10): 400 mg via ORAL
  Filled 2018-09-26 (×16): qty 1

## 2018-09-26 MED ORDER — OLANZAPINE 5 MG PO TABS
5.0000 mg | ORAL_TABLET | Freq: Every day | ORAL | Status: DC
  Administered 2018-09-26 – 2018-09-28 (×3): 5 mg via ORAL
  Filled 2018-09-26 (×5): qty 1

## 2018-09-26 NOTE — Progress Notes (Addendum)
D   Pt visible on the milieu this evening   He was on the phone yelling at whomever he was talking to   He uses a wheelchair to get around   He endorses depression and anxiety   Pt has very minimal interaction with others  A   Verbal support given    Medications administered and effectiveness monitored    Q 15 min checks R   Pt remains safe at this time  [place] NOVEL CORONAVIRUS (COVID-19) DAILY CHECK-OFF SYMPTOMS - answer yes or no to each - every day NO YES  Have you had a fever in the past 24 hours?  . Fever (Temp > 37.80C / 100F) X   Have you had any of these symptoms in the past 24 hours? . New Cough .  Sore Throat  .  Shortness of Breath .  Difficulty Breathing .  Unexplained Body Aches   X   Have you had any one of these symptoms in the past 24 hours not related to allergies?   . Runny Nose .  Nasal Congestion .  Sneezing   X   If you have had runny nose, nasal congestion, sneezing in the past 24 hours, has it worsened?  X   EXPOSURES - check yes or no X   Have you traveled outside the state in the past 14 days?  X   Have you been in contact with someone with a confirmed diagnosis of COVID-19 or PUI in the past 14 days without wearing appropriate PPE?  X   Have you been living in the same home as a person with confirmed diagnosis of COVID-19 or a PUI (household contact)?    X   Have you been diagnosed with COVID-19?    X              What to do next: Answered NO to all: Answered YES to anything:   Proceed with unit schedule Follow the [facility] Inpatient Flowsheet.

## 2018-09-26 NOTE — [facility] Suicide Risk Assessment (Signed)
[facility] INPATIENT:  Family/Significant Other Suicide Prevention Education  Suicide Prevention Education:  Education Completed; brother,Gary [name] [phone] has been identified by the patient as the family member/significant other with whom the patient will be residing, and identified as the person(s) who will aid the patient in the event of a mental health crisis (suicidal ideations/suicide attempt).  With written consent from the patient, the family member/significant other has been provided the following suicide prevention education, prior to the and/or following the discharge of the patient.  The suicide prevention education provided includes the following:  Suicide risk factors  Suicide prevention and interventions  National Suicide Hotline telephone number  [facility] assessment telephone number  [facility] Emergency Assistance 911  [facility] and/or Residential Mobile Crisis Unit telephone number  Request made of family/significant other to:  Remove weapons (e.g., guns, rifles, knives), all items previously/currently identified as safety concern.    Remove drugs/medications (over-the-counter, prescriptions, illicit drugs), all items previously/currently identified as a safety concern.  The family member/significant other verbalizes understanding of the suicide prevention education information provided.  The family member/significant other agrees to remove the items of safety concern listed above.  Brother, Gary [name], shared that patient lived at home and was totally cared for by their mother until 2-3 years ago. At this time, their mother, in her late 54's started a friendship with another widower and this was not agreeable to the patient. Patient became verbally abusive and their mother eventually took out a restraining order and the patient left the home.  Brother shares that the patient, through learned helplessness, will sabotage arrangements other than returning  to their mother's home. Patient has been in group homes, ALFs, SNFs, and now has his own apartment. Patient will refuse to eat, refuse to engage in PT/OT and other recovery for his hip replacement, and will not engage with others.   Brother feels it would be most appropriate for the patient to live in a group home or possibly an ALF, as the patient has frequently called ACTT crisis and mobile crisis in the short time he has lived independently. Per brother, patient called him several days ago and expressed SI without a plan. Brother does not think patient is a credible risk for suicide, and the brother called mobile crisis and later patient's ACT Team, as the brother stated he did not think patient would actually attempt suicide and he did not want to involve law enforcement.   Gary [name] 09/26/2018, 11:02 AM
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
## 2018-09-26 NOTE — Progress Notes (Signed)
DAR NOTE: Patient presents with flat affect and depressed mood.  Denies suicidal thoughts, auditory and visual hallucinations.  Rates depression at 5, hopelessness at 5, and anxiety at 5.  Maintained on routine safety checks.  Medications given as prescribed.  Support and encouragement offered as needed.  Patient ambulating on the unit with a wheel chair but is able to walk to the bathroom on his own.  Remained in his room for most of this shift.  Minimal interaction with staff.  Patient is safe on the unit.  Offered no complaint.
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
## 2018-09-27 MED ORDER — BENZTROPINE MESYLATE 0.5 MG PO TABS
0.5000 mg | ORAL_TABLET | Freq: Two times a day (BID) | ORAL | Status: DC
  Administered 2018-09-27 – 2018-09-29 (×4): 0.5 mg via ORAL
  Filled 2018-09-27 (×8): qty 1

## 2018-09-27 MED ORDER — ESCITALOPRAM OXALATE 20 MG PO TABS
20.0000 mg | ORAL_TABLET | Freq: Every day | ORAL | Status: DC
  Administered 2018-09-28 – 2018-09-29 (×2): 20 mg via ORAL
  Filled 2018-09-27 (×4): qty 1

## 2018-09-27 NOTE — Progress Notes (Signed)
D: Pt alert and oriented.  Pt reports denies pain. Pt denies experiencing any SI/HI, or AVH at this time.   Pt received PT this morning and a physical therapy consult was ordered by the physician.   This pt has been self isolative in his room most of the day.  A: Scheduled medications administered to pt, per MD orders. Support and encouragement provided. Frequent verbal contact made. Routine safety checks conducted q15 minutes.   R: No adverse drug reactions noted. Pt verbally contracts for safety at this time. Pt complaint with medications and treatment plan. Pt interacts with staff on the unit when interaction is intiated. Pt remains safe at this time. Will continue to monitor.

## 2018-09-27 NOTE — Tx Team (Signed)
Interdisciplinary Treatment and Diagnostic Plan Update  09/27/2018 Time of Session:  Gary [name] MRN: [id number]  Principal Diagnosis: MDD (major depressive disorder)  Secondary Diagnoses: Principal Problem:   MDD (major depressive disorder)   Current Medications:  Current Facility-Administered Medications  Medication Dose Route Frequency Provider Last Rate Last Dose  . acetaminophen (TYLENOL) tablet 650 mg  650 mg Oral Q6H PRN Mordecai Maes, NP      . alum & mag hydroxide-simeth (MAALOX/MYLANTA) 200-200-20 MG/5ML suspension 30 mL  30 mL Oral Q4H PRN Mordecai Maes, NP      . escitalopram (LEXAPRO) tablet 10 mg  10 mg Oral Daily Cobos, Myer Peer, MD   10 mg at 09/26/18 0824  . famotidine (PEPCID) tablet 20 mg  20 mg Oral Daily Cobos, Myer Peer, MD   20 mg at 09/26/18 0824  . gabapentin (NEURONTIN) capsule 400 mg  400 mg Oral TID Sharma Covert, MD   400 mg at 09/26/18 1647  . magnesium hydroxide (MILK OF MAGNESIA) suspension 30 mL  30 mL Oral Daily PRN Mordecai Maes, NP      . OLANZapine (ZYPREXA) tablet 5 mg  5 mg Oral QHS Sharma Covert, MD   5 mg at 09/26/18 2112  . tamsulosin (FLOMAX) capsule 0.4 mg  0.4 mg Oral Daily Cobos, Myer Peer, MD   0.4 mg at 09/26/18 8325   PTA Medications: Medications Prior to Admission  Medication Sig Dispense Refill Last Dose  . atorvastatin (LIPITOR) 20 MG tablet Take 1 tablet (20 mg total) by mouth daily at 6 PM. 30 tablet 0 not yet taken  . cholecalciferol (VITAMIN D3) 25 MCG (1000 UT) tablet Take 1,000 Units by mouth daily.    week ago  . dicyclomine (BENTYL) 10 MG capsule Take 10 mg by mouth daily.    week ago at am  . escitalopram (LEXAPRO) 10 MG tablet Take 10 mg by mouth daily.   no yet taken at am  . gabapentin (NEURONTIN) 400 MG capsule Take 400 mg by mouth 2 (two) times daily.    week ago at pm  . Multiple Vitamin (MULTIVITAMIN WITH MINERALS) TABS tablet Take 1 tablet by mouth daily.   week ago  . OLANZapine (ZYPREXA) 7.5  MG tablet Take 7.5 mg by mouth at bedtime.    week ago at pm  . ranitidine (ZANTAC) 150 MG capsule Take 150 mg by mouth 2 (two) times daily.    week ago at pm  . tamsulosin (FLOMAX) 0.4 MG CAPS capsule Take 0.4 mg by mouth daily.    week ago at am  . vitamin B-12 (CYANOCOBALAMIN) 1000 MCG tablet Take 1,000 mcg by mouth daily.   week ago    Patient Stressors: Financial difficulties Health problems  Patient Strengths: Average or above average intelligence Motivation for treatment/growth  Treatment Modalities: Medication Management, Group therapy, Case management,  1 to 1 session with clinician, Psychoeducation, Recreational therapy.   Physician Treatment Plan for Primary Diagnosis: MDD (major depressive disorder) Long Term Goal(s): Improvement in symptoms so as ready for discharge Improvement in symptoms so as ready for discharge   Short Term Goals: Ability to identify changes in lifestyle to reduce recurrence of condition will improve Ability to verbalize feelings will improve Ability to disclose and discuss suicidal ideas Ability to demonstrate self-control will improve Ability to identify and develop effective coping behaviors will improve Ability to maintain clinical measurements within normal limits will improve Ability to identify changes in lifestyle to reduce recurrence of  condition will improve Ability to verbalize feelings will improve Ability to disclose and discuss suicidal ideas Ability to demonstrate self-control will improve Ability to identify and develop effective coping behaviors will improve Ability to maintain clinical measurements within normal limits will improve Compliance with prescribed medications will improve  Medication Management: Evaluate patient's response, side effects, and tolerance of medication regimen.  Therapeutic Interventions: 1 to 1 sessions, Unit Group sessions and Medication administration.  Evaluation of Outcomes: Not Met  Physician  Treatment Plan for Secondary Diagnosis: Principal Problem:   MDD (major depressive disorder)  Long Term Goal(s): Improvement in symptoms so as ready for discharge Improvement in symptoms so as ready for discharge   Short Term Goals: Ability to identify changes in lifestyle to reduce recurrence of condition will improve Ability to verbalize feelings will improve Ability to disclose and discuss suicidal ideas Ability to demonstrate self-control will improve Ability to identify and develop effective coping behaviors will improve Ability to maintain clinical measurements within normal limits will improve Ability to identify changes in lifestyle to reduce recurrence of condition will improve Ability to verbalize feelings will improve Ability to disclose and discuss suicidal ideas Ability to demonstrate self-control will improve Ability to identify and develop effective coping behaviors will improve Ability to maintain clinical measurements within normal limits will improve Compliance with prescribed medications will improve     Medication Management: Evaluate patient's response, side effects, and tolerance of medication regimen.  Therapeutic Interventions: 1 to 1 sessions, Unit Group sessions and Medication administration.  Evaluation of Outcomes: Not Met   RN Treatment Plan for Primary Diagnosis: MDD (major depressive disorder) Long Term Goal(s): Knowledge of disease and therapeutic regimen to maintain health will improve  Short Term Goals: Ability to participate in decision making will improve, Ability to verbalize feelings will improve, Ability to disclose and discuss suicidal ideas and Ability to identify and develop effective coping behaviors will improve  Medication Management: RN will administer medications as ordered by provider, will assess and evaluate patient's response and provide education to patient for prescribed medication. RN will report any adverse and/or side effects to  prescribing provider.  Therapeutic Interventions: 1 on 1 counseling sessions, Psychoeducation, Medication administration, Evaluate responses to treatment, Monitor vital signs and CBGs as ordered, Perform/monitor CIWA, COWS, AIMS and Fall Risk screenings as ordered, Perform wound care treatments as ordered.  Evaluation of Outcomes: Not Met   LCSW Treatment Plan for Primary Diagnosis: MDD (major depressive disorder) Long Term Goal(s): Safe transition to appropriate next level of care at discharge, Engage patient in therapeutic group addressing interpersonal concerns.  Short Term Goals: Engage patient in aftercare planning with referrals and resources  Therapeutic Interventions: Assess for all discharge needs, 1 to 1 time with Social worker, Explore available resources and support systems, Assess for adequacy in community support network, Educate family and significant other(s) on suicide prevention, Complete Psychosocial Assessment, Interpersonal group therapy.  Evaluation of Outcomes: Not Met   Progress in Treatment: Attending groups: No. Participating in groups: No. Taking medication as prescribed: Yes. Toleration medication: Yes. Family/Significant other contact made: Yes, individual(s) contacted:  with the patient's brother Patient understands diagnosis: Yes. Discussing patient identified problems/goals with staff: Yes. Medical problems stabilized or resolved: Yes. Denies suicidal/homicidal ideation: Yes. Issues/concerns per patient self-inventory: No. Other:   New problem(s) identified: None   New Short Term/Long Term Goal(s):  medication stabilization, elimination of SI thoughts, development of comprehensive mental wellness plan.    Patient Goals:    Discharge Plan or Barriers:  Patient plans to return to his apartment. He will continue to receive services from his [facility] team for outpatient medication management and therapy services.   Reason for Continuation of  Hospitalization: Depression Medication stabilization Suicidal ideation  Estimated Length of Stay: 3-5 days   Attendees: Patient: 09/27/2018 8:20 AM  Physician: Dr. Myles Lipps, MD 09/27/2018 8:20 AM  Nursing: Arlyss Repress, RN  09/27/2018 8:20 AM  RN Care Manager: 09/27/2018 8:20 AM  Social Worker: Radonna Ricker, [place] 09/27/2018 8:20 AM  Recreational Therapist:  09/27/2018 8:20 AM  Other:  09/27/2018 8:20 AM  Other:  09/27/2018 8:20 AM  Other: 09/27/2018 8:20 AM    Scribe for Treatment Team: Marylee Floras, [place] 09/27/2018 8:20 AM

## 2018-09-27 NOTE — Progress Notes (Signed)
D: Pt denies SI/HI/AVH. Pt is pleasant and cooperative. Pt stated he felt "worse, more depressed" . Pt stayed in room much of the evening.  A: Pt was offered support and encouragement. Pt was given scheduled medications. Pt was encourage to attend groups. Q 15 minute checks were done for safety.  R:Pt attends groups and interacts well with peers and staff. Pt is taking medication. Pt has no complaints.Pt receptive to treatment and safety maintained on unit.  Problem: Education: Goal: Emotional status will improve Outcome: Progressing   Problem: Education: Goal: Mental status will improve Outcome: Progressing

## 2018-09-27 NOTE — [facility] Group Notes (Signed)
Pt did not attend wrap up group this evening. Pt was in bed.  

## 2018-09-27 NOTE — Progress Notes (Signed)
[place] NOVEL CORONAVIRUS (COVID-19) DAILY CHECK-OFF SYMPTOMS - answer yes or no to each - every day NO YES  Have you had a fever in the past 24 hours?  . Fever (Temp > 37.80C / 100F) X   Have you had any of these symptoms in the past 24 hours? . New Cough .  Sore Throat  .  Shortness of Breath .  Difficulty Breathing .  Unexplained Body Aches   X   Have you had any one of these symptoms in the past 24 hours not related to allergies?   . Runny Nose .  Nasal Congestion .  Sneezing   X   If you have had runny nose, nasal congestion, sneezing in the past 24 hours, has it worsened?  X   EXPOSURES - check yes or no X   Have you traveled outside the state in the past 14 days?  X   Have you been in contact with someone with a confirmed diagnosis of COVID-19 or PUI in the past 14 days without wearing appropriate PPE?  X   Have you been living in the same home as a person with confirmed diagnosis of COVID-19 or a PUI (household contact)?    X   Have you been diagnosed with COVID-19?    X              What to do next: Answered NO to all: Answered YES to anything:   Proceed with unit schedule Follow the [facility] Inpatient Flowsheet.   

## 2018-09-27 NOTE — Evaluation (Signed)
Occupational Therapy Evaluation Patient Details Name: Gary [name] MRN: [id number] DOB: [birth year] Today's Date: 09/27/2018    History of Present Illness 53 year old male, lives alone, on disability.Had initially presented ED initially on 4/25, reporting anxiety, depression, low energy, suicidal ideations which were characterized as passive. He was evaluated by Cardiology consultant and recommended for Heart Catheterization due to significant exertional dyspnea and elevated troponin on admission. Cath was negative for acute findings. Pt also reports a difficulty with generalized weakness and functional mobility    Clinical Impression   Pt admitted with above diagnoses, presenting with generalized weakness, decreased activity tolerance, and shuffling functional mobility impacting ability to complete BADL at desired level of ind. PTA, pt lives aloe with limited support. He shares that he had a R hip replacement about 1 year ago, per chart review pt with many ED admissions for generalized weakness. At time of evaluation, pt presenting with generalized weakness in BUEs and BLEs with decreased activity tolerance to complete functional mobility on unit using RW requiring rest breaks. Pt has been using w/c on unit when completing functional mobility independently and RW when having assist from staff. Pt needing min A to close min guard to complete BADL. At this time recommend pt receive SNF vs HHOT at d/c. Will continue to assess for equipment needs pending dispo.    Follow Up Recommendations  SNF;Home health OT;Supervision - Intermittent;Other (comment)(pending pt progress)    Equipment Recommendations  Other (comment)(TBD)    Recommendations for Other Services       Precautions / Restrictions Precautions Precautions: Fall Precaution Comments: shuffling gait Restrictions Weight Bearing Restrictions: No      Mobility Bed Mobility Overal bed mobility: Needs Assistance Bed Mobility: Supine to  Sit     Supine to sit: Modified independent (Device/Increase time)     General bed mobility comments: increased time  Transfers Overall transfer level: Needs assistance Equipment used: Rolling walker (2 wheeled) Transfers: Sit to/from Stand Sit to Stand: Min guard;Min assist         General transfer comment: mostly close min guard for safety; one instance of min A for transferring to chair with slight LOB from pt    Balance Overall balance assessment: Needs assistance Sitting-balance support: Bilateral upper extremity supported;No upper extremity supported Sitting balance-Leahy Scale: Good     Standing balance support: Bilateral upper extremity supported;During functional activity Standing balance-Leahy Scale: Poor Standing balance comment: reliant on external support                           ADL either performed or assessed with clinical judgement   ADL Overall ADL's : Needs assistance/impaired Eating/Feeding: Set up;Sitting   Grooming: Set up;Sitting   Upper Body Bathing: Sitting;Set up   Lower Body Bathing: Minimal assistance;Sit to/from stand;Sitting/lateral leans   Upper Body Dressing : Set up;Sitting   Lower Body Dressing: Minimal assistance;Sit to/from stand;Sitting/lateral leans   Toilet Transfer: Conservator, museum/gallery Details (indicate cue type and reason): close min guard for safety Toileting- Clothing Manipulation and Hygiene: Set up;Sit to/from stand   Tub/ Shower Transfer: Minimal assistance;Shower seat;Rolling walker;Ambulation   Functional mobility during ADLs: Minimal assistance;Min guard;Rolling walker General ADL Comments: pt requiring close assist for safety, noticing decreased steady gait with shuffling     Vision Baseline Vision/History: No visual deficits Patient Visual Report: No change from baseline       Perception     Praxis  Pertinent Vitals/Pain Pain Assessment: No/denies pain      Hand Dominance     Extremity/Trunk Assessment Upper Extremity Assessment Upper Extremity Assessment: Generalized weakness;LUE deficits/detail;RUE deficits/detail RUE Deficits / Details: grossly 4/5; noted thenar eminence wasting LUE Deficits / Details: grossly 4/5; noted thenar eminence wasting   Lower Extremity Assessment Lower Extremity Assessment: Generalized weakness       Communication Communication Communication: No difficulties   Cognition Arousal/Alertness: Awake/alert Behavior During Therapy: Flat affect Overall Cognitive Status: Within Functional Limits for tasks assessed                                 General Comments: flat, mask like affect   General Comments       Exercises     Shoulder Instructions      Home Living Family/patient expects to be discharged to:: Private residence Living Arrangements: Alone Available Help at Discharge: Other (Comment)(pt reports no one is available to help)   Home Access: Level entry     Home Layout: One level     Bathroom Shower/Tub: Tub/shower unit         Home Equipment: Cane - single point   Additional Comments: reports having cane but does not use unless leaving apt      Prior Functioning/Environment Level of Independence: Independent with assistive device(s)        Comments: reports having cane but does not use unless leaving apt        OT Problem List: Decreased strength;Decreased activity tolerance;Decreased knowledge of use of DME or AE;Impaired balance (sitting and/or standing);Decreased coordination      OT Treatment/Interventions: Self-care/ADL training;DME and/or AE instruction;Therapeutic activities;Balance training;Therapeutic exercise;Energy conservation;Patient/family education    OT Goals(Current goals can be found in the care plan section) Acute Rehab OT Goals Patient Stated Goal: to be able to walk normal again OT Goal Formulation: With patient Time For Goal  Achievement: 10/11/18 Potential to Achieve Goals: Good  OT Frequency: Min 2X/week   Barriers to D/C: Decreased caregiver support  lives alone       Co-evaluation              AM-PAC OT "6 Clicks" Daily Activity     Outcome Measure Help from another person eating meals?: None Help from another person taking care of personal grooming?: None Help from another person toileting, which includes using toliet, bedpan, or urinal?: A Little Help from another person bathing (including washing, rinsing, drying)?: A Little Help from another person to put on and taking off regular upper body clothing?: None Help from another person to put on and taking off regular lower body clothing?: A Little 6 Click Score: 21   End of Session Equipment Utilized During Treatment: Gait belt;Rolling walker Nurse Communication: Mobility status  Activity Tolerance: Patient tolerated treatment well Patient left: in bed;with call bell/phone within reach  OT Visit Diagnosis: Unsteadiness on feet (R26.81);Other abnormalities of gait and mobility (R26.89);Muscle weakness (generalized) (M62.81)                Time: [id number] OT Time Calculation (min): 20 min Charges:  OT General Charges $OT Visit: 1 Visit OT Evaluation $OT Eval Low Complexity: 1 Low  Dalphine HandingKaylee Garnell Begeman, MSOT, OTR/L Behavioral Health OT/ Acute Relief OT PHP Office: [phone]  Dalphine HandingKaylee Latarshia Jersey 09/27/2018, 10:44 AM

## 2018-09-27 NOTE — Progress Notes (Signed)
[facility] MD Progress Note  09/27/2018 12:28 PM Gary [name]  MRN:  [id number] Subjective:  53 year old male, lives alone, on disability. Followed by ACT team. Presented to ED on 5/1 due to anxiety, depression, low energy and other neuro-vegetative symptoms, passive SI. History of Mood Disorder, reports he has been diagnosed with Bipolar Disorder in the past . Attributes depression to social isolation, limited support network and to difficulties with ambulation secondary to muscular weakness and history of R hip surgery.  Objective: Patient is seen and examined.  Patient is a 53 year old male with the above-stated past psychiatric history is seen in follow-up.  He stated he feels a little bit less depressed today.  He still focused on the fact that his ambulation is limited.  He stated his feet shuffle, and he was seen by Occupational Therapy as well as physical therapy today.  There was some concern of akathisia versus Parkinson's disease.  On physical examination today he had no cogwheeling.  He denied any suicidal ideation today.  He still remains in bed a great deal, and is really not doing a great deal to help his own ambulation issues.  He is followed by ACTT services through [place].  He has a reported history of bipolar disorder 2.  He denied any side effects to his current medications.  His vital signs are stable, he is afebrile.  He slept 6.75 hours last night.  Review of his laboratories showed elevated triglycerides at 306, otherwise normal CBC and metabolic panel.  Principal Problem: MDD (major depressive disorder) Diagnosis: Principal Problem:   MDD (major depressive disorder)  Total Time spent with patient: 20 minutes  Past Psychiatric History: See admission H&P  Past Medical History:  Past Medical History:  Diagnosis Date  . Anxiety   . Bipolar 2 disorder (HCC)   . Depression   . GERD (gastroesophageal reflux disease)   . Hyperhydrosis disorder     Past Surgical History:  Procedure  Laterality Date  . HERNIA REPAIR    . HIP SURGERY Right   . LEFT HEART CATH AND CORONARY ANGIOGRAPHY N/A 09/19/2018   Procedure: LEFT HEART CATH AND CORONARY ANGIOGRAPHY;  Surgeon: [name], MD;  Location: [facility] INVASIVE CV LAB;  Service: Cardiovascular;  Laterality: N/A;   Family History: History reviewed. No pertinent family history. Family Psychiatric  History: See admission H&P Social History:  Social History   Substance and Sexual Activity  Alcohol Use No     Social History   Substance and Sexual Activity  Drug Use No    Social History   Socioeconomic History  . Marital status: Single    Spouse name: Not on file  . Number of children: Not on file  . Years of education: Not on file  . Highest education level: Not on file  Occupational History  . Not on file  Social Needs  . Financial resource strain: Not on file  . Food insecurity:    Worry: Not on file    Inability: Not on file  . Transportation needs:    Medical: Not on file    Non-medical: Not on file  Tobacco Use  . Smoking status: Never Smoker  . Smokeless tobacco: Never Used  Substance and Sexual Activity  . Alcohol use: No  . Drug use: No  . Sexual activity: Not on file  Lifestyle  . Physical activity:    Days per week: Not on file    Minutes per session: Not on file  .  Stress: Not on file  Relationships  . Social connections:    Talks on phone: Not on file    Gets together: Not on file    Attends religious service: Not on file    Active member of club or organization: Not on file    Attends meetings of clubs or organizations: Not on file    Relationship status: Not on file  Other Topics Concern  . Not on file  Social History Narrative  . Not on file   Additional Social History:                         Sleep: Good  Appetite:  Fair  Current Medications: Current Facility-Administered Medications  Medication Dose Route Frequency Provider Last Rate Last Dose  .  acetaminophen (TYLENOL) tablet 650 mg  650 mg Oral Q6H PRN Denzil Magnuson, NP      . alum & mag hydroxide-simeth (MAALOX/MYLANTA) 200-200-20 MG/5ML suspension 30 mL  30 mL Oral Q4H PRN Denzil Magnuson, NP      . benztropine (COGENTIN) tablet 0.5 mg  0.5 mg Oral BID Antonieta Pert, MD      . Melene Muller ON 09/28/2018] escitalopram (LEXAPRO) tablet 20 mg  20 mg Oral Daily Antonieta Pert, MD      . famotidine (PEPCID) tablet 20 mg  20 mg Oral Daily Cobos, Rockey Situ, MD   20 mg at 09/27/18 0853  . gabapentin (NEURONTIN) capsule 400 mg  400 mg Oral TID Antonieta Pert, MD   400 mg at 09/27/18 1150  . magnesium hydroxide (MILK OF MAGNESIA) suspension 30 mL  30 mL Oral Daily PRN Denzil Magnuson, NP      . OLANZapine [facility]) tablet 5 mg  5 mg Oral QHS Antonieta Pert, MD   5 mg at 09/26/18 2112  . tamsulosin (FLOMAX) capsule 0.4 mg  0.4 mg Oral Daily Cobos, Rockey Situ, MD   0.4 mg at 09/27/18 1655    Lab Results:  Results for orders placed or performed during the hospital encounter of 09/25/18 (from the past 48 hour(s))  Basic metabolic panel     Status: Abnormal   Collection Time: 09/26/18  5:55 PM  Result Value Ref Range   Sodium 138 135 - 145 mmol/L   Potassium 4.0 3.5 - 5.1 mmol/L   Chloride 104 98 - 111 mmol/L   CO2 29 22 - 32 mmol/L   Glucose, Bld 103 (H) 70 - 99 mg/dL   BUN 20 6 - 20 mg/dL   Creatinine, Ser 3.74 0.61 - 1.24 mg/dL   Calcium 8.9 8.9 - 82.7 mg/dL   GFR calc non Af Amer >60 >60 mL/min   GFR calc Af Amer >60 >60 mL/min   Anion gap 5 5 - 15    Comment: Performed at [facility], [address] [place], [place] [address]  CK     Status: None   Collection Time: 09/26/18  5:55 PM  Result Value Ref Range   Total CK 51 49 - 397 U/L    Comment: Performed at [facility], [address] [place], [place] [address]  Hemoglobin A1c     Status: None   Collection Time: 09/26/18  5:55 PM  Result Value Ref Range   Hgb A1c MFr Bld  5.4 4.8 - 5.6 %    Comment: (NOTE) Pre diabetes:          5.7%-6.4% Diabetes:              >  6.4% Glycemic control for   <7.0% adults with diabetes    Mean Plasma Glucose 108.28 mg/dL    Comment: Performed at [facility], [address], [place], [place] [address]  Lipid panel     Status: Abnormal   Collection Time: 09/26/18  5:55 PM  Result Value Ref Range   Cholesterol 156 0 - 200 mg/dL   Triglycerides 604 (H) <150 mg/dL   HDL 32 (L) >54 mg/dL   Total CHOL/HDL Ratio 4.9 RATIO   VLDL 61 (H) 0 - 40 mg/dL   LDL Cholesterol 63 0 - 99 mg/dL    Comment:        Total Cholesterol/HDL:CHD Risk Coronary Heart Disease Risk Table                     Men   Women  1/2 Average Risk   3.4   3.3  Average Risk       5.0   4.4  2 X Average Risk   9.6   7.1  3 X Average Risk  23.4   11.0        Use the calculated Patient Ratio above and the CHD Risk Table to determine the patient's CHD Risk.        ATP III CLASSIFICATION (LDL):  <100     mg/dL   Optimal  098-119  mg/dL   Near or Above                    Optimal  130-159  mg/dL   Borderline  147-829  mg/dL   High  >562     mg/dL   Very High Performed at [facility], [address] [place], [place] [address]   TSH     Status: None   Collection Time: 09/26/18  5:55 PM  Result Value Ref Range   TSH 2.921 0.350 - 4.500 uIU/mL    Comment: Performed by a 3rd Generation assay with a functional sensitivity of <=0.01 uIU/mL. Performed at [facility], [address] [place], [place] [address]   Vitamin B12     Status: None   Collection Time: 09/26/18  5:55 PM  Result Value Ref Range   Vitamin B-12 267 180 - 914 pg/mL    Comment: (NOTE) This assay is not validated for testing neonatal or myeloproliferative syndrome specimens for Vitamin B12 levels. Performed at [facility], [address] [place], [place] [address]     Blood Alcohol level:  Lab Results  Component  Value Date   [facility] <10 09/22/2018   ETH <10 09/16/2018    Metabolic Disorder Labs: Lab Results  Component Value Date   HGBA1C 5.4 09/26/2018   MPG 108.28 09/26/2018   No results found for: PROLACTIN Lab Results  Component Value Date   CHOL 156 09/26/2018   TRIG 306 (H) 09/26/2018   HDL 32 (L) 09/26/2018   CHOLHDL 4.9 09/26/2018   VLDL 61 (H) 09/26/2018   LDLCALC 63 09/26/2018   LDLCALC 107 (H) 09/19/2018    Physical Findings: AIMS:  , ,  ,  ,    CIWA:    COWS:     Musculoskeletal: Strength & Muscle Tone: decreased Gait & Station: shuffle Patient leans: N/A  Psychiatric Specialty Exam: Physical Exam  Nursing note and vitals reviewed. Constitutional: He is oriented to person, place, and time. He appears well-developed and well-nourished.  HENT:  Head: Normocephalic and atraumatic.  Respiratory: Effort normal.  Neurological: He is alert and oriented to person, place, and time.    ROS  Blood pressure 105/69, pulse 83, temperature 98.2 F (36.8 C), temperature source Oral, resp. rate 16, height 5\' 1"  (1.549 m), weight 55.8 kg, SpO2 95 %.Body mass index is 23.24 kg/m.  General Appearance: Casual  Eye Contact:  Fair  Speech:  Normal Rate  Volume:  Decreased  Mood:  Depressed  Affect:  Flat  Thought Process:  Coherent and Descriptions of Associations: Circumstantial  Orientation:  Full (Time, Place, and Person)  Thought Content:  Logical  Suicidal Thoughts:  No  Homicidal Thoughts:  No  Memory:  Immediate;   Fair Recent;   Fair Remote;   Fair  Judgement:  Intact  Insight:  Lacking  Psychomotor Activity:  Psychomotor Retardation  Concentration:  Concentration: Fair and Attention Span: Fair  Recall:  F[employer]Fair  Fund of Knowledge:  Fair  Language:  Fair  Akathisia:  Negative  Handed:  Right  AIMS (if indicated):     Assets:  Desire for Improvement Resilience  ADL's:  Impaired  Cognition:  WNL  Sleep:  Number of Hours: 6.75     Treatment Plan Summary: Daily  contact with patient to assess and evaluate symptoms and progress in treatment, Medication management and Plan : Patient is seen and examined.  Patient is a 53 year old male with the above-stated past psychiatric history who is seen in follow-up.   Diagnosis: #1 bipolar disorder; depressed versus major depressive disorder, recurrent, severe, #2 coronary artery disease, #3 ataxia, #4 BPH, #5 chronic pain, #6 EPS (?)  Patient stated his mood is a bit better today.  He continues on Lexapro 10 mg p.o. daily.  I am going to increase his Lexapro to 20 mg p.o. daily.  We will monitor for any mania that may occur secondary to the increase of this medication.  No change in his olanzapine or gabapentin at this point.  He has a reported shuffling gait, and on physical examination today he does not have any cogwheeling.  There is some possible concern for Parkinson's disease versus EPS.  I will start Cogentin 0.5 mg p.o. twice daily and see if that helps with anything to do with his ambulation.  A lot of this has to do with his own lack of motivation, and it is unclear whether or not that is secondary to his depression or just because he has not really been able to do any kind of significant physical therapy with his hip.  Review of the electronic medical record shows that he has already been in a rehab facility, and apparently was not beneficial towards his ambulation.  We will attempt to follow any recommendations per occupational therapy or physical therapy. 1.  Start Cogentin 0.5 mg p.o. twice daily for possible EPS. 2.  Increase Lexapro to 20 mg p.o. daily for depression. 3.  Continue Pepcid 20 mg p.o. daily for GERD. 4.  Continue gabapentin 400 mg p.o. 3 times daily for chronic pain and mood stability. 5.  Continue Zyprexa 5 mg p.o. nightly for mood stability. 6.  Continue Flomax 0.4 mg p.o. daily for BPH 7.  Continue recommendations per occupational therapy as well as physical therapy. 8.  Disposition  planning-in progress.    Antonieta PertGreg Lawson Clary, MD 09/27/2018, 12:28 PM

## 2018-09-28 ENCOUNTER — Inpatient Hospital Stay: Payer: Medicaid Other | Admitting: Primary Care

## 2018-09-28 LAB — VITAMIN D 25 HYDROXY (VIT D DEFICIENCY, FRACTURES): Vit D, 25-Hydroxy: 41.8 ng/mL (ref 30.0–100.0)

## 2018-09-28 NOTE — Plan of Care (Signed)
Progress note  D: pt found in bed; compliant with medication administration. Pt is ambulating with his walker. Pt denies any physical pain or symptoms, but is very blank, sullen, isolative, and minimal in his demeanor and affect. Pt had a consult with the PT and they recommended trying to find the patient a fitting for an orthopedic shoe. Pt was found to have a shorter lower limb on the side where his hip surgery was. Pt has been in his room most of the day. Pt safe on the unit. Pt provided support and encouragement. Pt given medication per protocol and standing orders. Q25m safety checks implemented and continued. Will continue to monitor.  Problem: Education: Goal: Knowledge of [place] [facility] Education information/materials will improve Outcome: Progressing Goal: Verbalization of understanding the information provided will improve Outcome: Progressing   Problem: Activity: Goal: Imbalance in normal sleep/wake cycle will improve Outcome: Progressing   Problem: Coping: Goal: Ability to identify and develop effective coping behavior will improve Outcome: Progressing

## 2018-09-28 NOTE — Plan of Care (Signed)
D: Patient is alert and cooperative. Denies SI, HI, AVH, and verbally contracts for safety. Patient reports feeling more depressed since being here. Patient denies physical symptoms/pain.    A: Medications administered per MD order. Support provided. Patient educated on safety on the unit and medications. Routine safety checks every 15 minutes. Patient stated understanding to tell nurse about any new physical symptoms. Patient understands to tell staff of any needs.     R: No adverse drug reactions noted. Patient verbally contracts for safety. Patient remains safe at this time and will continue to monitor.   Problem: Medication: Goal: Compliance with prescribed medication regimen will improve Outcome: Progressing   Patient is willing to take medication as prescribed. Patient remains safe and will continue to monitor.   [place] NOVEL CORONAVIRUS (COVID-19) DAILY CHECK-OFF SYMPTOMS - answer yes or no to each - every day NO YES  Have you had a fever in the past 24 hours?  Fever (Temp > 37.80C / 100F) X   Have you had any of these symptoms in the past 24 hours? New Cough  Sore Throat   Shortness of Breath  Difficulty Breathing  Unexplained Body Aches   X   Have you had any one of these symptoms in the past 24 hours not related to allergies?   Runny Nose  Nasal Congestion  Sneezing   X   If you have had runny nose, nasal congestion, sneezing in the past 24 hours, has it worsened?  X   EXPOSURES - check yes or no X   Have you traveled outside the state in the past 14 days?  X   Have you been in contact with someone with a confirmed diagnosis of COVID-19 or PUI in the past 14 days without wearing appropriate PPE?  X   Have you been living in the same home as a person with confirmed diagnosis of COVID-19 or a PUI (household contact)?    X   Have you been diagnosed with COVID-19?    X              What to do next: Answered NO to all: Answered YES to anything:   Proceed with unit  schedule Follow the [facility] Inpatient Flowsheet.

## 2018-09-28 NOTE — Evaluation (Signed)
Physical Therapy Evaluation Patient Details Name: Gary MariscalKevin G [name] MRN: [id number] DOB: 02/16/1966 Today's Date: 09/28/2018   History of Present Illness  53 year old male, lives alone, on disability.Had initially presented ED initially on 4/25, reporting anxiety, depression, low energy, suicidal ideations which were characterized as passive. He was evaluated by Cardiology consultant and recommended for Heart Catheterization due to significant exertional dyspnea and elevated troponin on admission. Cath was negative for acute findings. Pt also reports a difficulty with generalized weakness and functional mobility   Clinical Impression  Pt admitted as above and presenting with functional mobility limitations 2* generalized weakness and balance deficits 2* significant gait abnormality which appears related to significant tightness bilat hamstrings and hip flexors, noted leg length discrepancy with R LE being shorter; and ingrained habits and compensations following THR over one year ago (pt reports minimal PT/OT follow up "I sat for a month in a WC before they really did anything".  This session focused on postural correction, increasing and equalizing step length, and initiating stretching program.  Pt would benefit from shoe wedge on R to equalize leg length and allow easier step through with L.    Follow Up Recommendations Home health PT    Equipment Recommendations  Rolling walker with 5" wheels    Recommendations for Other Services       Precautions / Restrictions Precautions Precautions: Fall Precaution Comments: shuffling gait Restrictions Weight Bearing Restrictions: No      Mobility  Bed Mobility Overal bed mobility: Modified Independent Bed Mobility: Supine to Sit;Sit to Supine     Supine to sit: Modified independent (Device/Increase time) Sit to supine: Modified independent (Device/Increase time)      Transfers Overall transfer level: Needs assistance Equipment used:  Rolling walker (2 wheeled) Transfers: Sit to/from Stand Sit to Stand: Min guard;Min assist         General transfer comment: cues for transition position; min guard for balance on initial standing  Ambulation/Gait Ambulation/Gait assistance: Min assist;Min guard Gait Distance (Feet): 800 Feet Assistive device: Rolling walker (2 wheeled);1 person hand held assist(or utilizing rail in hall) Gait Pattern/deviations: Step-to pattern;Step-through pattern;Decreased step length - right;Decreased step length - left;Shuffle;Trunk flexed;Narrow base of support Gait velocity: decr   General Gait Details: Pt flexed at hips and knees and ambulating with short stutter steps progressing to festination type gait with pt attempts to increase speed.   With multimodal cues, pt able to improve posture, increase stride length, and step through with L LE but continually reverting back to short stutter stepping..  Noted improvement in ability to step through with L foot when R leg length descrepancy decreased with placement of shoe on R foot only..  Stairs            Wheelchair Mobility    Modified Rankin (Stroke Patients Only)       Balance Overall balance assessment: Needs assistance Sitting-balance support: No upper extremity supported;Feet supported Sitting balance-Leahy Scale: Good     Standing balance support: No upper extremity supported Standing balance-Leahy Scale: Fair                               Pertinent Vitals/Pain Pain Assessment: No/denies pain    Home Living Family/patient expects to be discharged to:: Private residence Living Arrangements: Alone   Type of Home: Apartment Home Access: Level entry     Home Layout: One level Home Equipment: Cane - single point Additional Comments:  reports having cane but does not use unless leaving apt    Prior Function Level of Independence: Independent with assistive device(s)         Comments: reports having  cane but does not use unless leaving apt     Hand Dominance        Extremity/Trunk Assessment   Upper Extremity Assessment Upper Extremity Assessment: Generalized weakness;Defer to OT evaluation    Lower Extremity Assessment Lower Extremity Assessment: Generalized weakness;RLE deficits/detail;LLE deficits/detail RLE Deficits / Details: Noted tight hip flexors and hamstrings; also noted R LE with significant leg length descrepancy vs L LLE Deficits / Details: as R LE    Cervical / Trunk Assessment Cervical / Trunk Assessment: Other exceptions Cervical / Trunk Exceptions: flexed posture  Communication   Communication: No difficulties  Cognition Arousal/Alertness: Awake/alert Behavior During Therapy: Flat affect Overall Cognitive Status: Within Functional Limits for tasks assessed                                 General Comments: flat, mask like affect      General Comments      Exercises Other Exercises Other Exercises: hip flexor stretching with legs hanging over EOB Other Exercises: Hamstring stretching in long sitting.   Assessment/Plan    PT Assessment Patient needs continued PT services  PT Problem List Decreased strength;Decreased range of motion;Decreased activity tolerance;Decreased balance;Decreased mobility;Decreased knowledge of use of DME       PT Treatment Interventions DME instruction;Gait training;Stair training;Functional mobility training;Therapeutic activities;Therapeutic exercise;Balance training    PT Goals (Current goals can be found in the Care Plan section)  Acute Rehab PT Goals Patient Stated Goal: to be able to walk normal again PT Goal Formulation: With patient Time For Goal Achievement: 10/05/18 Potential to Achieve Goals: Good    Frequency Min 2X/week   Barriers to discharge Decreased caregiver support Pt lives alone    Co-evaluation               AM-PAC PT "6 Clicks" Mobility  Outcome Measure Help needed  turning from your back to your side while in a flat bed without using bedrails?: None Help needed moving from lying on your back to sitting on the side of a flat bed without using bedrails?: None Help needed moving to and from a bed to a chair (including a wheelchair)?: A Little Help needed standing up from a chair using your arms (e.g., wheelchair or bedside chair)?: A Little Help needed to walk in hospital room?: A Little Help needed climbing 3-5 steps with a railing? : A Lot 6 Click Score: 19    End of Session Equipment Utilized During Treatment: Gait belt Activity Tolerance: Patient tolerated treatment well Patient left: in bed;with call bell/phone within reach Nurse Communication: Mobility status PT Visit Diagnosis: Difficulty in walking, not elsewhere classified (R26.2);Muscle weakness (generalized) (M62.81)    Time: 1440-1520 PT Time Calculation (min) (ACUTE ONLY): 40 min   Charges:   PT Evaluation $PT Eval Low Complexity: 1 Low PT Treatments $Gait Training: 8-22 mins $Therapeutic Exercise: 8-22 mins        Mauro Kaufmann PT Acute Rehabilitation Services Pager [phone] Office [phone]   Keelia Graybill 09/28/2018, 5:51 PM

## 2018-09-28 NOTE — Progress Notes (Signed)
[facility] MD Progress Note  09/28/2018 10:44 AM APOLINAR GUIRGUIS  MRN:  [id number] Subjective:  "I'm fair."  Mr. Casa found resting in bed. He reports mood continues to improve. Denies SI. He continues to remain in bed throughout the day. He was encouraged to go to dayroom and participate in group therapy for both mood and ambulation. Patient was evaluated by OT yesterday and did well ambulating with walker. This was reviewed with patient. He agrees to use walker on the unit rather than wheelchair. Patient does report he ambulates without assistance at home. Lexapro was increased yesterday and Cogentin started. Patient denies medication side effects. He is followed by an ACT team at home. CSW is working with patient's PCP to establish OT/PT services on discharge.   Principal Problem: MDD (major depressive disorder) Diagnosis: Principal Problem:   MDD (major depressive disorder)  Total Time spent with patient: 15 minutes  Past Psychiatric History: See admission H&P  Past Medical History:  Past Medical History:  Diagnosis Date  . Anxiety   . Bipolar 2 disorder (HCC)   . Depression   . GERD (gastroesophageal reflux disease)   . Hyperhydrosis disorder     Past Surgical History:  Procedure Laterality Date  . HERNIA REPAIR    . HIP SURGERY Right   . LEFT HEART CATH AND CORONARY ANGIOGRAPHY N/A 09/19/2018   Procedure: LEFT HEART CATH AND CORONARY ANGIOGRAPHY;  Surgeon: [name], MD;  Location: [facility] INVASIVE CV LAB;  Service: Cardiovascular;  Laterality: N/A;   Family History: History reviewed. No pertinent family history. Family Psychiatric  History: See admission H&P Social History:  Social History   Substance and Sexual Activity  Alcohol Use No     Social History   Substance and Sexual Activity  Drug Use No    Social History   Socioeconomic History  . Marital status: Single    Spouse name: Not on file  . Number of children: Not on file  . Years of education: Not on file   . Highest education level: Not on file  Occupational History  . Not on file  Social Needs  . Financial resource strain: Not on file  . Food insecurity:    Worry: Not on file    Inability: Not on file  . Transportation needs:    Medical: Not on file    Non-medical: Not on file  Tobacco Use  . Smoking status: Never Smoker  . Smokeless tobacco: Never Used  Substance and Sexual Activity  . Alcohol use: No  . Drug use: No  . Sexual activity: Not on file  Lifestyle  . Physical activity:    Days per week: Not on file    Minutes per session: Not on file  . Stress: Not on file  Relationships  . Social connections:    Talks on phone: Not on file    Gets together: Not on file    Attends religious service: Not on file    Active member of club or organization: Not on file    Attends meetings of clubs or organizations: Not on file    Relationship status: Not on file  Other Topics Concern  . Not on file  Social History Narrative  . Not on file   Additional Social History:                         Sleep: Good  Appetite:  Fair  Current Medications: Current Facility-Administered Medications  Medication Dose Route Frequency Provider Last Rate Last Dose  . acetaminophen (TYLENOL) tablet 650 mg  650 mg Oral Q6H PRN Denzil Magnuson, NP      . alum & mag hydroxide-simeth (MAALOX/MYLANTA) 200-200-20 MG/5ML suspension 30 mL  30 mL Oral Q4H PRN Denzil Magnuson, NP      . benztropine (COGENTIN) tablet 0.5 mg  0.5 mg Oral BID Antonieta Pert, MD   0.5 mg at 09/28/18 0803  . escitalopram (LEXAPRO) tablet 20 mg  20 mg Oral Daily Antonieta Pert, MD   20 mg at 09/28/18 0803  . famotidine (PEPCID) tablet 20 mg  20 mg Oral Daily Cobos, Rockey Situ, MD   20 mg at 09/28/18 0803  . gabapentin (NEURONTIN) capsule 400 mg  400 mg Oral TID Antonieta Pert, MD   400 mg at 09/28/18 0803  . magnesium hydroxide (MILK OF MAGNESIA) suspension 30 mL  30 mL Oral Daily PRN Denzil Magnuson,  NP      . OLANZapine (ZYPREXA) tablet 5 mg  5 mg Oral QHS Antonieta Pert, MD   5 mg at 09/27/18 2144  . tamsulosin (FLOMAX) capsule 0.4 mg  0.4 mg Oral Daily Cobos, Rockey Situ, MD   0.4 mg at 09/28/18 0803    Lab Results:  Results for orders placed or performed during the hospital encounter of 09/25/18 (from the past 48 hour(s))  Basic metabolic panel     Status: Abnormal   Collection Time: 09/26/18  5:55 PM  Result Value Ref Range   Sodium 138 135 - 145 mmol/L   Potassium 4.0 3.5 - 5.1 mmol/L   Chloride 104 98 - 111 mmol/L   CO2 29 22 - 32 mmol/L   Glucose, Bld 103 (H) 70 - 99 mg/dL   BUN 20 6 - 20 mg/dL   Creatinine, Ser 1.61 0.61 - 1.24 mg/dL   Calcium 8.9 8.9 - 09.6 mg/dL   GFR calc non Af Amer >60 >60 mL/min   GFR calc Af Amer >60 >60 mL/min   Anion gap 5 5 - 15    Comment: Performed at [facility], [address], [place], [place] [address]  CK     Status: None   Collection Time: 09/26/18  5:55 PM  Result Value Ref Range   Total CK 51 49 - 397 U/L    Comment: Performed at [facility], [address] [place], [place] [address]  Hemoglobin A1c     Status: None   Collection Time: 09/26/18  5:55 PM  Result Value Ref Range   Hgb A1c MFr Bld 5.4 4.8 - 5.6 %    Comment: (NOTE) Pre diabetes:          5.7%-6.4% Diabetes:              >6.4% Glycemic control for   <7.0% adults with diabetes    Mean Plasma Glucose 108.28 mg/dL    Comment: Performed at [facility], [address], [place], [place] [address]  Lipid panel     Status: Abnormal   Collection Time: 09/26/18  5:55 PM  Result Value Ref Range   Cholesterol 156 0 - 200 mg/dL   Triglycerides 956 (H) <150 mg/dL   HDL 32 (L) >21 mg/dL   Total CHOL/HDL Ratio 4.9 RATIO   VLDL 61 (H) 0 - 40 mg/dL   LDL Cholesterol 63 0 - 99 mg/dL    Comment:        Total  Cholesterol/HDL:CHD Risk Coronary Heart Disease Risk Table                     Men   Women  1/2 Average Risk   3.4    3.3  Average Risk       5.0   4.4  2 X Average Risk   9.6   7.1  3 X Average Risk  23.4   11.0        Use the calculated Patient Ratio above and the CHD Risk Table to determine the patient's CHD Risk.        ATP III CLASSIFICATION (LDL):  <100     mg/dL   Optimal  696-295  mg/dL   Near or Above                    Optimal  130-159  mg/dL   Borderline  284-132  mg/dL   High  >440     mg/dL   Very High Performed at [facility], [address] [place], [place] [address]   TSH     Status: None   Collection Time: 09/26/18  5:55 PM  Result Value Ref Range   TSH 2.921 0.350 - 4.500 uIU/mL    Comment: Performed by a 3rd Generation assay with a functional sensitivity of <=0.01 uIU/mL. Performed at [facility], [address], [place], [place] [address]   Vitamin B12     Status: None   Collection Time: 09/26/18  5:55 PM  Result Value Ref Range   Vitamin B-12 267 180 - 914 pg/mL    Comment: (NOTE) This assay is not validated for testing neonatal or myeloproliferative syndrome specimens for Vitamin B12 levels. Performed at [facility], [address] [place], [place] [address]   VITAMIN D 25 Hydroxy (Vit-D Deficiency, Fractures)     Status: None   Collection Time: 09/26/18  5:55 PM  Result Value Ref Range   Vit D, 25-Hydroxy 41.8 30.0 - 100.0 ng/mL    Comment: (NOTE) Vitamin D deficiency has been defined by the Institute of Medicine and an Endocrine Society practice guideline as a level of serum 25-OH vitamin D less than 20 ng/mL (1,2). The Endocrine Society went on to further define vitamin D insufficiency as a level between 21 and 29 ng/mL (2). 1. IOM (Institute of Medicine). 2010. Dietary reference   intakes for calcium and D. [place]: The   [employer]. 2. Holick MF, Binkley [place], Bischoff-Ferrari HA, et al.   Evaluation, treatment, and prevention of vitamin D   deficiency: an Endocrine Society  clinical practice   guideline. JCEM. 2011 Jul; 96(7):1911-30. Performed At: [facility] [address] [place], [place] [id number] Jolene Schimke MD FI:[id number]     Blood Alcohol level:  Lab Results  Component Value Date   [facility] <10 09/22/2018   ETH <10 09/16/2018    Metabolic Disorder Labs: Lab Results  Component Value Date   HGBA1C 5.4 09/26/2018   MPG 108.28 09/26/2018   No results found for: PROLACTIN Lab Results  Component Value Date   CHOL 156 09/26/2018   TRIG 306 (H) 09/26/2018   HDL 32 (L) 09/26/2018   CHOLHDL 4.9 09/26/2018   VLDL 61 (H) 09/26/2018   LDLCALC 63 09/26/2018   LDLCALC 107 (H) 09/19/2018    Physical Findings: AIMS:  , ,  ,  ,    CIWA:    COWS:  Musculoskeletal: Strength & Muscle Tone: decreased Gait & Station: ambulates well with walker Patient leans: N/A  Psychiatric Specialty Exam: Physical Exam  Nursing note and vitals reviewed. Constitutional: He is oriented to person, place, and time. He appears well-developed and well-nourished.  Cardiovascular: Normal rate.  Respiratory: Effort normal.  Neurological: He is alert and oriented to person, place, and time.    Review of Systems  Constitutional: Negative.   Psychiatric/Behavioral: Positive for depression. Negative for hallucinations, substance abuse and suicidal ideas. The patient is not nervous/anxious and does not have insomnia.     Blood pressure 122/68, pulse 65, temperature 97.8 F (36.6 C), temperature source Oral, resp. rate 20, height 5\' 1"  (1.549 m), weight 55.8 kg, SpO2 95 %.Body mass index is 23.24 kg/m.  General Appearance: Casual  Eye Contact:  Good  Speech:  Normal Rate  Volume:  Decreased  Mood:  Depressed  Affect:  Flat  Thought Process:  Coherent  Orientation:  Full (Time, Place, and Person)  Thought Content:  WDL  Suicidal Thoughts:  No  Homicidal Thoughts:  No  Memory:  Immediate;   Fair Recent;   Good  Judgement:  Intact  Insight:  Shallow   Psychomotor Activity:  Decreased  Concentration:  Concentration: Fair and Attention Span: Fair  Recall:  FiservFair  Fund of Knowledge:  Fair  Language:  Fair  Akathisia:  No  Handed:  Right  AIMS (if indicated):     Assets:  ArchitectCommunication Skills Financial Resources/Insurance Housing Leisure Time  ADL's:  Intact  Cognition:  WNL  Sleep:  Number of Hours: 6.75     Treatment Plan Summary: Daily contact with patient to assess and evaluate symptoms and progress in treatment and Medication management   Continue inpatient hospitalization.  Continue Lexapro 20 mg PO daily for mood Continue Zyprexa 5 mg PO QHS for mood Continue Cogentin 0.5 mg PO BID for EPS Continue gabapentin 400 mg PO TID for pain/anxiety Continue Flomax 0.4 mg PO daily for BPH Continue Pepcid 20 mg PO daily for GERD  Patient will participate in the therapeutic group milieu.  Discharge disposition in progress.   Aldean BakerJanet E Malak Orantes, NP 09/28/2018, 10:44 AM

## 2018-09-29 MED ORDER — OLANZAPINE 5 MG PO TABS: 5.0000 mg | ORAL_TABLET | Freq: Every day | ORAL | 0 refills | Status: AC

## 2018-09-29 MED ORDER — GABAPENTIN 400 MG PO CAPS: 400.0000 mg | ORAL_CAPSULE | Freq: Three times a day (TID) | ORAL | 0 refills | Status: DC

## 2018-09-29 MED ORDER — BENZTROPINE MESYLATE 0.5 MG PO TABS: 0.5000 mg | ORAL_TABLET | Freq: Two times a day (BID) | ORAL | 0 refills | Status: DC

## 2018-09-29 MED ORDER — FAMOTIDINE 20 MG PO TABS: 20.0000 mg | ORAL_TABLET | Freq: Every day | ORAL | 0 refills | Status: DC

## 2018-09-29 MED ORDER — ESCITALOPRAM OXALATE 20 MG PO TABS: 20.0000 mg | ORAL_TABLET | Freq: Every day | ORAL | 0 refills | Status: DC

## 2018-09-29 NOTE — Plan of Care (Signed)
Discharge note  Patient verbalizes readiness for discharge. Follow up plan explained, AVS, Transition record and SRA given. Prescriptions and teaching provided. Belongings returned and signed for. Suicide safety plan completed and signed. Patient verbalizes understanding. Patient denies SI/HI and assures this Clinical research associate he will seek assistance should that change. Patient discharged to lobby with taxi voucher.  Problem: Education: Goal: Knowledge of [facility] Education information/materials will improve Outcome: Adequate for Discharge Goal: Emotional status will improve Outcome: Adequate for Discharge Goal: Mental status will improve Outcome: Adequate for Discharge Goal: Verbalization of understanding the information provided will improve Outcome: Adequate for Discharge   Problem: Activity: Goal: Interest or engagement in leisure activities will improve Outcome: Adequate for Discharge Goal: Imbalance in normal sleep/wake cycle will improve Outcome: Adequate for Discharge   Problem: Coping: Goal: Ability to identify and develop effective coping behavior will improve Outcome: Adequate for Discharge   Problem: Medication: Goal: Compliance with prescribed medication regimen will improve Outcome: Adequate for Discharge   Problem: Self-Concept: Goal: Ability to disclose and discuss suicidal ideas will improve Outcome: Adequate for Discharge Goal: Will verbalize positive feelings about self Outcome: Adequate for Discharge

## 2018-09-29 NOTE — Discharge Summary (Signed)
Physician Discharge Summary Note  Patient:  Gary [name] is an 53 y.o., male MRN:  [id number] DOB:  Mar 22, 1966 Patient phone:  [phone] (home)  Patient address:   [address] [facility] [address],  Total Time spent with patient: 15 minutes  Date of Admission:  09/25/2018 Date of Discharge: 09/29/18  Reason for Admission:  suicidal ideation  Principal Problem: MDD (major depressive disorder) Discharge Diagnoses: Principal Problem:   MDD (major depressive disorder)   Past Psychiatric History: Per admission H&P: patient reports he has been diagnosed with Bipolar Disorder, but reports mainly depression and does not endorse clear history of manic episodes. Does endorse depression, which he describes as chronic. Denies history of suicidal attempts or of self injurious behaviors. Denies history of psychosis. Denies history of PTSD. Reports one prior psychiatric admission in 2017, for depression.  Past Medical History:  Past Medical History:  Diagnosis Date  . Anxiety   . Bipolar 2 disorder (HCC)   . Depression   . GERD (gastroesophageal reflux disease)   . Hyperhydrosis disorder     Past Surgical History:  Procedure Laterality Date  . HERNIA REPAIR    . HIP SURGERY Right   . LEFT HEART CATH AND CORONARY ANGIOGRAPHY N/A 09/19/2018   Procedure: LEFT HEART CATH AND CORONARY ANGIOGRAPHY;  Surgeon: [name], MD;  Location: [facility] INVASIVE CV LAB;  Service: Cardiovascular;  Laterality: N/A;   Family History: History reviewed. No pertinent family history. Family Psychiatric  History: Per admission H&P: denies mental illness or suicides in family. Father was alcoholic  Social History:  Social History   Substance and Sexual Activity  Alcohol Use No     Social History   Substance and Sexual Activity  Drug Use No    Social History   Socioeconomic History  . Marital status: Single    Spouse name: Not on file  . Number of children: Not on file  . Years of  education: Not on file  . Highest education level: Not on file  Occupational History  . Not on file  Social Needs  . Financial resource strain: Not on file  . Food insecurity:    Worry: Not on file    Inability: Not on file  . Transportation needs:    Medical: Not on file    Non-medical: Not on file  Tobacco Use  . Smoking status: Never Smoker  . Smokeless tobacco: Never Used  Substance and Sexual Activity  . Alcohol use: No  . Drug use: No  . Sexual activity: Not on file  Lifestyle  . Physical activity:    Days per week: Not on file    Minutes per session: Not on file  . Stress: Not on file  Relationships  . Social connections:    Talks on phone: Not on file    Gets together: Not on file    Attends religious service: Not on file    Active member of club or organization: Not on file    Attends meetings of clubs or organizations: Not on file    Relationship status: Not on file  Other Topics Concern  . Not on file  Social History Narrative  . Not on file    Hospital Course:  From admission H&P: 53 year old male, lives alone, on disability. Had initially presented ED initially on 4/25, reporting anxiety, depression, low energy, suicidal ideations which were characterized as passive. He was evaluated by Cardiology consultant and recommended for Heart  Catheterization due to significant exertional dyspnea and elevated troponin on admission. Cath was negative for acute findings. Was discharged on 4/28. Returned to ED on 5/1 under IVC, due to suicidal ideations . He also reported generalized weakness, difficulty with ambulation. He has been in ED , awaiting inpatient admission. Endorses some partial improvement in his mood during this interim, but continues to describe sadness, depression and neurovegetative symptoms as below. At this time denies suicidal ideations. Attributes depression to limited social support and to difficulty with ambulation. Patient attributes poor ambulation to R   hip surgery/replacement, reports R hip area pain, " soreness", but also complains of generalized muscular weakness affecting arms/legs.  Mr. Lucker was admitted for suicidal ideation. Patient reported he lived with and was cared for by mother until 3 years ago, when his mother began dating and took out a restraining order against patient when he became verbally aggressive. Since that time time he has stayed in group homes and homeless shelters until getting his own apartment in February 2020. He has an ACT team. His reported triggers for depression were living alone and difficulty with ambulation. He had a R hip replacement in November 2019. Per patient's brother, patient refused to participate in OT/PT afterwards. He was seen by PT and OT during this admission. There was some concern for parkinson symptoms. Lexapro was increased to 20 mg daily. Zyprexa was decreased to 5 mg QHS and Cogentin 0.5 mg BID was started. Gabapentin was increased to 400 mg TID. Patient refused to participate in group therapy and remained in bed for most of admission despite staff encouragement to participate in unit milieu. PT recommends patient be fitted for orthopedic shoe due to R LE being shorter. He remained on the [facility] unit for 4 days. He stabilized with medication and therapy. He was discharged on the medications listed below. He has shown improvement in mood, appetite and sleep during admission. He denies any SI/HI/AVH and contracts for safety. He agrees to follow up at [facility] and [facility] (see below). He is provided with prescriptions for medications upon discharge. He is discharging home via taxi.  Physical Findings: AIMS: Facial and Oral Movements Muscles of Facial Expression: None, normal Lips and Perioral Area: None, normal Jaw: None, normal Tongue: None, normal,Extremity Movements Upper (arms, wrists, hands, fingers): None, normal Lower (legs, knees, ankles, toes): None, normal, Trunk  Movements Neck, shoulders, hips: None, normal, Overall Severity Severity of abnormal movements (highest score from questions above): None, normal Incapacitation due to abnormal movements: None, normal Patient's awareness of abnormal movements (rate only patient's report): No Awareness, Dental Status Current problems with teeth and/or dentures?: No Does patient usually wear dentures?: No  CIWA:    COWS:     Musculoskeletal: Strength & Muscle Tone: decreased Gait & Station: ambulates with walker Patient leans: N/A  Psychiatric Specialty Exam: Physical Exam  Nursing note and vitals reviewed. Constitutional: He is oriented to person, place, and time. He appears well-developed and well-nourished.  Cardiovascular: Normal rate.  Respiratory: Effort normal.  Neurological: He is alert and oriented to person, place, and time.    Review of Systems  Constitutional: Negative.   Respiratory: Negative for cough and shortness of breath.   Cardiovascular: Negative for chest pain.  Psychiatric/Behavioral: Positive for depression (improving). Negative for hallucinations, substance abuse and suicidal ideas. The patient is not nervous/anxious and does not have insomnia.     Blood pressure 116/76, pulse 72, temperature 97.8 F (36.6 C), resp. rate 20, height 5\' 1"  (  1.549 m), weight 55.8 kg, SpO2 99 %.Body mass index is 23.24 kg/m.  See MD's discharge SRA     Have you used any form of tobacco in the last 30 days? (Cigarettes, Smokeless Tobacco, Cigars, and/or Pipes): Yes  Has this patient used any form of tobacco in the last 30 days? (Cigarettes, Smokeless Tobacco, Cigars, and/or Pipes)  No  Blood Alcohol level:  Lab Results  Component Value Date   ETH <10 09/22/2018   ETH <10 09/16/2018    Metabolic Disorder Labs:  Lab Results  Component Value Date   HGBA1C 5.4 09/26/2018   MPG 108.28 09/26/2018   No results found for: PROLACTIN Lab Results  Component Value Date   CHOL 156 09/26/2018    TRIG 306 (H) 09/26/2018   HDL 32 (L) 09/26/2018   CHOLHDL 4.9 09/26/2018   VLDL 61 (H) 09/26/2018   LDLCALC 63 09/26/2018   LDLCALC 107 (H) 09/19/2018    See Psychiatric Specialty Exam and Suicide Risk Assessment completed by Attending Physician prior to discharge.  Discharge destination:  Home  Is patient on multiple antipsychotic therapies at discharge:  No   Has Patient had three or more failed trials of antipsychotic monotherapy by history:  No  Recommended Plan for Multiple Antipsychotic Therapies: NA  Discharge Instructions    Discharge instructions   Complete by:  As directed    Patient is instructed to take all prescribed medications as recommended. Report any side effects or adverse reactions to your outpatient psychiatrist. Patient is instructed to abstain from alcohol and illegal drugs while on prescription medications. In the event of worsening symptoms, patient is instructed to call the crisis hotline, 911, or go to the nearest emergency department for evaluation and treatment.     Allergies as of 09/29/2018      Reactions   Bee Venom Swelling   Swelling at site of sting   Erythromycin Diarrhea      Medication List    STOP taking these medications   atorvastatin 20 MG tablet Commonly known as:  Lipitor   cholecalciferol 25 MCG (1000 UT) tablet Commonly known as:  VITAMIN D3   dicyclomine 10 MG capsule Commonly known as:  BENTYL   ranitidine 150 MG capsule Commonly known as:  ZANTAC   vitamin B-12 1000 MCG tablet Commonly known as:  CYANOCOBALAMIN     TAKE these medications     Indication  benztropine 0.5 MG tablet Commonly known as:  COGENTIN Take 1 tablet (0.5 mg total) by mouth 2 (two) times daily. For EPS  Indication:  Extrapyramidal Reaction caused by Medications   escitalopram 20 MG tablet Commonly known as:  LEXAPRO Take 1 tablet (20 mg total) by mouth daily. For depression/anxiety Start taking on:  Sep 30, 2018 What changed:     medication strength  how much to take  additional instructions  Indication:  Mood   famotidine 20 MG tablet Commonly known as:  PEPCID Take 1 tablet (20 mg total) by mouth daily. For reflux Start taking on:  Sep 30, 2018  Indication:  Gastroesophageal Reflux Disease   gabapentin 400 MG capsule Commonly known as:  NEURONTIN Take 1 capsule (400 mg total) by mouth 3 (three) times daily. What changed:  when to take this  Indication:  Neuropathic Pain   multivitamin with minerals Tabs tablet Take 1 tablet by mouth daily.  Indication:  Supplementation   OLANZapine 5 MG tablet Commonly known as:  ZYPREXA Take 1 tablet (5 mg total) by mouth  at bedtime. For mood What changed:    medication strength  how much to take  additional instructions  Indication:  Mood   tamsulosin 0.4 MG Caps capsule Commonly known as:  FLOMAX Take 0.4 mg by mouth daily.  Indication:  Benign Enlargement of Prostate      Follow-up Information    [name] Follow up.   Why:  Your ACTT services will continue on Friday, 5/8 after your discharge.  Please expect a phone call from your ACTT team.  Contact information: [address] [place] [id number] [phone]        [facility] Follow up on 10/10/2018.   Why:  Primary care appointment is Tuesday, 5/19 at 10:00a.  The appointment will be held over the phone or WebEX. Please expect a phone call from office prior to your appointment with that information.  Contact information: [address] [place] [id number] [phone]          Follow-up recommendations: Activity as tolerated. Diet as recommended by primary care physician. Keep all scheduled follow-up appointments as recommended.   Comments:   Patient is instructed to take all prescribed medications as recommended. Report any side effects or adverse reactions to your outpatient psychiatrist. Patient is instructed to abstain  from alcohol and illegal drugs while on prescription medications. In the event of worsening symptoms, patient is instructed to call the crisis hotline, 911, or go to the nearest emergency department for evaluation and treatment.  Signed: Aldean BakerJanet E Shineka Auble, NP 09/29/2018, 10:00 AM

## 2018-09-29 NOTE — Progress Notes (Signed)
  [facility] Adult Case Management Discharge Plan :  Will you be returning to the same living situation after discharge:  Yes,  own apartment At discharge, do you have transportation home?: Yes,  taxi will pick up patient at 12:00pm Do you have the ability to pay for your medications: Yes,  Medicaid.  Release of information consent forms completed and in the chart. Taxi voucher on chart.  Patient to Follow up at: Follow-up Information    [name] Follow up.   Why:  Your ACTT services will continue on Friday, 5/8 after your discharge.  Please expect a phone call from your ACTT team.  Contact information: [address] [place] [address] [phone]        [facility] Follow up on 10/10/2018.   Why:  Primary care appointment is Tuesday, 5/19 at 10:00a.  The appointment will be held over the phone or WebEX. Please expect a phone call from office prior to your appointment with that information.  Contact information: [address] [place] [address] [phone]          Next level of care provider has access to [facility] Link:yes  Safety Planning and Suicide Prevention discussed: Yes,  with brother, Jorja Loa  Have you used any form of tobacco in the last 30 days? (Cigarettes, Smokeless Tobacco, Cigars, and/or Pipes): Yes  Has patient been referred to the Quitline?: Patient refused referral  Patient has been referred for addiction treatment: Yes  Darreld Mclean, LCSWA 09/29/2018, 10:16 AM

## 2018-09-29 NOTE — Progress Notes (Signed)
CSW attempted to reach patient's ACT Team Leotis Shames, [phone] and the standard [phone]). Patient's ACT Team lead did not answer x2 and the voicemail was full. The standard number is not in service.  CSW will attempt to reach ACTT again at a later time to confirm patient's discharge and inquire about transportation assistance for patient's primary care and specialist appointment needs.  Enid Cutter, LCSW-A Clinical Social Worker

## 2018-09-29 NOTE — Progress Notes (Signed)
Psychoeducational Group Note  Date:  09/29/2018 Time:  1043  Group Topic/Focus:  Recovery Goals:   The focus of this group is to identify appropriate goals for recovery and establish a plan to achieve them.  Participation Level: Did Not Attend  Participation Quality:  Not Applicable  Affect:  Not Applicable  Cognitive:  Not Applicable  Insight:  Not Applicable  Engagement in Group: Not Applicable  Additional Comments:  Pt refused to attend group this morning. Pt stated " I am tired and sleepy."  Cathy Crounse E 09/29/2018, 10:38 AM

## 2018-09-29 NOTE — [facility]HH Suicide Risk Assessment (Signed)
[facility] Discharge Suicide Risk Assessment   Principal Problem: MDD (major depressive disorder) Discharge Diagnoses: Principal Problem:   MDD (major depressive disorder)   Total Time spent with patient: 15 minutes  Musculoskeletal: Strength & Muscle Tone: decreased Gait & Station: shuffle Patient leans: N/A  Psychiatric Specialty Exam: Review of Systems  All other systems reviewed and are negative.   Blood pressure 116/76, pulse 72, temperature 97.8 F (36.6 C), resp. rate 20, height 5\' 1"  (1.549 m), weight 55.8 kg, SpO2 99 %.Body mass index is 23.24 kg/m.  General Appearance: Casual  Eye Contact::  Fair  Speech:  Normal Rate409  Volume:  Normal  Mood:  Euthymic  Affect:  Flat  Thought Process:  Coherent and Descriptions of Associations: Intact  Orientation:  Full (Time, Place, and Person)  Thought Content:  Logical  Suicidal Thoughts:  No  Homicidal Thoughts:  No  Memory:  Immediate;   Fair Recent;   Fair Remote;   Fair  Judgement:  Intact  Insight:  Lacking  Psychomotor Activity:  Psychomotor Retardation  Concentration:  Fair  Recall:  [employer] of Knowledge:Fair  Language: Fair  Akathisia:  Negative  Handed:  Right  AIMS (if indicated):     Assets:  Desire for Improvement Resilience  Sleep:  Number of Hours: 5.25  Cognition: WNL  ADL's:  Intact   Mental Status Per Nursing Assessment::   On Admission:  Self-harm thoughts  Demographic Factors:  Caucasian, Low socioeconomic status and Living alone  Loss Factors: Decline in physical health  Historical Factors: Impulsivity  Risk Reduction Factors:   Positive therapeutic relationship  Continued Clinical Symptoms:  Depression:   Impulsivity Chronic Pain Medical Diagnoses and Treatments/Surgeries  Cognitive Features That Contribute To Risk:  None    Suicide Risk:  Minimal: No identifiable suicidal ideation.  Patients presenting with no risk factors but with morbid ruminations; may be classified as  minimal risk based on the severity of the depressive symptoms  Follow-up Information    [name] Follow up.   Why:  Your ACTT services will continue on Friday, 5/8 after your discharge.  Please expect a phone call from your ACTT team.  Contact information: [address] [place] [address] [phone]        [facility] Follow up on 10/10/2018.   Why:  Primary care appointment is Tuesday, 5/19 at 10:00a.  The appointment will be held over the phone or WebEX. Please expect a phone call from office prior to your appointment with that information.  Contact information: [address] [place] [address] [phone]          Plan Of Care/Follow-up recommendations:  Activity:  ad lib  Antonieta Pert, MD 09/29/2018, 12:26 PM

## 2018-10-03 ENCOUNTER — Telehealth: Payer: Self-pay | Admitting: *Deleted

## 2018-10-03 NOTE — Telephone Encounter (Signed)
TC to patient. Patient reports he tested negative several days after his ED encounter. Declined retesting at this time.

## 2018-10-03 NOTE — Telephone Encounter (Signed)
TC to patient. Reports he was tested negative several days later and has not developed any symptoms. Declines testing at this time.

## 2018-10-09 NOTE — Progress Notes (Signed)
Patient ID: Gary [name], male   DOB: Feb 01, 1966, 53 y.o.   MRN: [id number] Virtual Visit via Telephone Note  I connected with Aubery Lapping Lemaire on 10/10/18 at 10:00 AM EDT by telephone and verified that I am speaking with the correct person using two identifiers.   Consent:  I discussed the limitations, risks, security and privacy concerns of performing an evaluation and management service by telephone and the availability of in person appointments. I also discussed with the patient that there may be a patient responsible charge related to this service. The patient expressed understanding and agreed to proceed.  Location of patient: The patient was home  Location of provider: I was in the office  Persons participating in the televisit with the patient.   The patient was by himself    History of Present Illness: This is a 53 year old male previous history of bipolar disorder and major depression on a recurrent basis which was severe.  Patient was recently seen initially in the emergency room on 25 April with chest pain and severe anxiety.  He was admitted and had a coronary catheterization which was normal.  There was a question of an end STEMI but this was ruled out.  Patient returned to the emergency room however on May 4 and was found at that time to have suicidal ideation was brought in under an IVC.  He was transferred to the behavioral health hospital for which excerpts from the admission are below Date of Admission:  09/25/2018 Date of Discharge: 09/29/18  Reason for Admission:  suicidal ideation  Principal Problem: MDD (major depressive disorder) Discharge Diagnoses: Principal Problem:   MDD (major depressive disorder)   Past Psychiatric History: Per admission H&P: patient reports he has been diagnosed with Bipolar Disorder, butreports mainly depression and does not endorse clear history of manic episodes.Does endorse depression, which he describes as chronic. Denies history of  suicidal attempts or of self injurious behaviors. Denies history of psychosis. Denies history of PTSD. Reports one prior psychiatric admission in 2017, for depression.  Past Medical History:      Past Medical History:  Diagnosis Date  . Anxiety   . Bipolar 2 disorder (HCC)   . Depression   . GERD (gastroesophageal reflux disease)   . Hyperhydrosis disorder          Past Surgical History:  Procedure Laterality Date  . HERNIA REPAIR    . HIP SURGERY Right   . LEFT HEART CATH AND CORONARY ANGIOGRAPHY N/A 09/19/2018   Procedure: LEFT HEART CATH AND CORONARY ANGIOGRAPHY;  Surgeon: [name], MD;  Location: [facility] INVASIVE CV LAB;  Service: Cardiovascular;  Laterality: N/A;   Family History: History reviewed. No pertinent family history. Family Psychiatric  History: Per admission H&P: denies mental illness or suicides in family. Father was alcoholic Social History:  Social History      Substance and Sexual Activity  Alcohol Use No     Social History      Substance and Sexual Activity  Drug Use No    Social History        Socioeconomic History  . Marital status: Single    Spouse name: Not on file  . Number of children: Not on file  . Years of education: Not on file  . Highest education level: Not on file  Occupational History  . Not on file  Social Needs  . Financial resource strain: Not on file  . Food insecurity:    Worry: Not on  file    Inability: Not on file  . Transportation needs:    Medical: Not on file    Non-medical: Not on file  Tobacco Use  . Smoking status: Never Smoker  . Smokeless tobacco: Never Used  Substance and Sexual Activity  . Alcohol use: No  . Drug use: No  . Sexual activity: Not on file  Lifestyle  . Physical activity:    Days per week: Not on file    Minutes per session: Not on file  . Stress: Not on file  Relationships  . Social connections:    Talks on phone: Not on file    Gets together:  Not on file    Attends religious service: Not on file    Active member of club or organization: Not on file    Attends meetings of clubs or organizations: Not on file    Relationship status: Not on file  Other Topics Concern  . Not on file  Social History Narrative  . Not on file    Hospital Course:  From admission H&P: 53 year old male, lives alone, on disability. Had initially presented ED initially on 4/25, reporting anxiety, depression, low energy, suicidal ideations which were characterized as passive. He was evaluated by Cardiology consultant and recommended for Heart Catheterization due to significant exertional dyspnea and elevated troponin on admission. Cath was negative for acute findings. Was discharged on 4/28. Returned to ED on 5/1 under IVC, due to suicidal ideations . He also reported generalized weakness, difficulty with ambulation. He has been in ED , awaiting inpatient admission. Endorses some partial improvement in his mood during this interim, but continues to describe sadness, depression and neurovegetative symptoms as below. At this time denies suicidal ideations. Attributes depression to limited social support and to difficulty with ambulation.Patient attributes poor ambulation to R hip surgery/replacement, reports R hip area pain, " soreness", but also complains of generalized muscular weakness affecting arms/legs.  Mr. Modena Nunnerypple was admitted for suicidal ideation. Patient reported he lived with and was cared for by mother until 3 years ago, when his mother began dating and took out a restraining order against patient when he became verbally aggressive. Since that time time he has stayed in group homes and homeless shelters until getting his own apartment in February 2020. He has an ACT team. His reported triggers for depression were living alone and difficulty with ambulation. He had a R hip replacement in November 2019. Per patient's brother, patient refused to  participate in OT/PT afterwards. He was seen by PT and OT during this admission. There was some concern for parkinson symptoms. Lexapro was increased to 20 mg daily. Zyprexa was decreased to 5 mg QHS and Cogentin 0.5 mg BID was started. Gabapentin was increased to 400 mg TID. Patient refused to participate in group therapy and remained in bed for most of admission despite staff encouragement to participate in unit milieu. PT recommends patient be fitted for orthopedic shoe due to R LE being shorter. [facility] He stabilized with medication and therapy. He was discharged on the medications listed below. He has shown improvement in mood, appetite and sleep during admission. He denies any SI/HI/AVH and contracts for safety. [facility]see [facility]). He is provided with prescriptions for medications upon discharge. He is discharging home via taxi.  Since discharge the patient states that [facility].  He does have his medications and has been compliant with them since discharge.  He also states his primary care provider is in [facility] and he would like to return to that practice.  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  He was supposed to have an orthopedic shoe recommended by physical therapy but this did not get accomplished in the behavioral health admission.  He states he is sleeping better.  He does not have current suicidal ideation.  His PHQ 9 score is at 9 at this visit.  Pos in BOLD  Constitutional:   No  weight loss, night sweats,  Fevers, chills, fatigue, lassitude. HEENT:   No headaches,  Difficulty swallowing,  Tooth/dental problems,  Sore throat,                No sneezing, itching, ear ache, nasal congestion, post nasal drip,   CV:  No chest  pain,  Orthopnea, PND, swelling in lower extremities, anasarca, dizziness, palpitations  GI  No heartburn, indigestion, abdominal pain, nausea, vomiting, diarrhea, change in bowel habits, loss of appetite  Resp: No shortness of breath with exertion or at rest.  No excess mucus, no productive cough,  No non-productive cough,  No coughing up of blood.  No change in color of mucus.  No wheezing.  No chest wall deformity  Skin: no rash or lesions.  GU: no dysuria, change in color of urine, no urgency or frequency.  No flank pain.  MS:  No joint pain or swelling.  No decreased range of motion.  No back pain.  Psych:   change in mood or affect.  depression and anxiety.  No memory loss.  Observations/Objective: No observations as this was a phone visit  Assessment and Plan: #1 severe major depression recurrent with generalized anxiety disorder: The patient will be connected with our licensed clinical social worker and a follow-up appointment with Vesta Mixer will be made.  Note the patient also has access to the ACT team and they will connect with the patient as well.  Note the patient is taking his medications and has an adequate supply at home at this time.  #2 primary care follow-up: The patient states he will go back to his original primary care provider and does not need to connect with this clinic further  #3 gait disturbance and frequent falls this patient will need a walker and also an orthopedic shoe because his right leg is shorter than the left status post right hip surgery  Follow Up Instructions:    I discussed the assessment and treatment plan with the patient. The patient was provided an opportunity to ask questions and all were answered. The patient agreed with the plan and demonstrated an understanding of the instructions.   The patient was advised to call back or seek an in-person evaluation if the symptoms worsen or if the condition fails to improve as anticipated.  I provided  45 minutes of non-face-to-face time during this encounter  including  median intraservice time , review of notes, labs, imaging, medications  and explaining diagnosis and management to the patient .    Shan Levans, MD

## 2018-10-10 ENCOUNTER — Ambulatory Visit: Payer: Medicaid Other | Attending: Critical Care Medicine | Admitting: Critical Care Medicine

## 2018-10-10 ENCOUNTER — Encounter: Payer: Self-pay | Admitting: Critical Care Medicine

## 2018-10-10 ENCOUNTER — Telehealth: Payer: Self-pay

## 2018-10-10 ENCOUNTER — Other Ambulatory Visit: Payer: Self-pay

## 2018-10-10 DIAGNOSIS — F411 Generalized anxiety disorder: Secondary | ICD-10-CM | POA: Diagnosis not present

## 2018-10-10 DIAGNOSIS — F332 Major depressive disorder, recurrent severe without psychotic features: Secondary | ICD-10-CM

## 2018-10-10 DIAGNOSIS — M2170 Unequal limb length (acquired), unspecified site: Secondary | ICD-10-CM | POA: Diagnosis not present

## 2018-10-10 DIAGNOSIS — R269 Unspecified abnormalities of gait and mobility: Secondary | ICD-10-CM | POA: Diagnosis not present

## 2018-10-10 NOTE — Telephone Encounter (Signed)
Order for walker faxed to [facility]

## 2018-10-10 NOTE — Progress Notes (Signed)
Hospital f/u for depression and suicidal Ideation, Per pt he do not have a plan but he had a thought that he'll be better off dead about a couple days ago but he is alright now.

## 2018-10-11 NOTE — Telephone Encounter (Signed)
Call placed to [facility] to inquire about a provider of orthopedic shoe inserts who accepts medicaid.  Spoke to [place] who stated that there is no provider in the area that accepts medicaid for such products.  She suggested contacting the DSS caseworker.   Call placed to DSS, spoke to [facility] who took message requesting medicaid department return call to this CM # [phone]  Call received from Ms Rivers/DSS. She said that they do not have a list of approved providers and do not have a list of what medicaid pays for.  She suggested that the patient check the back of his medicaid card for customer service number.  Attempted to contact patient #  [phone] to have him check medicaid customer service number.  Message left requesting a call back to this CM # [phone]

## 2018-10-12 ENCOUNTER — Telehealth: Payer: Self-pay | Admitting: Internal Medicine

## 2018-10-12 ENCOUNTER — Telehealth: Payer: Self-pay

## 2018-10-12 NOTE — Telephone Encounter (Signed)
Patient called states they cant find the number. Please follow up

## 2018-10-12 NOTE — Telephone Encounter (Signed)
Call placed to patient to obtain the customer service number from his medicaid card. Informed him that this number is needed to check for medicaid approved provider for orthotic shoe insert.  He said that he did not have the card available and would call this CM back when he had the card.  Provided him with the # for [facility]

## 2018-10-13 ENCOUNTER — Telehealth: Payer: Self-pay | Admitting: Internal Medicine

## 2018-10-13 NOTE — Telephone Encounter (Signed)
Patient called to provide the Customer service phone number on the back of his medicaid card [phone]  Please advice [phone]  Thank you  Louisa Second

## 2018-10-17 ENCOUNTER — Telehealth: Payer: Self-pay

## 2018-10-17 NOTE — Telephone Encounter (Signed)
Call placed to [facility] Contact Center # [phone] to inquire about coverage for orthopedic shoe insert. Spoke to [place] who stated that the patient has coverage, she needs to find out what DME company can provide this  She explained that a representative from the DME Department  will call this CM back in 1-2 business days with list of DME providers.    Call placed to the patient and provided him with an update.

## 2018-10-19 ENCOUNTER — Telehealth: Payer: Self-pay

## 2018-10-19 NOTE — Telephone Encounter (Signed)
Call received from [place]/[facility].  She stated that the walker was shipped to the patient on 10/10/2018.

## 2018-10-19 NOTE — Telephone Encounter (Signed)
Call placed to [facility] to check on status of walker order. Spoke to [place] who said that she does not see that the order has been processed. She will check on call this CM back # [phone]

## 2018-10-24 ENCOUNTER — Telehealth: Payer: Self-pay

## 2018-10-24 NOTE — Telephone Encounter (Addendum)
Call received from Gary [name]/DSS with listing of medicaid  providers of orthotic shoe inserts.  She said that this list was updated 05/2018.  [facility] # [phone] - spoke to [name] who explained that medicaid will not pay for the inserts unless patient is a diabetic.She stated that DSS does not always explain this coverage criteria   Patient would need to pay out of pocket - $300.   Second to [facility] [phone] - called this number multiple times, fast busy signal.    [facility] # [phone] - number not in service.   # [phone] - left message.   [facility] # [phone]- fast busy signal   [name] # [phone] - number disconnected and no listing under this name.   [facility] # [phone] - spoke to [name] who said they do not carry orthotics  [name] # [phone], spoek to [name] who said that they do not carry shoe inserts.   Call placed to patient and informed him that medicaid will not pay for the orthotic and he said that he is not able to afford to pay for it privately.  He said that he has been in contact with his ACTT and has spoken to them several times. He also noted that he plans to continue to follow up with his PCP - Gary [name].

## 2018-10-24 NOTE — Telephone Encounter (Signed)
Call placed to [facility] Contact Center # [phone] to inquire about coverage for orthopedic shoe insert. Spoke to [place] and explained that this CM didn't receive a call back from the DME department with approved providers. She placed another request -  Reference #   [id number]  And she said she would forward to " tier 2 team. "

## 2018-10-25 ENCOUNTER — Ambulatory Visit: Payer: Medicaid Other | Admitting: Licensed Clinical Social Worker

## 2020-04-04 ENCOUNTER — Other Ambulatory Visit: Payer: Self-pay

## 2020-04-04 ENCOUNTER — Encounter (HOSPITAL_COMMUNITY): Payer: Self-pay

## 2020-04-04 ENCOUNTER — Emergency Department (HOSPITAL_COMMUNITY): Payer: Medicaid Other

## 2020-04-04 ENCOUNTER — Emergency Department (HOSPITAL_COMMUNITY)
Admission: EM | Admit: 2020-04-04 | Discharge: 2020-04-05 | Disposition: A | Discharge status: Home / Self Care | Payer: Medicaid Other | Attending: Emergency Medicine | Admitting: Emergency Medicine

## 2020-04-04 DIAGNOSIS — Z955 Presence of coronary angioplasty implant and graft: Secondary | ICD-10-CM | POA: Insufficient documentation

## 2020-04-04 DIAGNOSIS — Z79899 Other long term (current) drug therapy: Secondary | ICD-10-CM | POA: Diagnosis not present

## 2020-04-04 DIAGNOSIS — R079 Chest pain, unspecified: Secondary | ICD-10-CM | POA: Diagnosis present

## 2020-04-04 LAB — BASIC METABOLIC PANEL
Anion gap: 10 (ref 5–15)
BUN: 14 mg/dL (ref 6–20)
CO2: 24 mmol/L (ref 22–32)
Calcium: 8.9 mg/dL (ref 8.9–10.3)
Chloride: 100 mmol/L (ref 98–111)
Creatinine, Ser: 1.14 mg/dL (ref 0.61–1.24)
GFR, Estimated: >60 mL/min (ref >60)
Glucose, Bld: 93 mg/dL (ref 70–99)
Potassium: 4 mmol/L (ref 3.5–5.1)
Sodium: 134 mmol/L — ABNORMAL LOW (ref 135–145)

## 2020-04-04 LAB — TROPONIN I (HIGH SENSITIVITY): Troponin I (High Sensitivity): 3 ng/L (ref <18)

## 2020-04-04 LAB — CBC
HCT: 39 % (ref 39.0–52.0)
Hemoglobin: 13.2 g/dL (ref 13.0–17.0)
MCH: 32.2 pg (ref 26.0–34.0)
MCHC: 33.8 g/dL (ref 30.0–36.0)
MCV: 95.1 fL (ref 80.0–100.0)
Platelets: 270 10*3/uL (ref 150–400)
RBC: 4.1 MIL/uL — ABNORMAL LOW (ref 4.22–5.81)
RDW: 12.3 % (ref 11.5–15.5)
WBC: 6.3 10*3/uL (ref 4.0–10.5)
nRBC: 0 % (ref 0.0–0.2)

## 2020-04-04 IMAGING — DX DG CHEST 2V
2 series · 2 of 2 positions shown · non-contrast
Comparison: [DATE]

CLINICAL DATA: Chest pain

EXAM:
CHEST - 2 VIEW

[chest lat]
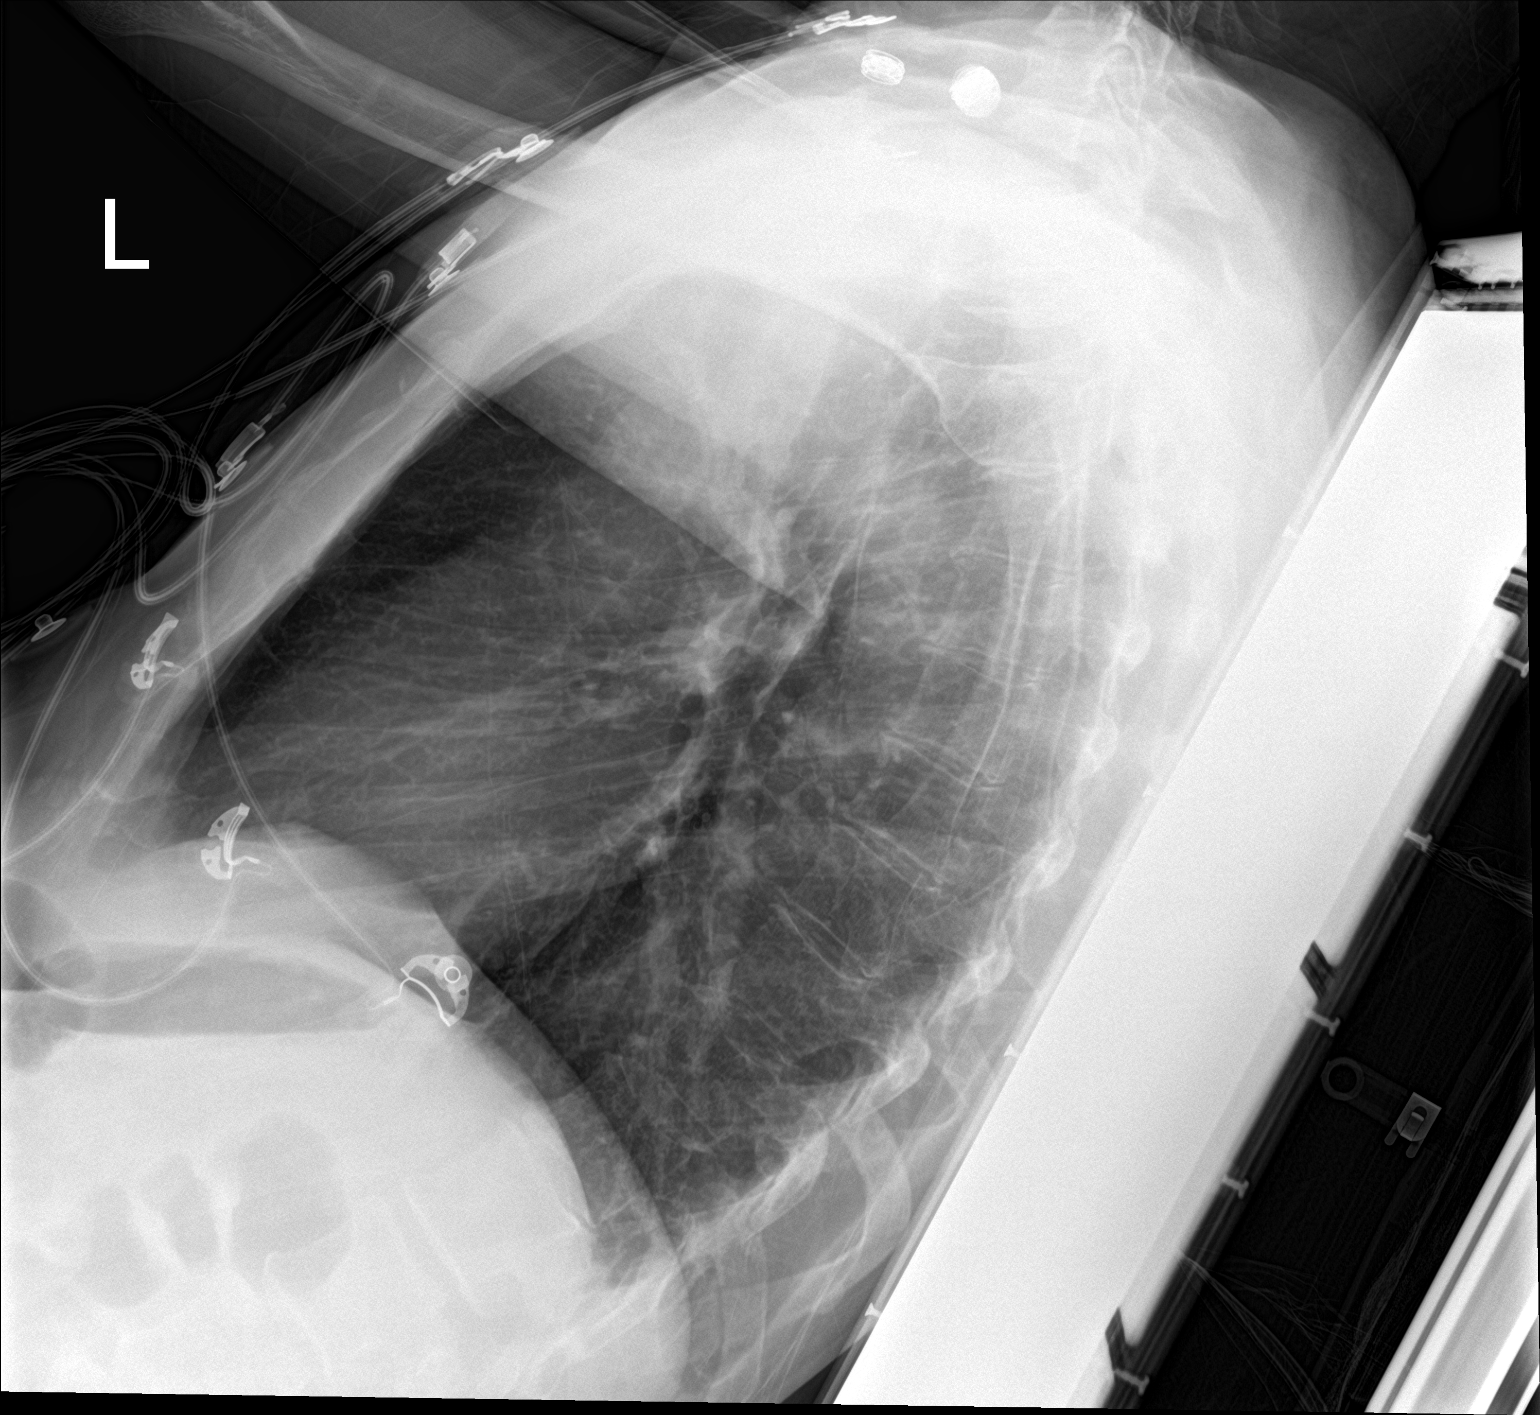

[chest ap]
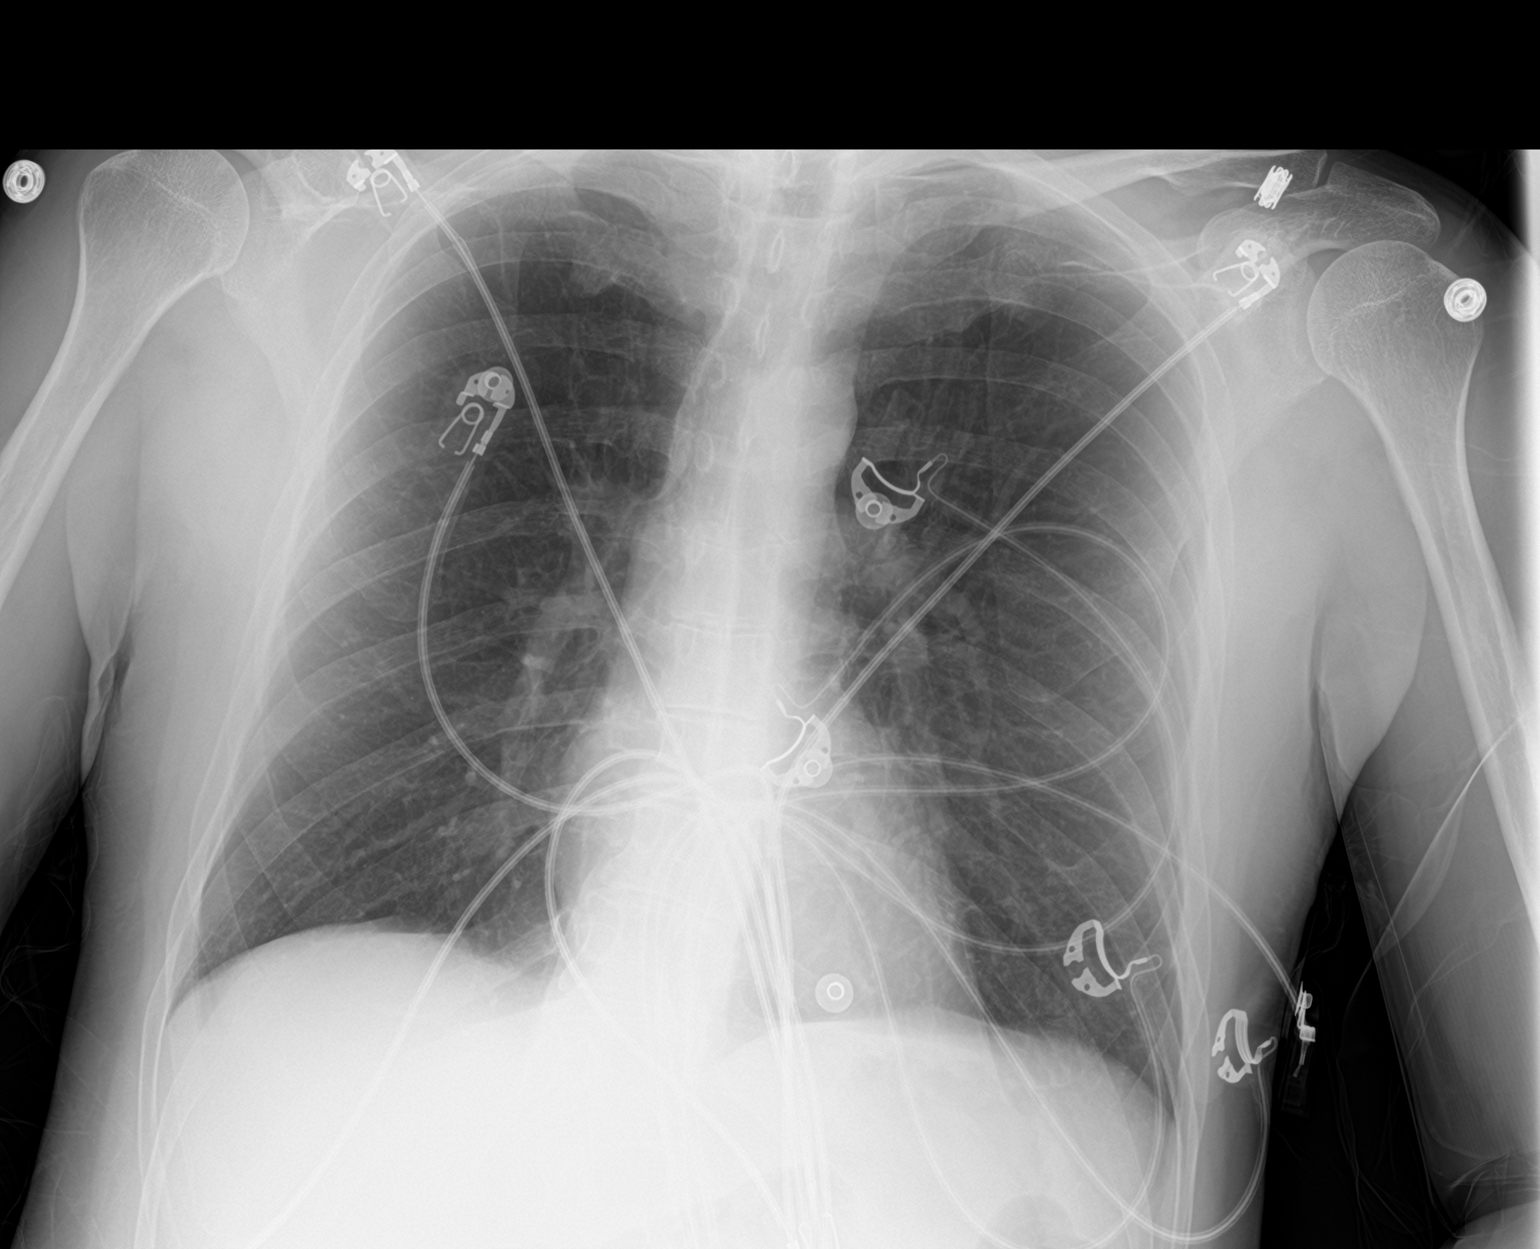

[2 of 2 positions shown; findings below may reference images not displayed]

FINDINGS: The heart size and mediastinal contours are within normal limits.
Both lungs are clear. The visualized skeletal structures are
unremarkable.
IMPRESSION: No active cardiopulmonary disease.

## 2020-04-04 NOTE — ED Provider Notes (Signed)
[facility] EMERGENCY DEPARTMENT Provider Note   CSN: [id number] Arrival date & time: 04/04/20  2211     History Chief Complaint  Patient presents with  . Chest Pain    Haron Beilke Backes is a 54 y.o. male.  The history is provided by the patient and medical records.  Chest Pain   54 y.o. M with hx of anxiety, bipolar disorder, depression, GERD, MDD, presenting to the ED for chest pain.  Patient states he was at [facility] on motorized cart doing his weekly shopping when he all of a sudden got chest pain in the center of his chest with some lightheadedness and feeling like he was going to pass out. He remained in his cart and never lost consciousness.  States he finished his errands, went home and took his gabapentin and prozac with some improvement.  He denies current chest pain, states he feels "tight".  No recent cough, fever, or other illness.  He states "I think I just might be dehydrated or something."  He reports his appetite has been poor lately and he has not eaten today, only drank half a Dr. Reino Kent.  Had cardiac cath in April 2020 after abnormal stress test but no significant disease or PCI.  No other cardiac issues.  Past Medical History:  Diagnosis Date  . Anxiety   . Bipolar 2 disorder (HCC)   . Depression   . GERD (gastroesophageal reflux disease)   . Hyperhydrosis disorder     Patient Active Problem List   Diagnosis Date Noted  . MDD (major depressive disorder) 09/25/2018  . GERD (gastroesophageal reflux disease)   . Bipolar affective disorder, currently active (HCC) 05/16/2017  . Severe recurrent major depression without psychotic features (HCC) 11/06/2015  . GAD (generalized anxiety disorder) 11/06/2015    Past Surgical History:  Procedure Laterality Date  . HERNIA REPAIR    . HIP SURGERY Right   . LEFT HEART CATH AND CORONARY ANGIOGRAPHY N/A 09/19/2018   Procedure: LEFT HEART CATH AND CORONARY ANGIOGRAPHY;  Surgeon: [name], MD;   Location: [facility] INVASIVE CV LAB;  Service: Cardiovascular;  Laterality: N/A;       History reviewed. No pertinent family history.  Social History   Tobacco Use  . Smoking status: Never Smoker  . Smokeless tobacco: Never Used  Vaping Use  . Vaping Use: Never used  Substance Use Topics  . Alcohol use: No  . Drug use: No    Home Medications Prior to Admission medications   Medication Sig Start Date End Date Taking? Authorizing Provider  Atorvastatin Calcium (LIPITOR PO) Take by mouth daily.    [provider]  benztropine (COGENTIN) 0.5 MG tablet Take 1 tablet (0.5 mg total) by mouth 2 (two) times daily. For EPS 09/29/18   Aldean Baker, NP  escitalopram (LEXAPRO) 20 MG tablet Take 1 tablet (20 mg total) by mouth daily. For depression/anxiety 09/30/18   Aldean Baker, NP  famotidine (PEPCID) 20 MG tablet Take 1 tablet (20 mg total) by mouth daily. For reflux 09/30/18   Aldean Baker, NP  gabapentin (NEURONTIN) 400 MG capsule Take 1 capsule (400 mg total) by mouth 3 (three) times daily. 09/29/18   Aldean Baker, NP  Multiple Vitamin (MULTIVITAMIN WITH MINERALS) TABS tablet Take 1 tablet by mouth daily.    [provider]  OLANZapine (ZYPREXA) 5 MG tablet Take 1 tablet (5 mg total) by mouth at bedtime. For mood 09/29/18   Aldean Baker,  NP  tamsulosin (FLOMAX) 0.4 MG CAPS capsule Take 0.4 mg by mouth daily.     [provider]    Allergies    Bee venom and Erythromycin  Review of Systems   Review of Systems  Cardiovascular: Positive for chest pain.  All other systems reviewed and are negative.   Physical Exam Updated Vital Signs BP 133/87 (BP Location: Left Arm)   Pulse 76   Temp (!) 97 F (36.1 C) (Temporal)   Resp 17   Ht 5\' 3"  (1.6 m)   Wt 56.2 kg   SpO2 98%   BMI 21.97 kg/m   Physical Exam Vitals and nursing note reviewed.  Constitutional:      Appearance: He is well-developed.     Comments: Appears older than stated age  HENT:     Head:  Normocephalic and atraumatic.  Eyes:     Conjunctiva/sclera: Conjunctivae normal.     Pupils: Pupils are equal, round, and reactive to light.  Cardiovascular:     Rate and Rhythm: Normal rate and regular rhythm.     Heart sounds: Normal heart sounds.  Pulmonary:     Effort: Pulmonary effort is normal.     Breath sounds: Normal breath sounds. No wheezing or rhonchi.  Abdominal:     General: Bowel sounds are normal.     Palpations: Abdomen is soft.  Musculoskeletal:        General: Normal range of motion.     Cervical back: Normal range of motion.  Skin:    General: Skin is warm and dry.  Neurological:     Mental Status: He is alert and oriented to person, place, and time.     ED Results / Procedures / Treatments   Labs (all labs ordered are listed, but only abnormal results are displayed) Labs Reviewed  BASIC METABOLIC PANEL - Abnormal; Notable for the following components:      Result Value   Sodium 134 (*)    All other components within normal limits  CBC - Abnormal; Notable for the following components:   RBC 4.10 (*)    All other components within normal limits  TROPONIN I (HIGH SENSITIVITY)  TROPONIN I (HIGH SENSITIVITY)    EKG None  Radiology DG Chest 2 View  Result Date: 04/04/2020 CLINICAL DATA:  Chest pain EXAM: CHEST - 2 VIEW COMPARISON:  09/22/2018 FINDINGS: The heart size and mediastinal contours are within normal limits. Both lungs are clear. The visualized skeletal structures are unremarkable. IMPRESSION: No active cardiopulmonary disease. Electronically Signed   By: 11/22/2018 MD   On: 04/04/2020 22:45    Procedures Procedures (including critical care time)  Medications Ordered in ED Medications - No data to display  ED Course  I have reviewed the triage vital signs and the nursing notes.  Pertinent labs & imaging results that were available during my care of the patient were reviewed by me and considered in my medical decision making (see  chart for details).    MDM Rules/Calculators/A&P  54 year old male here with episode of chest pain while shopping at [facility].  He was riding a motorized cart when symptoms began.  States he felt lightheaded and almost like he was going to pass out.  Symptoms did improve somewhat with his home gabapentin and Prozac.  He is awake, alert, appropriately oriented on arrival.  He is in no acute distress.  He tells me that he feels like he just needs to eat as he has not eaten  all day, only drank half of a Dr. Reino Kent.  EKG is nonischemic.  Labs are reassuring, first troponin is negative.  Chest x-ray is clear.  Patient given meal, will obtain delta trop.  States he feels much better after eating/drinking.  Delta trop is negative.  Symptoms seem atypical.  Lower suspicion for ACS, PE, dissection, acute cardiac event.  Feel he is stable for discharge home with close PCP follow-up.  He was encouraged to eat regular meals.  Return here for any new or acute changes.  Final Clinical Impression(s) / ED Diagnoses Final diagnoses:  Chest pain in adult    Rx / DC Orders ED Discharge Orders    None       Garlon Hatchet, PA-C 04/05/20 0155    Alvira Monday, MD 04/05/20 1102

## 2020-04-04 NOTE — ED Triage Notes (Signed)
Pt BIB [facility] for eval of CP. Pt called EMS at approx 2130, made EMS aware that 2hrs prior, pt was shopping and began to experience 9/10 chest tightness. Pt took home doses of gabapentin & prozac in company of EMS, was given 324 of ASA & 1 of NTG, brought pressure from 172/98 to 120/74 & chest tightness from 9-7.   20g L FA/AC

## 2020-04-05 LAB — TROPONIN I (HIGH SENSITIVITY): Troponin I (High Sensitivity): 5 ng/L (ref <18)

## 2020-04-05 NOTE — Discharge Instructions (Signed)
Cardiac work up today was negative. Make sure to eat/drink regularly. Follow-up with your primary care doctor. Return here for any new/acute changes.

## 2020-04-05 NOTE — ED Notes (Signed)
Patient verbalizes understanding of discharge instructions. Opportunity for questioning and answers were provided. Armband removed by staff, pt discharged from ED utilizing taxi voucher

## 2021-07-04 ENCOUNTER — Emergency Department (HOSPITAL_COMMUNITY): Payer: Medicaid Other

## 2021-07-04 ENCOUNTER — Observation Stay (HOSPITAL_COMMUNITY): Payer: Medicaid Other

## 2021-07-04 ENCOUNTER — Encounter (HOSPITAL_COMMUNITY): Payer: Self-pay | Admitting: Family Medicine

## 2021-07-04 ENCOUNTER — Inpatient Hospital Stay (HOSPITAL_COMMUNITY)
Admission: EM | Admit: 2021-07-04 | Discharge: 2021-07-09 | DRG: 092 | Disposition: A | Discharge status: Skilled Nursing Facility | Payer: Medicaid Other | Attending: Internal Medicine | Admitting: Internal Medicine

## 2021-07-04 DIAGNOSIS — W1830XA Fall on same level, unspecified, initial encounter: Secondary | ICD-10-CM | POA: Diagnosis present

## 2021-07-04 DIAGNOSIS — M25559 Pain in unspecified hip: Secondary | ICD-10-CM

## 2021-07-04 DIAGNOSIS — W19XXXA Unspecified fall, initial encounter: Secondary | ICD-10-CM

## 2021-07-04 DIAGNOSIS — Z79899 Other long term (current) drug therapy: Secondary | ICD-10-CM

## 2021-07-04 DIAGNOSIS — Z96641 Presence of right artificial hip joint: Secondary | ICD-10-CM | POA: Diagnosis present

## 2021-07-04 DIAGNOSIS — K219 Gastro-esophageal reflux disease without esophagitis: Secondary | ICD-10-CM | POA: Diagnosis present

## 2021-07-04 DIAGNOSIS — F319 Bipolar disorder, unspecified: Secondary | ICD-10-CM | POA: Diagnosis present

## 2021-07-04 DIAGNOSIS — R270 Ataxia, unspecified: Principal | ICD-10-CM | POA: Diagnosis present

## 2021-07-04 DIAGNOSIS — Z20822 Contact with and (suspected) exposure to covid-19: Secondary | ICD-10-CM | POA: Diagnosis present

## 2021-07-04 DIAGNOSIS — F3181 Bipolar II disorder: Secondary | ICD-10-CM | POA: Diagnosis present

## 2021-07-04 DIAGNOSIS — R29898 Other symptoms and signs involving the musculoskeletal system: Secondary | ICD-10-CM | POA: Diagnosis not present

## 2021-07-04 DIAGNOSIS — Z8673 Personal history of transient ischemic attack (TIA), and cerebral infarction without residual deficits: Secondary | ICD-10-CM

## 2021-07-04 DIAGNOSIS — E860 Dehydration: Secondary | ICD-10-CM | POA: Diagnosis present

## 2021-07-04 DIAGNOSIS — F411 Generalized anxiety disorder: Secondary | ICD-10-CM | POA: Diagnosis present

## 2021-07-04 DIAGNOSIS — N179 Acute kidney failure, unspecified: Secondary | ICD-10-CM | POA: Diagnosis present

## 2021-07-04 DIAGNOSIS — M25551 Pain in right hip: Secondary | ICD-10-CM

## 2021-07-04 DIAGNOSIS — I10 Essential (primary) hypertension: Secondary | ICD-10-CM | POA: Diagnosis present

## 2021-07-04 DIAGNOSIS — Z881 Allergy status to other antibiotic agents status: Secondary | ICD-10-CM

## 2021-07-04 DIAGNOSIS — Z91030 Bee allergy status: Secondary | ICD-10-CM

## 2021-07-04 LAB — COMPREHENSIVE METABOLIC PANEL
ALT: 17 U/L (ref 0–44)
AST: 21 U/L (ref 15–41)
Albumin: 4.2 g/dL (ref 3.5–5.0)
Alkaline Phosphatase: 91 U/L (ref 38–126)
Anion gap: 10 (ref 5–15)
BUN: 14 mg/dL (ref 6–20)
CO2: 26 mmol/L (ref 22–32)
Calcium: 9.5 mg/dL (ref 8.9–10.3)
Chloride: 102 mmol/L (ref 98–111)
Creatinine, Ser: 1.39 mg/dL — ABNORMAL HIGH (ref 0.61–1.24)
GFR, Estimated: 59 mL/min — ABNORMAL LOW (ref >60)
Glucose, Bld: 127 mg/dL — ABNORMAL HIGH (ref 70–99)
Potassium: 4.3 mmol/L (ref 3.5–5.1)
Sodium: 138 mmol/L (ref 135–145)
Total Bilirubin: 0.5 mg/dL (ref 0.3–1.2)
Total Protein: 7 g/dL (ref 6.5–8.1)

## 2021-07-04 LAB — CBC
HCT: 43.5 % (ref 39.0–52.0)
Hemoglobin: 15.1 g/dL (ref 13.0–17.0)
MCH: 32.7 pg (ref 26.0–34.0)
MCHC: 34.7 g/dL (ref 30.0–36.0)
MCV: 94.2 fL (ref 80.0–100.0)
Platelets: 255 10*3/uL (ref 150–400)
RBC: 4.62 MIL/uL (ref 4.22–5.81)
RDW: 12.3 % (ref 11.5–15.5)
WBC: 6.6 10*3/uL (ref 4.0–10.5)
nRBC: 0 % (ref 0.0–0.2)

## 2021-07-04 LAB — RESP PANEL BY RT-PCR (FLU A&B, COVID) ARPGX2
Influenza A by PCR: NEGATIVE
Influenza B by PCR: NEGATIVE
SARS Coronavirus 2 by RT PCR: NEGATIVE

## 2021-07-04 LAB — CBG MONITORING, ED: Glucose-Capillary: 122 mg/dL — ABNORMAL HIGH (ref 70–99)

## 2021-07-04 IMAGING — CR DG CHEST 1V
1 series · 1 of 1 positions shown · non-contrast
Comparison: [DATE]

CLINICAL DATA: Weakness.  Dizziness.

EXAM:
CHEST  1 VIEW

[chest ap]
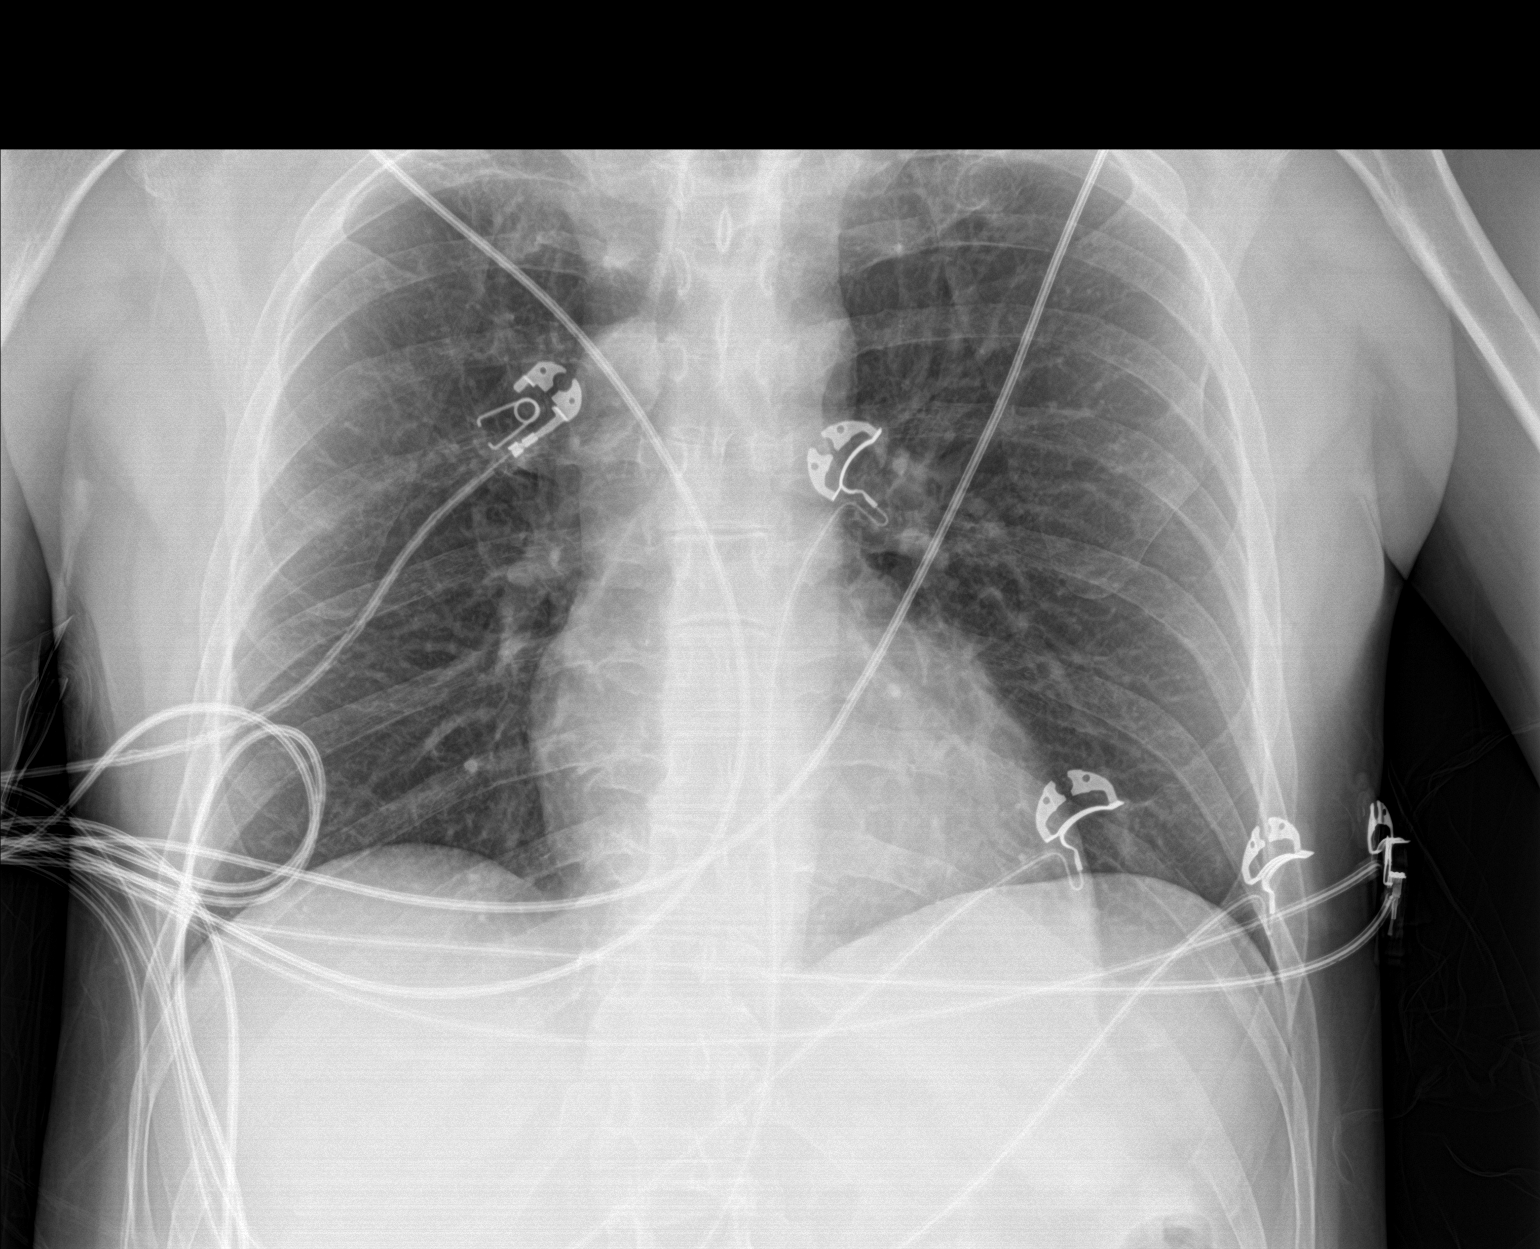

[1 of 1 positions shown; findings below may reference images not displayed]

FINDINGS: Heart size and mediastinal contours are unremarkable. No pleural
effusion or edema identified. No airspace opacities. Visualized
osseous structures are unremarkable.
IMPRESSION: No acute cardiopulmonary abnormalities.

## 2021-07-04 MED ORDER — OLANZAPINE 5 MG PO TABS
5.0000 mg | ORAL_TABLET | Freq: Every day | ORAL | Status: DC
  Administered 2021-07-04 – 2021-07-08 (×5): 5 mg via ORAL
  Filled 2021-07-04 (×6): qty 1

## 2021-07-04 MED ORDER — ENOXAPARIN SODIUM 40 MG/0.4ML IJ SOSY
40.0000 mg | PREFILLED_SYRINGE | Freq: Every day | INTRAMUSCULAR | Status: DC
  Administered 2021-07-05 – 2021-07-09 (×5): 40 mg via SUBCUTANEOUS
  Filled 2021-07-04 (×5): qty 0.4

## 2021-07-04 MED ORDER — SODIUM CHLORIDE 0.9 % IV SOLN: 250.0000 mL | INTRAVENOUS | Status: DC | PRN

## 2021-07-04 MED ORDER — LORAZEPAM 1 MG PO TABS: 1.0000 mg | ORAL_TABLET | Freq: Three times a day (TID) | ORAL | Status: DC | PRN

## 2021-07-04 MED ORDER — SODIUM CHLORIDE 0.9% FLUSH
3.0000 mL | Freq: Two times a day (BID) | INTRAVENOUS | Status: DC
  Administered 2021-07-04 – 2021-07-09 (×8): 3 mL via INTRAVENOUS

## 2021-07-04 MED ORDER — ACETAMINOPHEN 325 MG PO TABS: 650.0000 mg | ORAL_TABLET | Freq: Four times a day (QID) | ORAL | Status: DC | PRN

## 2021-07-04 MED ORDER — SODIUM CHLORIDE 0.9% FLUSH: 3.0000 mL | INTRAVENOUS | Status: DC | PRN

## 2021-07-04 MED ORDER — ESCITALOPRAM OXALATE 10 MG PO TABS
20.0000 mg | ORAL_TABLET | Freq: Every day | ORAL | Status: DC
Start: 2021-07-05 — End: 2021-07-09
  Administered 2021-07-05 – 2021-07-09 (×5): 20 mg via ORAL
  Filled 2021-07-04 (×5): qty 2

## 2021-07-04 MED ORDER — ACETAMINOPHEN 650 MG RE SUPP: 650.0000 mg | Freq: Four times a day (QID) | RECTAL | Status: DC | PRN

## 2021-07-04 NOTE — Discharge Instructions (Addendum)
Follow with Primary MD Jackie Plum, MD in 7 days   Get CBC, CMP, Magnesium, TSH -  checked next visit within 1 week by SNF MD    Activity: As tolerated with Full fall precautions use walker/cane & assistance as needed  Disposition SNF  Diet: Heart Healthy    Special Instructions: If you have smoked or chewed Tobacco  in the last 2 yrs please stop smoking, stop any regular Alcohol  and or any Recreational drug use.  On your next visit with your primary care physician please Get Medicines reviewed and adjusted.  Please request your Prim.MD to go over all Hospital Tests and Procedure/Radiological results at the follow up, please get all Hospital records sent to your Prim MD by signing hospital release before you go home.  If you experience worsening of your admission symptoms, develop shortness of breath, life threatening emergency, suicidal or homicidal thoughts you must seek medical attention immediately by calling 911 or calling your MD immediately  if symptoms less severe.  You Must read complete instructions/literature along with all the possible adverse reactions/side effects for all the Medicines you take and that have been prescribed to you. Take any new Medicines after you have completely understood and accpet all the possible adverse reactions/side effects.

## 2021-07-04 NOTE — Assessment & Plan Note (Signed)
PT evaluation in the am Fall precautions.

## 2021-07-04 NOTE — ED Notes (Signed)
Hospitalist at bedside 

## 2021-07-04 NOTE — H&P (Signed)
History and Physical    Patient: Gary [name] TKZ:[id number] DOB: 1966/02/14 DOA: 07/04/2021 DOS: the patient was seen and examined on 07/04/2021 PCP: Jackie Plum, MD  Patient coming from: Home  Chief Complaint:  Chief Complaint  Patient presents with   Extremity Weakness   Dizziness    HPI: Gary [name] is a 56 y.o. male with medical history significant of Bipolar disorder, anxiety who presents for evaluation of  inability to walk.  Ports that for the first 52 years of his life he lives with his mother for the last few years his lives independently in an apartment.  He lives alone.  He has been having to walk with a cane for the last few months due to soreness in his right hip and weakness of his legs.  Weakness is progressively gotten worse and today he was not able to stand up or ambulate at all.  He has had his right hip replaced.  He reports he had a medical transport company coming to take him to [employer] to do his shopping but when they arrived he was not able to stand up or walk.  When he tried to walk his legs became very weak and was shaking.  He reports he has had some falls at home due to weakness in his legs last week but he has not hit his head or had loss of consciousness.  States he does get some "tingling" in his legs at times but is not persistent.  He denies any headache, neck injury or stiff neck.  He has not had any fever, chills, nausea vomiting diarrhea, chest pain or shortness of breath. Reports he has never had problems like this in the past. No hx of DVT/PE. Denies injury to back or neck.  Reports he has been able to urinate and have normal bowel movements.   In the emergency room he has been hemodynamically stable.  CT of the head showed no acute intracranial pathology but did show some old small vessel infarcts.  Was attempted in the emergency room and he was unable to stand on his own or walk at all.  Neurology has been consulted and will see patient to make  recommendations.  Hospitalist service asked to evaluate and manage patient  Review of Systems: As mentioned in the history of present illness. All other systems reviewed and are negative. Past Medical History:  Diagnosis Date   Anxiety    Bipolar 2 disorder (HCC)    Depression    GERD (gastroesophageal reflux disease)    Hyperhydrosis disorder    Past Surgical History:  Procedure Laterality Date   HERNIA REPAIR     HIP SURGERY Right    LEFT HEART CATH AND CORONARY ANGIOGRAPHY N/A 09/19/2018   Procedure: LEFT HEART CATH AND CORONARY ANGIOGRAPHY;  Surgeon: [name], MD;  Location: [facility] INVASIVE CV LAB;  Service: Cardiovascular;  Laterality: N/A;   Social History:  reports that he has never smoked. He has never used smokeless tobacco. He reports that he does not drink alcohol and does not use drugs.  Allergies  Allergen Reactions   Bee Venom Swelling    Swelling at site of sting   Erythromycin Diarrhea    History reviewed. No pertinent family history.  Prior to Admission medications   Medication Sig Start Date End Date Taking? Authorizing Provider  OLANZapine (ZYPREXA) 5 MG tablet Take 1 tablet (5 mg total) by mouth at bedtime. For mood 09/29/18  Yes Aldean Baker, NP  benztropine (COGENTIN) 0.5 MG tablet Take 1 tablet (0.5 mg total) by mouth 2 (two) times daily. For EPS Patient not taking: Reported on 07/04/2021 09/29/18   Aldean Baker, NP  escitalopram (LEXAPRO) 20 MG tablet Take 1 tablet (20 mg total) by mouth daily. For depression/anxiety Patient not taking: Reported on 07/04/2021 09/30/18   Aldean Baker, NP  famotidine (PEPCID) 20 MG tablet Take 1 tablet (20 mg total) by mouth daily. For reflux Patient not taking: Reported on 07/04/2021 09/30/18   Aldean Baker, NP  gabapentin (NEURONTIN) 400 MG capsule Take 1 capsule (400 mg total) by mouth 3 (three) times daily. Patient not taking: Reported on 07/04/2021 09/29/18   Aldean Baker, NP    Physical Exam: Vitals:    07/04/21 1845 07/04/21 1915 07/04/21 2015 07/04/21 2145  BP: 128/77 140/88 128/78 (!) 143/89  Pulse: 79 76 76 79  Resp: 15 13 14 14   Temp:      TempSrc:      SpO2: 96% 98% 96% 95%  Height:       General: WDWN, Alert and oriented x3.  Eyes: EOMI, PERRL, conjunctivae normal.  Sclera nonicteric HENT:  [place]/AT, external ears normal.  Nares patent without epistasis. Mucous membranes are moist. Posterior pharynx clear of any exudate Neck: Soft, normal range of motion, supple, no masses, Trachea midline Respiratory: clear to auscultation bilaterally, no wheezing, no crackles. Normal respiratory effort. No accessory muscle use.  Cardiovascular: Regular rate and rhythm, no murmurs / rubs / gallops. No extremity edema. 2+ pedal pulses.  Abdomen: Soft, no tenderness, nondistended, no rebound or guarding.  No masses palpated.  Bowel sounds normoactive Musculoskeletal: FROM.  Right hip sore with passive external and internal rotation. no cyanosis. No joint deformity upper and lower extremities. Normal muscle tone.  Skin: Warm, dry, intact no rashes, lesions, ulcers. No induration Neurologic: CN 2-12 grossly intact.  Normal speech.  Sensation intact, patella DTR +1 bilaterally. Strength 5/5 in upper extremities.  Strength 3/5 in lower extremities. No tremor. Normal 2 point discrimination. Psychiatric: Normal judgment and insight.  Anxious. Flat affect   Data Reviewed: Lab work shows a normal CBC.  Electrolytes and LFTs normal.  Creatinine 1.39.  COVID-negative.  Influenza A and B are negative  CT of the head shows no acute intracranial pathology.  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   Right hip x-ray with no fracture or dislocation.  Patient status post right total hip arthroplasty  EKG shows normal sinus rhythm with no acute ST elevation or depression.  QTc 426  Assessment and  Plan: * Ataxia [name] is placed on medical telemetry for observation.  Unable to ambulate at this time.  CT head negative for acute pathology Obtain MRI brain, MRI lumbar spine to further evaluate.  Neurology has been consulted and will evaluate pt.  Weakness of both legs PT evaluation in the am Fall precautions.  GAD (generalized anxiety disorder)- (present on admission) Continue home medications Ativan q 8 hr prn x 3 doses for severe anxiety ordered   Bipolar affective disorder, currently active (HCC)- (present on admission) Continue zyprexa qhs   Advance Care Planning: CODE STATUS: Full code   DVT prophylaxis: Lovenox  Consults: Neurology consulted by ER physician will see patient and provide recommendations  Family Communication: Diagnosis and plan discussed with patient.  Patient verbalized understanding and agrees to plan.  Further recommendations to follow as clinically indicated   Author: Carlton Adam, MD 07/04/2021 10:05 PM  For on call review [email or web address].

## 2021-07-04 NOTE — ED Notes (Signed)
This RN to ambulate pt per order. Pt continues to state "I can't walk" with much encouragement this RN to assist pt with ambulation. Pt out of bed using cane, RN accompanies, shuffling/slow gait with cane assist, pt tells this nurse, this is normal for him, pt then began having severe tremors of arms and legs, pt appears to be anxious, this RN able to assist pt back to bed . Pt states "I can't go home, I can't walk" This RN to notify EDP

## 2021-07-04 NOTE — Assessment & Plan Note (Addendum)
Continue home medications Ativan q 8 hr prn x 3 doses for severe anxiety ordered

## 2021-07-04 NOTE — ED Provider Notes (Signed)
[name] EMERGENCY DEPARTMENT Provider Note   CSN: [id number] Arrival date & time: 07/04/21  1601     History  Chief Complaint  Patient presents with   Extremity Weakness   Dizziness    Gary [name] is a 56 y.o. male.  HPI 56 year old male with history of depression, generalized anxiety disorder, bipolar disorder, who states that he lives independently although he lives at home for his first 83 years comes in today complaining of some generalized weakness, difficulty walking, and right hip pain.  He states that he lives in apartment by himself and walks with a cane.  He feels he has been having some difficulty walking due to some pain in his right hip.  He has had previous hip replacement on that side.  He states that the medical transport came today to take him to [place].  He had difficulty walking and the man had to help him.  He called EMS and came into the ED secondary to this.  He denies any specific balance issues, headache, head injury.  He denies any neck pain or neck injury.  He states that he is having pain in his right hip.     Home Medications Prior to Admission medications   Medication Sig Start Date End Date Taking? Authorizing Provider  OLANZapine (ZYPREXA) 5 MG tablet Take 1 tablet (5 mg total) by mouth at bedtime. For mood 09/29/18  Yes Connye Burkitt, NP  benztropine (COGENTIN) 0.5 MG tablet Take 1 tablet (0.5 mg total) by mouth 2 (two) times daily. For EPS Patient not taking: Reported on 07/04/2021 09/29/18   Connye Burkitt, NP  escitalopram (LEXAPRO) 20 MG tablet Take 1 tablet (20 mg total) by mouth daily. For depression/anxiety Patient not taking: Reported on 07/04/2021 09/30/18   Connye Burkitt, NP  famotidine (PEPCID) 20 MG tablet Take 1 tablet (20 mg total) by mouth daily. For reflux Patient not taking: Reported on 07/04/2021 09/30/18   Connye Burkitt, NP  gabapentin (NEURONTIN) 400 MG capsule Take 1 capsule (400 mg total) by mouth 3 (three) times  daily. Patient not taking: Reported on 07/04/2021 09/29/18   Connye Burkitt, NP      Allergies    Bee venom and Erythromycin    Review of Systems   Review of Systems  All other systems reviewed and are negative.  Physical Exam Updated Vital Signs BP 128/78    Pulse 76    Temp 99.1 F (37.3 C) (Oral)    Resp 14    Ht 1.6 m (_0 )    SpO2 96%    BMI 21.97 kg/m  Physical Exam Vitals and nursing note reviewed.  Constitutional:      Appearance: Normal appearance.  HENT:     Head: Normocephalic.     Right Ear: External ear normal.     Left Ear: External ear normal.     Nose: Nose normal.     Mouth/Throat:     Pharynx: Oropharynx is clear.  Eyes:     Pupils: Pupils are equal, round, and reactive to light.  Cardiovascular:     Rate and Rhythm: Normal rate.  Pulmonary:     Effort: Pulmonary effort is normal.     Breath sounds: Normal breath sounds.  Abdominal:     General: Abdomen is flat.     Palpations: Abdomen is soft.  Musculoskeletal:        General: Normal range of motion.     Cervical  back: Normal range of motion and neck supple.  Skin:    General: Skin is warm.     Capillary Refill: Capillary refill takes less than 2 seconds.  Neurological:     Mental Status: He is alert.     Comments: Unable to do right heel to shin- unclear if due to hip or cns Patient very tremulous and unable to walk-   Psychiatric:        Mood and Affect: Mood normal.    ED Results / Procedures / Treatments   Labs (all labs ordered are listed, but only abnormal results are displayed) Labs Reviewed  COMPREHENSIVE METABOLIC PANEL - Abnormal; Notable for the following components:      Result Value   Glucose, Bld 127 (*)    Creatinine, Ser 1.39 (*)    GFR, Estimated 59 (*)    All other components within normal limits  CBG MONITORING, ED - Abnormal; Notable for the following components:   Glucose-Capillary 122 (*)    All other components within normal limits  RESP PANEL BY RT-PCR (FLU A&B,  COVID) ARPGX2  CBC    EKG None  Radiology DG Chest 1 View  Result Date: 07/04/2021 CLINICAL DATA:  Weakness.  Dizziness. EXAM: CHEST  1 VIEW COMPARISON:  04/04/2020 FINDINGS: Heart size and mediastinal contours are unremarkable. No pleural effusion or edema identified. No airspace opacities. Visualized osseous structures are unremarkable. IMPRESSION: No acute cardiopulmonary abnormalities. Electronically Signed   By: Kerby Moors M.D.   On: 07/04/2021 18:13   CT Head Wo Contrast  Result Date: 07/04/2021 CLINICAL DATA:  Neuro deficit, acute, stroke suspected. EXAM: CT HEAD WITHOUT CONTRAST TECHNIQUE: Contiguous axial images were obtained from the base of the skull through the vertex without intravenous contrast. RADIATION DOSE REDUCTION: This exam was performed according to the departmental dose-optimization program which includes automated exposure control, adjustment of the mA and/or kV according to patient size and/or use of iterative reconstruction technique. COMPARISON:  05/16/2017 FINDINGS: Brain: No abnormality seen affecting the brainstem or cerebellum. Cerebral hemispheres are normal except for focal low-density in the external capsule on the right consistent with small vessel infarction, age indeterminate. 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 No mass lesion, hemorrhage, hydrocephalus or extra-axial collection. Vascular: No abnormal vascular finding. Skull: Normal Sinuses/Orbits: Clear/normal Other: None IMPRESSION: No acute finding by CT. 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 Electronically Signed   By: Nelson Chimes M.D.   On: 07/04/2021 18:26   DG Hip Unilat W or Wo Pelvis 2-3 Views Right  Result Date: 07/04/2021 CLINICAL DATA:  Right hip arthroplasty. Patient complains of pain in the right hip. EXAM: DG HIP (WITH OR WITHOUT  PELVIS) 2-3V RIGHT COMPARISON:  09/16/2018. FINDINGS: Postsurgical changes from right total hip arthroplasty identified. No signs of periprosthetic fracture or dislocation. Heterotopic bone formation is identified lateral to the femoral neck. Left hip appears unremarkable. IMPRESSION: Status post right total hip arthroplasty. No fracture or dislocation identified. Electronically Signed   By: Kerby Moors M.D.   On: 07/04/2021 18:19    Procedures Procedures    Medications Ordered in ED Medications - No data to display  ED Course/ Medical Decision Making/ A&P Clinical Course as of 07/04/21 2135  Sat Jul 04, 2021  2106 CBC CBC reviewed and within normal limits [DR]  2106 Comprehensive metabolic panel(!) C-Met reviewed and mildly  elevated creatinine at 1.39 without significant change from prior creatinines  [DR]  2106 CT Head Wo Contrast CT head, hip, and chest personally reviewed CT head without any evidence of acute bleeding or abnormality Right hip status post arthroplasty without any evidence of fracture Chest x-Lizvette Lightsey is clear [DR]    Clinical Course User Index [DR] Pattricia Boss, MD                           Medical Decision Making 56 year old male presents today with right hip pain and difficulty walking.  Patient with decreased right heel-to-shin. Attempted to ambulate patient and unable to walk due to tremulousness and some ataxia. Patient states he has had some symptoms for months but he is acutely unable to walk today. Plan admission for further evaluation and MRI   Amount and/or Complexity of Data Reviewed External Data Reviewed: notes.    Details: Previous hospital admissions reviewed Labs: ordered. Decision-making details documented in ED Course. Radiology: ordered. Decision-making details documented in ED Course.  Risk Decision regarding hospitalization. Risk Details: Discussed with Dr. Cheral Marker who will see in consult for neurology Discussed with Dr. Tonie Griffith who  will see for hospitalist and evaluate    Final Clinical Impression(s) / ED Diagnoses Final diagnoses:  Hip pain  Ataxia  Right hip pain    Rx / DC Orders ED Discharge Orders     None         Pattricia Boss, MD 07/04/21 2136

## 2021-07-04 NOTE — Assessment & Plan Note (Signed)
Gary [name] is placed on medical telemetry for observation.  Unable to ambulate at this time.  CT head negative for acute pathology Obtain MRI brain, MRI lumbar spine to further evaluate.  Neurology has been consulted and will evaluate pt.

## 2021-07-04 NOTE — Assessment & Plan Note (Signed)
Continue zyprexa qhs

## 2021-07-04 NOTE — ED Triage Notes (Addendum)
Pt to ED via EMS from home c/o leg weakness bilat and light headiness ongoing for past 3 months, has started to have to use a cane . Reports weakness is getting worse. Hx right hip replacement. Last VS: 150/100, P 98, 05%ra cbg 208.

## 2021-07-04 NOTE — ED Notes (Signed)
Pt noted to have involuntary tremors of arms and legs. Pt tells this nurse, this is normal for him and has anxiety.

## 2021-07-05 ENCOUNTER — Other Ambulatory Visit: Payer: Self-pay

## 2021-07-05 DIAGNOSIS — G20 Parkinson's disease: Secondary | ICD-10-CM

## 2021-07-05 DIAGNOSIS — R42 Dizziness and giddiness: Secondary | ICD-10-CM | POA: Diagnosis not present

## 2021-07-05 DIAGNOSIS — R270 Ataxia, unspecified: Principal | ICD-10-CM

## 2021-07-05 DIAGNOSIS — R29898 Other symptoms and signs involving the musculoskeletal system: Secondary | ICD-10-CM | POA: Diagnosis not present

## 2021-07-05 LAB — BASIC METABOLIC PANEL
Anion gap: 8 (ref 5–15)
BUN: 16 mg/dL (ref 6–20)
CO2: 27 mmol/L (ref 22–32)
Calcium: 9 mg/dL (ref 8.9–10.3)
Chloride: 101 mmol/L (ref 98–111)
Creatinine, Ser: 1.27 mg/dL — ABNORMAL HIGH (ref 0.61–1.24)
GFR, Estimated: >60 mL/min (ref >60)
Glucose, Bld: 119 mg/dL — ABNORMAL HIGH (ref 70–99)
Potassium: 3.3 mmol/L — ABNORMAL LOW (ref 3.5–5.1)
Sodium: 136 mmol/L (ref 135–145)

## 2021-07-05 LAB — CBC
HCT: 38.9 % — ABNORMAL LOW (ref 39.0–52.0)
Hemoglobin: 13.2 g/dL (ref 13.0–17.0)
MCH: 31.8 pg (ref 26.0–34.0)
MCHC: 33.9 g/dL (ref 30.0–36.0)
MCV: 93.7 fL (ref 80.0–100.0)
Platelets: 228 10*3/uL (ref 150–400)
RBC: 4.15 MIL/uL — ABNORMAL LOW (ref 4.22–5.81)
RDW: 12.5 % (ref 11.5–15.5)
WBC: 4.7 10*3/uL (ref 4.0–10.5)
nRBC: 0 % (ref 0.0–0.2)

## 2021-07-05 LAB — TSH: TSH: 3.033 u[IU]/mL (ref 0.350–4.500)

## 2021-07-05 IMAGING — MR MR HEAD W/O CM
10 of 11 series · 42 of 48 positions shown · non-contrast
Comparison: Prior CT from [DATE].

CLINICAL DATA: Initial evaluation for neuro deficit, stroke
suspected.

EXAM:
MRI HEAD WITHOUT CONTRAST
TECHNIQUE: Multiplanar, multiecho pulse sequences of the brain and surrounding
structures were obtained without intravenous contrast.

[Series 5: DWI · axial · 3.0mm · 0.88mm/px · z∈[-81,+60]mm · 9 of 96 slices shown (1 of 4)]
[im 1/96]
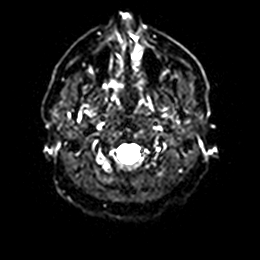
[im 12/96]
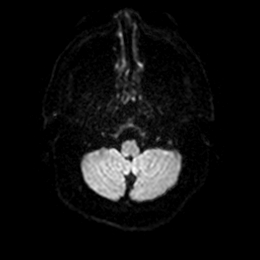
[im 24/96]
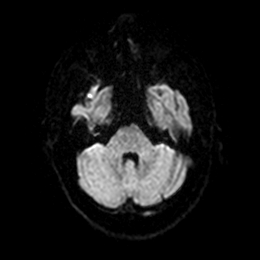
[im 36/96]
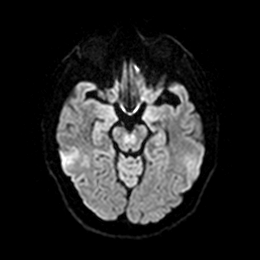
[im 48/96]
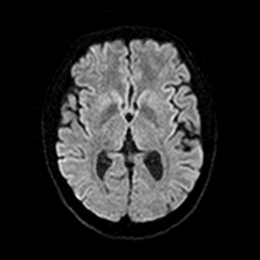
[im 60/96]
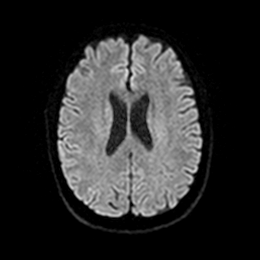
[im 72/96]
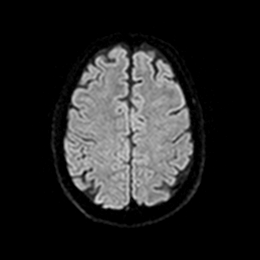
[im 84/96]
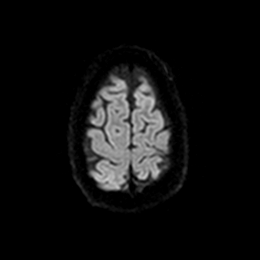
[im 96/96]
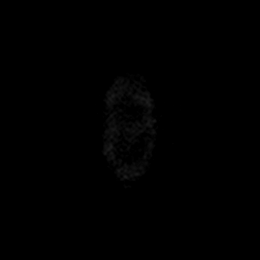

[Series 6: DWI · axial · 3.0mm · 0.88mm/px · z∈[-81,+60]mm · 4 of 47 slices shown (2 of 4)]
[im 1/47]
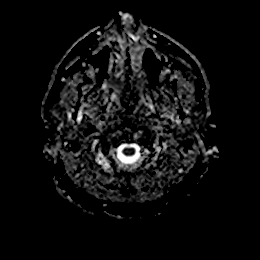
[im 16/47]
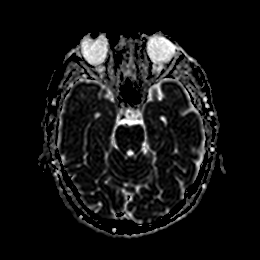
[im 31/47]
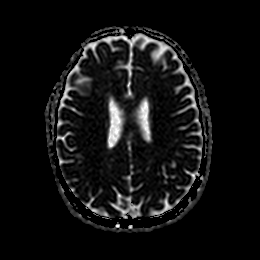
[im 47/47]
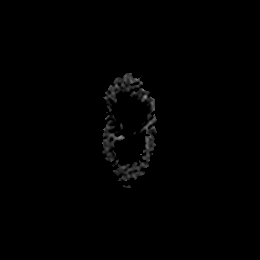

[Series 7: DWI · coronal · 4.0mm · 0.88mm/px · 6 of 66 slices shown (3 of 4)]
[im 1/66]
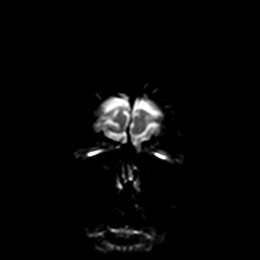
[im 14/66]
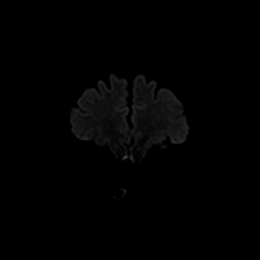
[im 27/66]
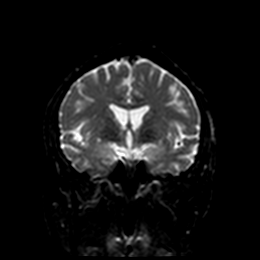
[im 40/66]
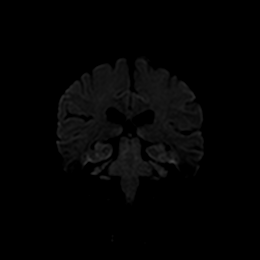
[im 53/66]
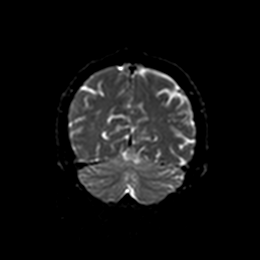
[im 66/66]
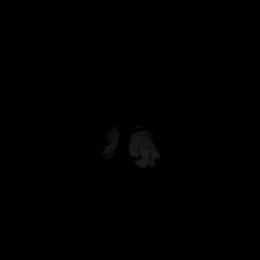

[Series 8: DWI · coronal · 4.0mm · 0.88mm/px · 3 of 33 slices shown (4 of 4)]
[im 1/33]
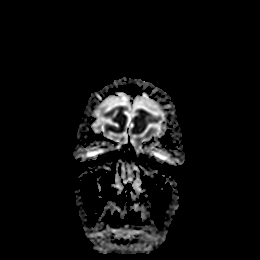
[im 17/33]
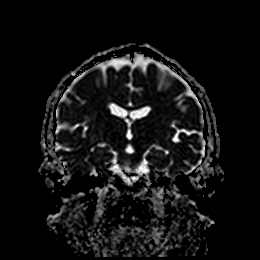
[im 33/33]
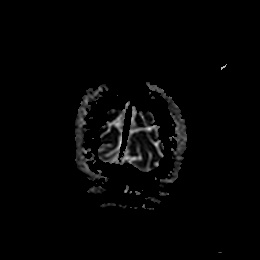

[Series 9: T1 · sagittal · 5.0mm · 0.75mm/px · 2 of 23 slices shown]
[im 1/23]
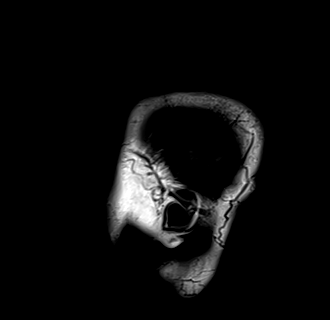
[im 23/23]
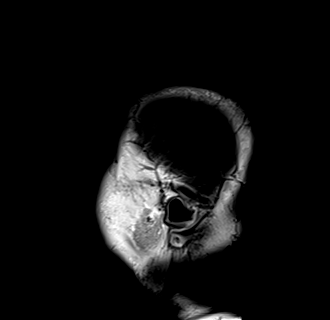

[Series 10: T2 · axial · 5.0mm · 0.72mm/px · z∈[-82,+62]mm · 2 of 25 slices shown (1 of 2)]
[im 1/25]
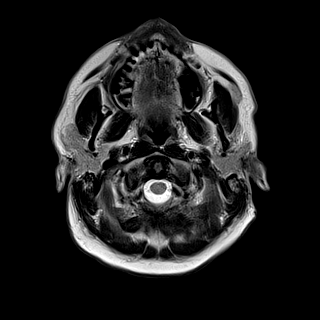
[im 25/25]
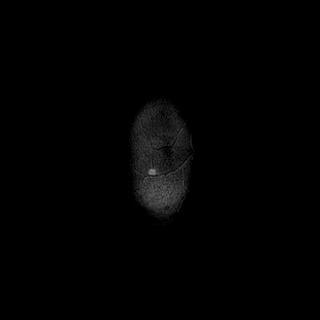

[Series 11: FLAIR · axial · 5.0mm · 0.45mm/px · z∈[-83,+61]mm · 2 of 25 slices shown]
[im 1/25]
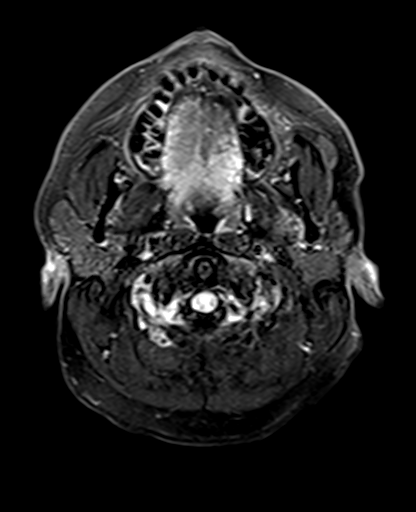
[im 25/25]
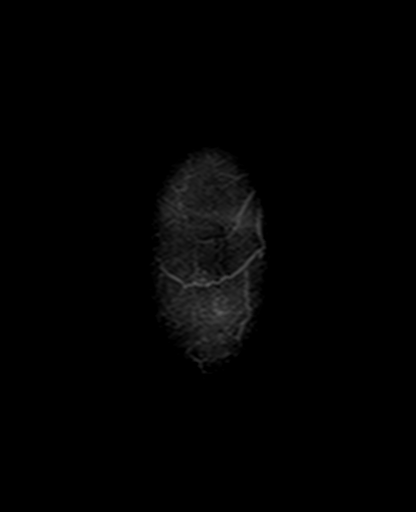

[Series 13: pha_images · axial · 3.0mm · 0.90mm/px · z∈[-98,+72]mm · 5 of 57 slices shown]
[im 1/57]
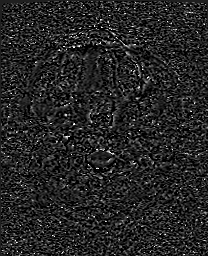
[im 15/57]
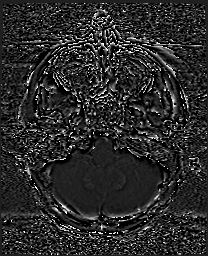
[im 29/57]
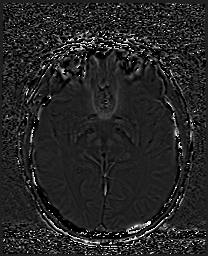
[im 43/57]
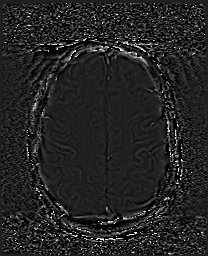
[im 57/57]
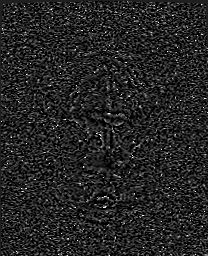

[Series 14: swi_images · axial · 3.0mm · 0.90mm/px · z∈[-98,+78]mm · 6 of 60 slices shown]
[im 1/60]
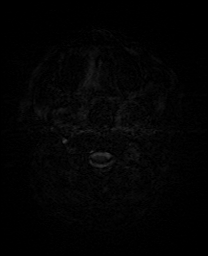
[im 12/60]
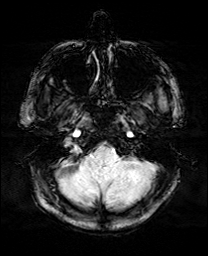
[im 24/60]
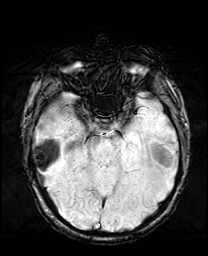
[im 36/60]
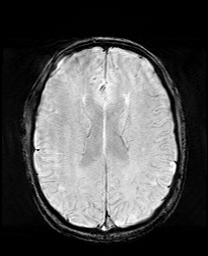
[im 48/60]
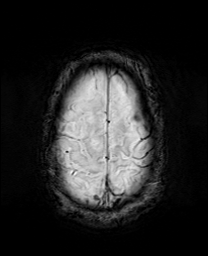
[im 60/60]
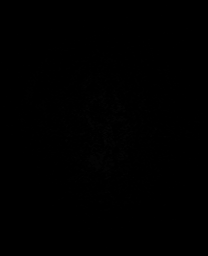

[Series 17: T2 · coronal · 5.0mm · 0.34mm/px · 3 of 29 slices shown (2 of 2)]
[im 1/29]
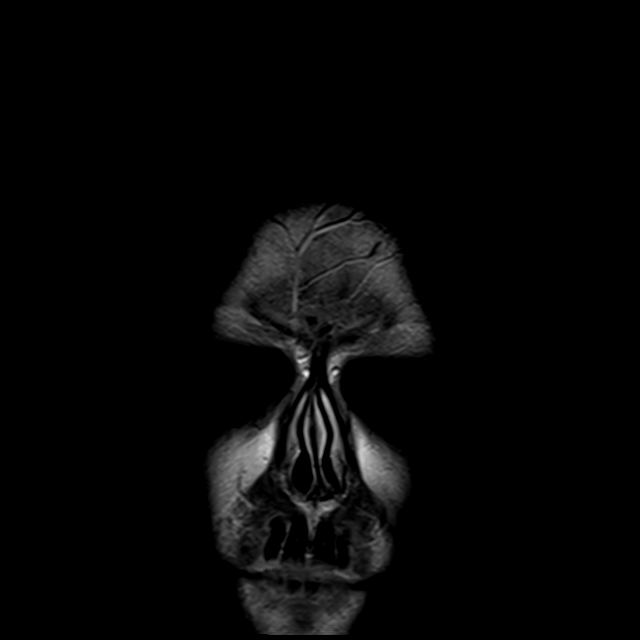
[im 15/29]
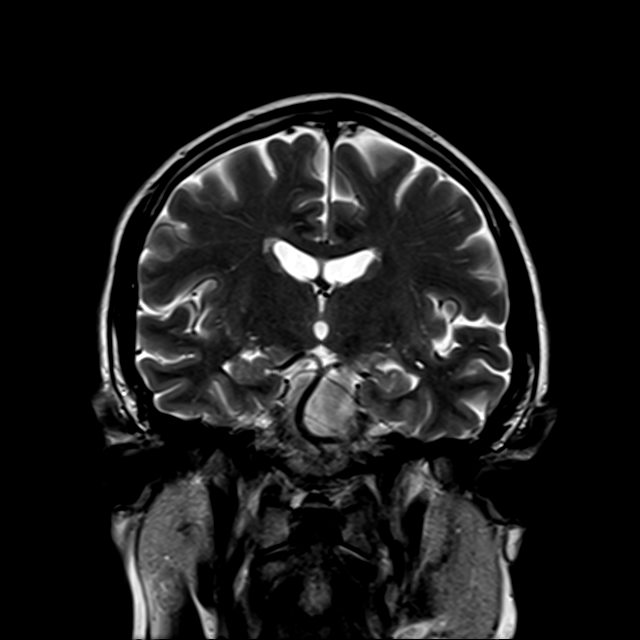
[im 29/29]
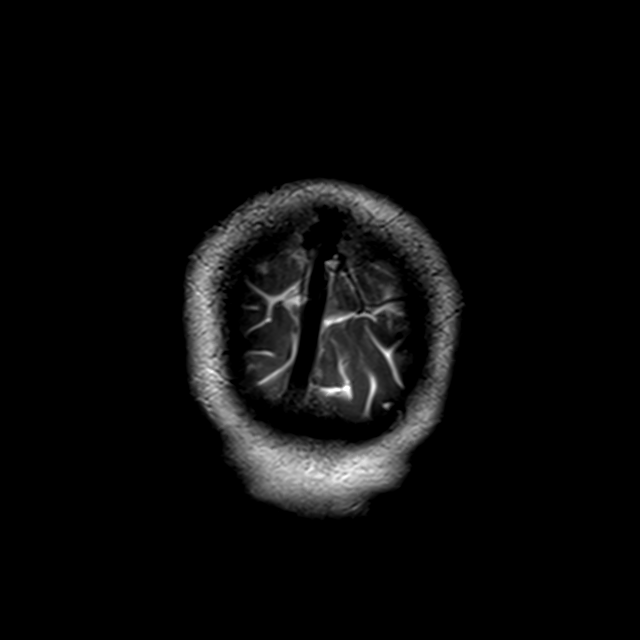

[42 of 48 positions shown; findings below may reference images not displayed]

FINDINGS: Brain: Cerebral volume within normal limits. Few scattered patchy
foci of subcentimeter T2/FLAIR hyperintensity seen involving the
periventricular, deep, and subcortical white matter both cerebral
hemispheres, nonspecific, but most commonly related to chronic
microvascular ischemic disease. Overall, appearance is mild for age.

No evidence for acute or subacute infarct. Gray-white matter
differentiation maintained. No encephalomalacia to suggest chronic
cortical infarction. No acute or chronic intracranial blood
products.

No mass lesion, midline shift or mass effect. No hydrocephalus or
extra-axial fluid collection. Pituitary gland and suprasellar region
within normal limits. Midline structures intact.

Vascular: Major intracranial vascular flow voids are maintained.

Skull and upper cervical spine: Craniocervical junction normal. Bone
marrow signal intensity within normal limits. No scalp soft tissue
abnormality.

Sinuses/Orbits: Globes orbital soft tissues within normal limits.
Paranasal sinuses are largely clear. No mastoid effusion.

Other: None.
IMPRESSION: 1. No acute intracranial abnormality.
2. Mild cerebral white matter disease, nonspecific, but most
commonly related to chronic microvascular ischemic changes.

## 2021-07-05 IMAGING — MR MR LUMBAR SPINE W/O CM
4 of 5 series · 26 of 48 positions shown · non-contrast
Comparison: None.

CLINICAL DATA: Initial evaluation for acute ataxia.

EXAM:
MRI LUMBAR SPINE WITHOUT CONTRAST
TECHNIQUE: Multiplanar, multisequence MR imaging of the lumbar spine was
performed. No intravenous contrast was administered.

[Series 9: T2 · sagittal · 4.0mm · 0.73mm/px · 6 of 17 slices shown (1 of 2)]
[im 1/17]
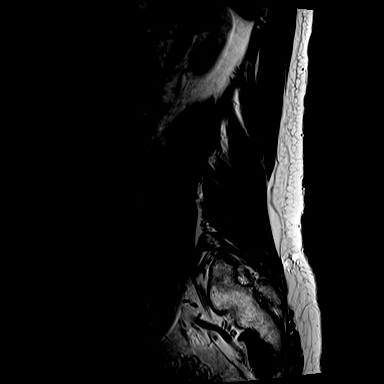
[im 4/17]
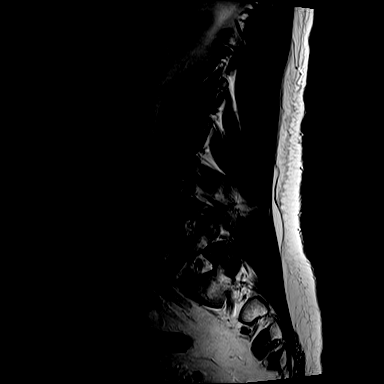
[im 7/17]
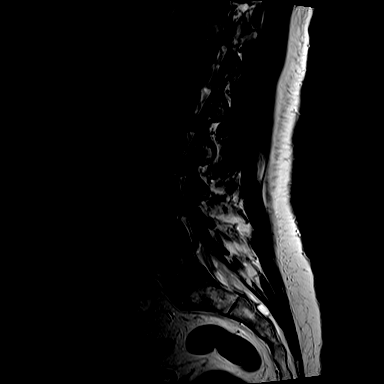
[im 10/17]
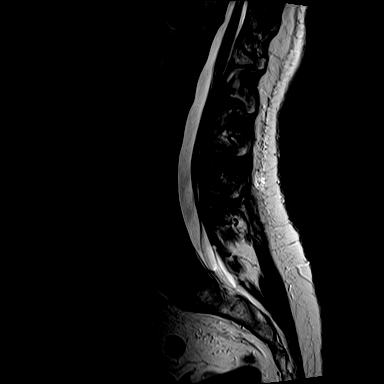
[im 13/17]
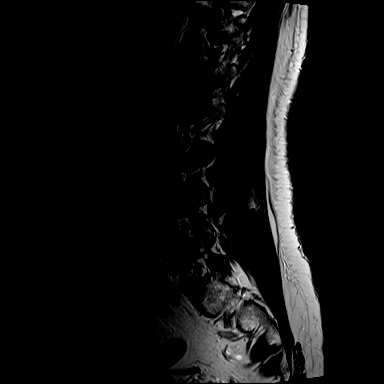
[im 17/17]
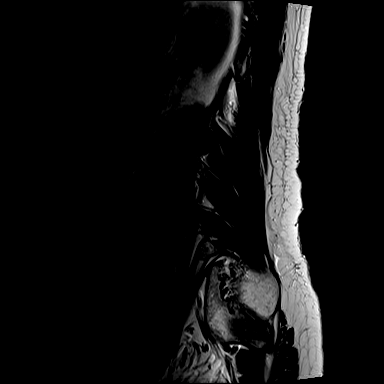

[Series 11: T1 · sagittal · 4.0mm · 0.88mm/px · 7 of 17 slices shown (1 of 2)]
[im 1/17]
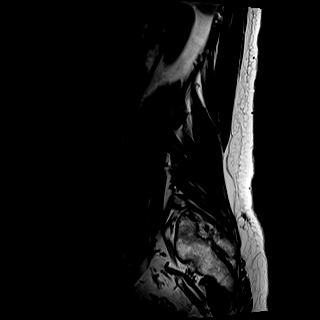
[im 3/17]
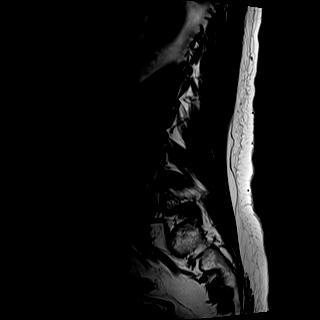
[im 6/17]
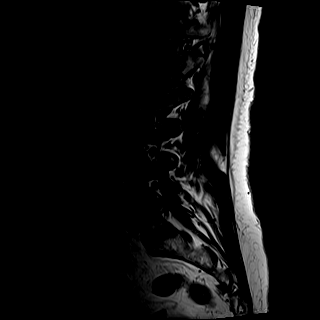
[im 9/17]
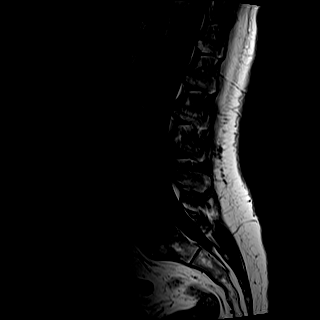
[im 11/17]
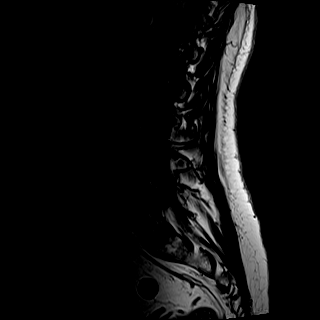
[im 14/17]
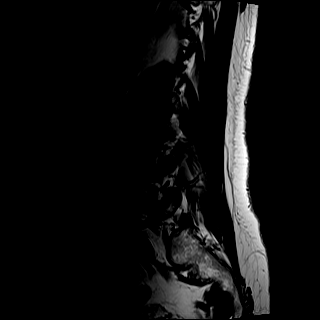
[im 17/17]
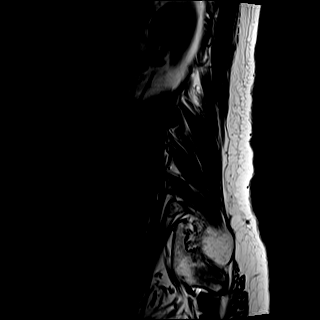

[Series 12: T2 · axial · 4.0mm · 0.57mm/px · z∈[-126,+70]mm · 8 of 37 slices shown (2 of 2)]
[im 1/37]
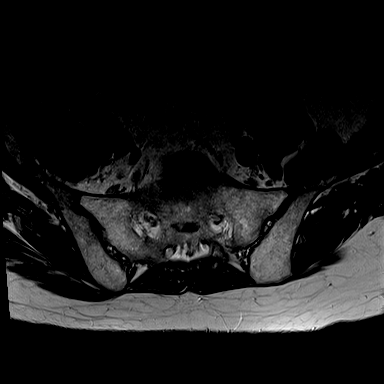
[im 6/37]
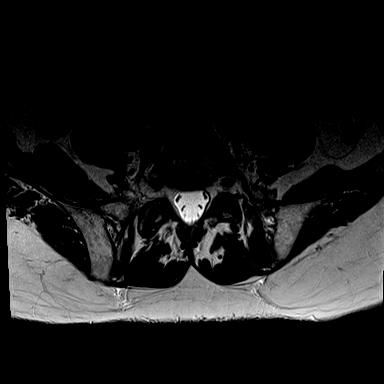
[im 12/37]
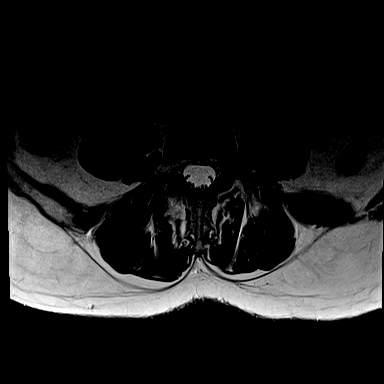
[im 17/37]
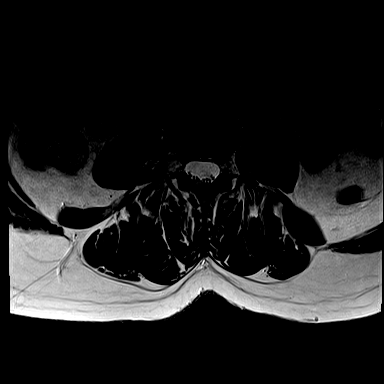
[im 20/37]
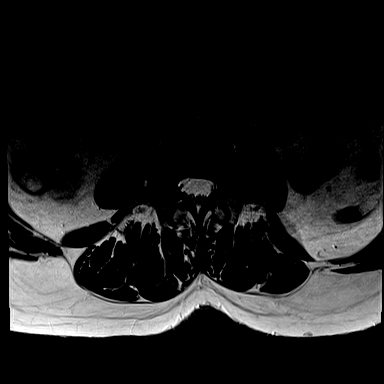
[im 25/37]
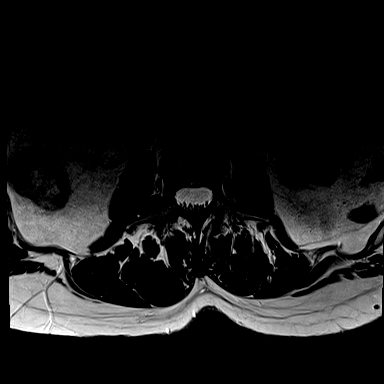
[im 31/37]
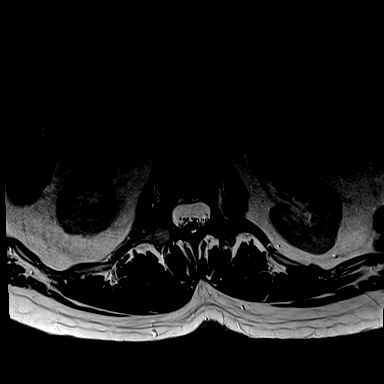
[im 37/37]
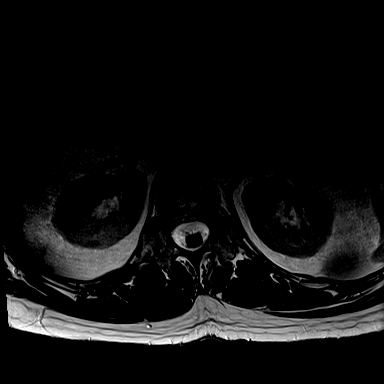

[Series 13: T1 · axial · 4.0mm · 0.34mm/px · z∈[-126,+40]mm · 5 of 37 slices shown (2 of 2)]
[im 1/37]
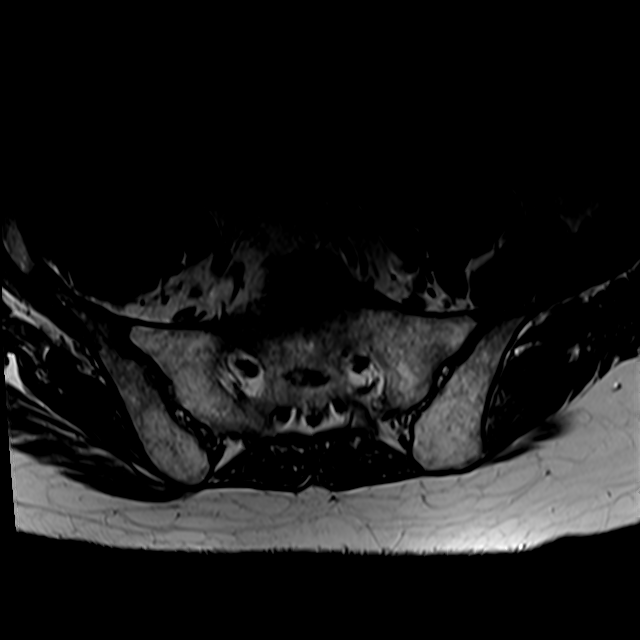
[im 6/37]
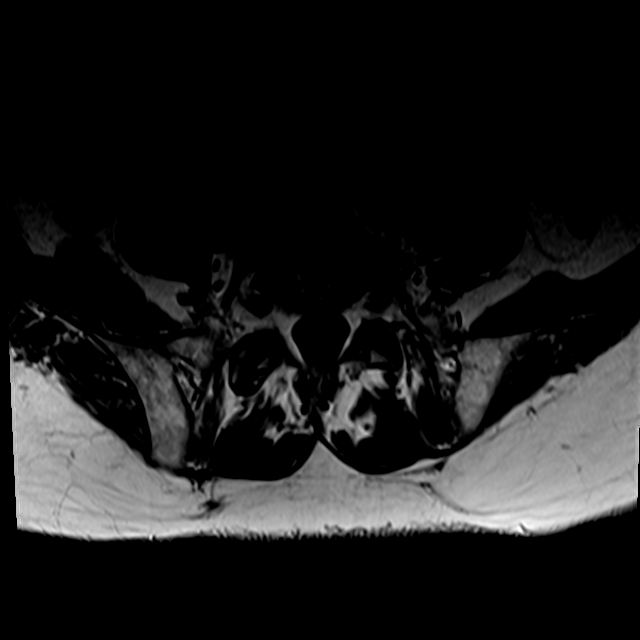
[im 12/37]
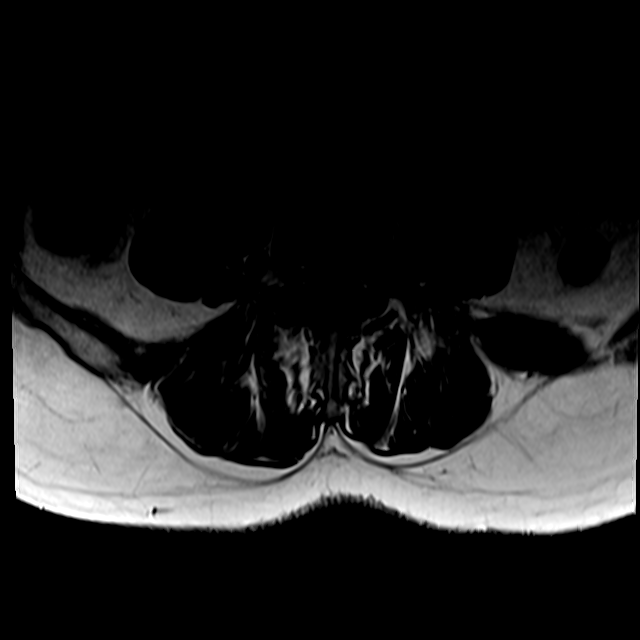
[im 20/37]
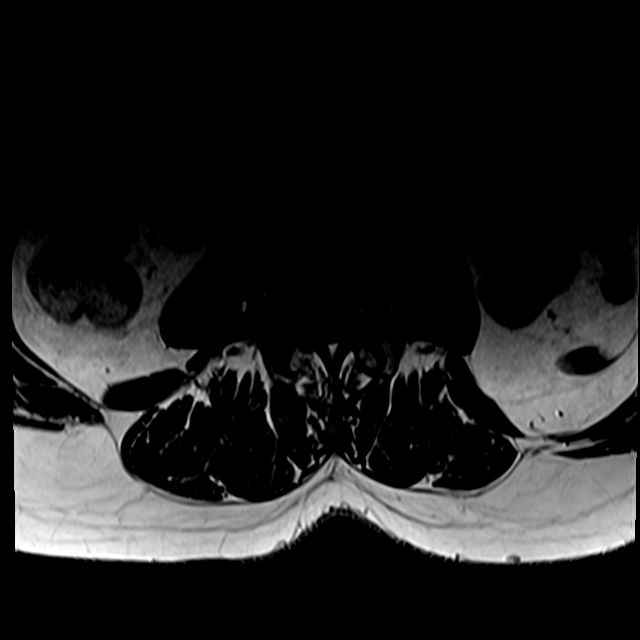
[im 31/37]
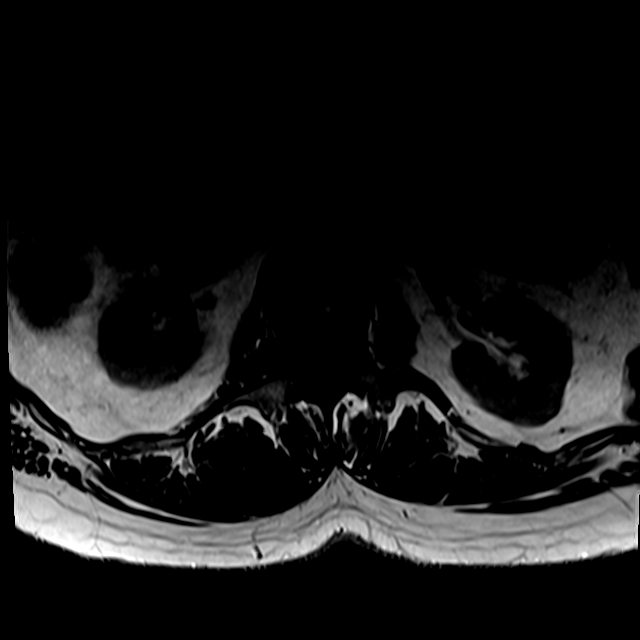

[26 of 48 positions shown; findings below may reference images not displayed]

FINDINGS: Segmentation: Standard. Lowest well-formed disc space labeled the
L5-S1 level.

Alignment: Mild dextroscoliosis. Alignment otherwise normal with
preservation of the normal lumbar lordosis. No listhesis.

Vertebrae: Vertebral body height maintained without acute or chronic
fracture. Bone marrow signal intensity mildly heterogeneous but
overall within normal limits. No worrisome osseous lesions or
abnormal marrow edema.

Conus medullaris and cauda equina: Conus extends to the L1-2 level.
Conus and cauda equina appear normal.

Paraspinal and other soft tissues: Paraspinous soft tissues within
normal limits. Left-sided IVC noted. Visualized visceral structures
otherwise unremarkable.

Disc levels:

T11-12: Seen only on sagittal projection. Disc desiccation with mild
disc bulge. No significant stenosis.

T12-L1: Unremarkable.

L1-2:  Unremarkable.

L2-3:  Negative interspace.  Mild facet hypertrophy.  No stenosis.

L3-4:  Negative interspace.  Mild facet hypertrophy.  No stenosis.

L4-5: Negative interspace. Mild right greater than left facet
hypertrophy. No stenosis.

L5-S1: Negative interspace. Moderate right with mild left facet
hypertrophy. No stenosis.
IMPRESSION: 1. No acute abnormality within the lumbar spine. No significant disc
pathology, stenosis, or evidence for neural impingement.
2. Mild-to-moderate multilevel facet hypertrophy, most notable at
L5-S1 on the right. Finding could contribute to underlying back
pain.

## 2021-07-05 MED ORDER — VITAMIN B-12 1000 MCG PO TABS
1000.0000 ug | ORAL_TABLET | Freq: Every day | ORAL | Status: DC
  Administered 2021-07-08 – 2021-07-09 (×2): 1000 ug via ORAL
  Filled 2021-07-05 (×2): qty 1

## 2021-07-05 MED ORDER — POTASSIUM CHLORIDE CRYS ER 20 MEQ PO TBCR
40.0000 meq | EXTENDED_RELEASE_TABLET | Freq: Once | ORAL | Status: AC
  Administered 2021-07-05: 40 meq via ORAL
  Filled 2021-07-05: qty 2

## 2021-07-05 MED ORDER — CYANOCOBALAMIN 1000 MCG/ML IJ SOLN
1000.0000 ug | Freq: Every day | INTRAMUSCULAR | Status: AC
  Administered 2021-07-05 – 2021-07-07 (×3): 1000 ug via INTRAMUSCULAR
  Filled 2021-07-05 (×3): qty 1

## 2021-07-05 NOTE — Progress Notes (Signed)
07/05/2021 patient transfer from the emergency room to 5 north at 1845. He is alert, oriented to person, place and time. Patient was placed on telemetry and central telemetry was called. He is a fall risk and bed alarm was activated. [facility] RN.

## 2021-07-05 NOTE — TOC Initial Note (Signed)
Transition of Care [facility]) - Initial/Assessment Note    Patient Details  Name: Gary [name] MRN: [id number] Date of Birth: Jul 20, 1965  Transition of [facility] CM/SW Contact:    Lockie Pares, RN Phone Number: 07/05/2021, 1:36 PM  Clinical Narrative:                 Patient presented to ED with progressive weakness of lower extremities, testing reveals no acute process, likely due to deconditioning. Patient lives alone in a apartment. CSW consult for possibility of placement, PT is consulted for recommendations.  TOC will follow for needs, recommendations, and transitions.  Expected Discharge Plan: Skilled Nursing Facility Barriers to Discharge: Continued Medical Work up   Patient Goals and CMS Choice        Expected Discharge Plan and Services Expected Discharge Plan: Skilled Nursing Facility In-house Referral: Clinical Social Work     Living arrangements for the past 2 months: Apartment                                      Prior Living Arrangements/Services Living arrangements for the past 2 months: Apartment Lives with:: Self Patient language and need for interpreter reviewed:: Yes        Need for Family Participation in Patient Care: Yes (Comment) Care giver support system in place?: Yes (comment) Current home services: D[facility]) Criminal Activity/Legal Involvement Pertinent to Current Situation/Hospitalization: No - Comment as needed  Activities of Daily Living      Permission Sought/Granted                  Emotional Assessment       Orientation: : Oriented to Self, Oriented to Place, Oriented to  Time Alcohol / Substance Use: Not Applicable Psych Involvement: No (comment)  Admission diagnosis:  Ataxia [R27.0] Patient Active Problem List   Diagnosis Date Noted   Ataxia 07/04/2021   Weakness of both legs 07/04/2021   MDD (major depressive disorder) 09/25/2018   GERD (gastroesophageal reflux disease)    Bipolar affective disorder,  currently active (HCC) 05/16/2017   Severe recurrent major depression without psychotic features (HCC) 11/06/2015   GAD (generalized anxiety disorder) 11/06/2015   PCP:  Jackie Plum, MD Pharmacy:   [facility] [place], [place] - [phone] [place] [address] Phone: [phone] Fax: [phone]  [facility] [address] [place] [address] Phone: [phone] Fax: [phone]  [facility] - [place], [place] - [address] [place] [address] Phone: [phone] Fax: [phone]     Social Determinants of Health (SDOH) Interventions    Readmission Risk Interventions No flowsheet data found.

## 2021-07-05 NOTE — ED Notes (Signed)
Patient transported to MRI 

## 2021-07-05 NOTE — ED Notes (Signed)
Regular lunch tray ordered 

## 2021-07-05 NOTE — Progress Notes (Signed)
PROGRESS NOTE                                                                                                                                                                                                             Patient Demographics:    Gary [name], is a 56 y.o. male, DOB - [birth year], UEA:[id number]  Outpatient Primary [name] for the patient is Gary [name]    LOS - 0  Admit date - 07/04/2021    Chief Complaint  Patient presents with   Extremity Weakness   Dizziness       Brief Narrative (HPI from H&P)  56 y.o. male with medical history significant of Bipolar disorder, anxiety who presents for evaluation of  inability to walk.  He for the last few years his lives independently in an apartment.  He lives alone.  He has been having to walk with a cane for the last few months due to soreness in his right hip and weakness of his legs.  Weakness is progressively gotten worse and today he was not able to stand up or ambulate at all.  He has had his right hip replaced.  Upon further interview he informs us that he has been having gradually progressive lower extremity weakness affecting both legs for the last several months to the point that now he is unable to stand up.  In the ER he was seen by neurology for progressive lower extremity weakness and admitted to the hospital for further care   Subjective:    Gary [name] today has, No headache, No chest pain, No abdominal pain - No Nausea, No new weakness tingling or numbness, no SOB.   Assessment  & Plan :     Bilateral lower extremity weakness with ataxia which is gradually progressive over several months in a patient with history of right hip replacement in the past - his symptoms are gradually progressive and do not sound acute however he lives alone and currently unable to stand or ambulate by himself without assistance.  Unsafe discharge.  MRI brain and L-spine are  nonacute, x-ray of the right hip nonacute.  His B12 level is borderline low and will be initiated on replacement for the same, neurology has seen the patient and his neuropathy work-up is underway.  Will advance activity-seen by PT OT.  2.  Borderline low B12 levels.  Replace check methylmalonic acid levels.  3.  Generalized anxiety disorder and bipolar disorder.  Continue combination of Zyprexa and his other home medications.  No acute issue.  Encouraged the patient to sit up in chair in the daytime use I-S and flutter valve for pulmonary toiletry.       Condition - Fair  Family Communication  :  None present  Code Status :  Full  Consults  :  Neuro  PUD Prophylaxis :    Procedures  :     MRI Brain - Non Acute  MRI L spine -  1. No acute abnormality within the lumbar spine. No significant disc pathology, stenosis, or evidence for neural impingement. 2. Mild-to-moderate multilevel facet hypertrophy, most notable at L5-S1 on the right. Finding could contribute to underlying back pain.       Disposition Plan  :    Status is: Observation   DVT Prophylaxis  :    enoxaparin (LOVENOX) injection 40 mg Start: 07/05/21 1000    Lab Results  Component Value Date   PLT 228 07/05/2021    Diet :  Diet Order             Diet regular Room service appropriate? Yes; Fluid consistency: Thin  Diet effective now                    Inpatient Medications  Scheduled Meds:  cyanocobalamin  1,000 mcg Subcutaneous Daily   enoxaparin (LOVENOX) injection  40 mg Subcutaneous Daily   escitalopram  20 mg Oral Daily   OLANZapine  5 mg Oral QHS   sodium chloride flush  3 mL Intravenous Q12H   [START ON 07/08/2021] vitamin B-12  1,000 mcg Oral Daily   Continuous Infusions:  sodium chloride     PRN Meds:.sodium chloride, acetaminophen **OR** acetaminophen, LORazepam, sodium chloride flush  Antibiotics  :    Anti-infectives (From admission, onward)    None        Time Spent  in minutes  30   Gary [name] on 07/05/2021 at 9:16 AM  To page go to [email or web address]   [facility] -  Office  [phone]  See all Orders from today for further details    Objective:   Vitals:   07/05/21 0500 07/05/21 0600 07/05/21 0630 07/05/21 0700  BP: 96/65 106/70 107/71 110/81  Pulse: 66 66 66 67  Resp: 16 13 13 14   Temp:      TempSrc:      SpO2: 94% 95% 95% 96%  Height:        Wt Readings from Last 3 Encounters:  04/04/20 56.2 kg  09/25/18 55.8 kg  09/22/18 56.2 kg    No intake or output data in the 24 hours ending 07/05/21 0916   Physical Exam  Awake Alert, No new F.N deficits, Normal affect Gary [name].AT,PERRAL Supple Neck, No JVD,   Symmetrical Chest wall movement, Good air movement bilaterally, CTAB RRR,No Gallops,Rubs or new Murmurs,  +ve B.Sounds, Abd Soft, No tenderness,   No Cyanosis, Clubbing or edema        Data Review:    CBC Recent Labs  Lab 07/04/21 1705 07/05/21 0413  WBC 6.6 4.7  HGB 15.1 13.2  HCT 43.5 38.9*  PLT 255 228  MCV 94.2 93.7  MCH 32.7 31.8  MCHC 34.7 33.9  RDW 12.3 12.5    Electrolytes Recent Labs  Lab 07/04/21 1705 07/05/21 0412 07/05/21 0413  NA 138  --  136  K 4.3  --  3.3*  CL 102  --  101  CO2 26  --  27  GLUCOSE 127*  --  119*  BUN 14  --  16  CREATININE 1.39*  --  1.27*  CALCIUM 9.5  --  9.0  AST 21  --   --   ALT 17  --   --   ALKPHOS 91  --   --   BILITOT 0.5  --   --   ALBUMIN 4.2  --   --   TSH  --  3.033  --     ------------------------------------------------------------------------------------------------------------------ No results for input(s): CHOL, HDL, LDLCALC, TRIG, CHOLHDL, LDLDIRECT in the last 72 hours.  Lab Results  Component Value Date   HGBA1C 5.4 09/26/2018    Recent Labs    07/05/21 0412  TSH 3.033     Radiology Reports DG Chest 1 View  Result Date: 07/04/2021 CLINICAL DATA:  Weakness.  Dizziness. EXAM: CHEST  1 VIEW COMPARISON:  04/04/2020 FINDINGS:  Heart size and mediastinal contours are unremarkable. No pleural effusion or edema identified. No airspace opacities. Visualized osseous structures are unremarkable. IMPRESSION: No acute cardiopulmonary abnormalities. Electronically Signed   By: Gary [name].   On: 07/04/2021 18:13   CT Head Wo Contrast  Result Date: 07/04/2021 CLINICAL DATA:  Neuro deficit, acute, stroke suspected. EXAM: CT HEAD WITHOUT CONTRAST TECHNIQUE: Contiguous axial images were obtained from the base of the skull through the vertex without intravenous contrast. RADIATION DOSE REDUCTION: This exam was performed according to the departmental dose-optimization program which includes automated exposure control, adjustment of the mA and/or kV according to patient size and/or use of iterative reconstruction technique. COMPARISON:  05/16/2017 FINDINGS: Brain: No abnormality seen affecting the brainstem or cerebellum. Cerebral hemispheres are normal except for focal low-density in the external capsule on the right consistent with small vessel infarction, age indeterminate. 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 No mass lesion, hemorrhage, hydrocephalus or extra-axial collection. Vascular: No abnormal vascular finding. Skull: Normal Sinuses/Orbits: Clear/normal Other: None IMPRESSION: No acute finding by CT. 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 Electronically Signed   By: Paulina Fusi [name].   On: 07/04/2021 18:26   MR BRAIN WO CONTRAST  Result Date: 07/05/2021 CLINICAL DATA:  Initial evaluation for neuro deficit, stroke suspected. EXAM: MRI HEAD WITHOUT CONTRAST TECHNIQUE: Multiplanar, multiecho pulse sequences of the brain and surrounding structures were obtained without intravenous contrast. COMPARISON:  Prior CT from 07/04/2021. FINDINGS: Brain: Cerebral volume within normal  limits. Few scattered patchy foci of subcentimeter T2/FLAIR hyperintensity seen involving the periventricular, deep, and subcortical white matter both cerebral hemispheres, nonspecific, but most commonly related to chronic microvascular ischemic disease. Overall, appearance is mild for age. No evidence for acute or subacute infarct. Gray-white matter differentiation maintained. No encephalomalacia to suggest chronic cortical infarction. No acute or chronic intracranial blood products. No mass lesion, midline shift or mass effect. No hydrocephalus or extra-axial fluid collection. Pituitary gland and suprasellar region within normal limits. Midline structures intact. Vascular: Major intracranial vascular flow voids are maintained. Skull and upper cervical spine: Craniocervical junction normal. Bone marrow signal intensity within normal limits. No scalp soft tissue abnormality. Sinuses/Orbits: Globes orbital soft tissues within normal limits. Paranasal sinuses are largely clear. No mastoid effusion. Other: None. IMPRESSION: 1. No acute intracranial abnormality. 2. Mild cerebral white  matter disease, nonspecific, but most commonly related to chronic microvascular ischemic changes. Electronically Signed   By: Rise Mu [name].   On: 07/05/2021 01:15   MR LUMBAR SPINE WO CONTRAST  Result Date: 07/05/2021 CLINICAL DATA:  Initial evaluation for acute ataxia. EXAM: MRI LUMBAR SPINE WITHOUT CONTRAST TECHNIQUE: Multiplanar, multisequence MR imaging of the lumbar spine was performed. No intravenous contrast was administered. COMPARISON:  None. FINDINGS: Segmentation: Standard. Lowest well-formed disc space labeled the L5-S1 level. Alignment: Mild dextroscoliosis. Alignment otherwise normal with preservation of the normal lumbar lordosis. No listhesis. Vertebrae: Vertebral body height maintained without acute or chronic fracture. Bone marrow signal intensity mildly heterogeneous but overall within normal limits. No  worrisome osseous lesions or abnormal marrow edema. Conus medullaris and cauda equina: Conus extends to the L1-2 level. Conus and cauda equina appear normal. Paraspinal and other soft tissues: Paraspinous soft tissues within normal limits. Left-sided IVC noted. Visualized visceral structures otherwise unremarkable. Disc levels: T11-12: Seen only on sagittal projection. Disc desiccation with mild disc bulge. No significant stenosis. T12-L1: Unremarkable. L1-2:  Unremarkable. L2-3:  Negative interspace.  Mild facet hypertrophy.  No stenosis. L3-4:  Negative interspace.  Mild facet hypertrophy.  No stenosis. L4-5: Negative interspace. Mild right greater than left facet hypertrophy. No stenosis. L5-S1: Negative interspace. Moderate right with mild left facet hypertrophy. No stenosis. IMPRESSION: 1. No acute abnormality within the lumbar spine. No significant disc pathology, stenosis, or evidence for neural impingement. 2. Mild-to-moderate multilevel facet hypertrophy, most notable at L5-S1 on the right. Finding could contribute to underlying back pain. Electronically Signed   By: Rise Mu [name].   On: 07/05/2021 01:21   DG Hip Unilat W or Wo Pelvis 2-3 Views Right  Result Date: 07/04/2021 CLINICAL DATA:  Right hip arthroplasty. Patient complains of pain in the right hip. EXAM: DG HIP (WITH OR WITHOUT PELVIS) 2-3V RIGHT COMPARISON:  09/16/2018. FINDINGS: Postsurgical changes from right total hip arthroplasty identified. No signs of periprosthetic fracture or dislocation. Heterotopic bone formation is identified lateral to the femoral neck. Left hip appears unremarkable. IMPRESSION: Status post right total hip arthroplasty. No fracture or dislocation identified. Electronically Signed   By: Gary [name].   On: 07/04/2021 18:19

## 2021-07-05 NOTE — Plan of Care (Signed)

## 2021-07-05 NOTE — Consult Note (Addendum)
NEURO HOSPITALIST CONSULT NOTE   Requestig physician: Dr. Rachael Darby  Reason for Consult: Progressive difficulty with ambulation  History obtained from:  Patient and Chart     HPI:                                                                                                                                          Gary [name] is an 56 y.o. male with a PMHx of bipolar 2 disorder, depression, anxiety, right hip surgeries approximately 3 years ago and hyperhidrosis who presents to the ED for accelerating progressive difficulty ambulating over the past several months on a background of 3 years' more gradual progressive gait impairment. He states that his symptoms started after right hip surgeries following a fall backwards resulting in right hip fracture. He is a poor historian and is unable to relate if his fall backwards 3 years ago was due to an already existing gait problem, or not. He does endorse that the gait problems have been progressing more rapidly over the last several months. He states that he started using a cane recently due to the worsening. He also endorses that after walking a few steps he has a tendency to fall forward. He states that when he stands, he tends to develop a prominent bilateral leg tremor. At times, the leg tremor occurs while at rest as well.   Hospitalist HPI has been reviewed: "Gary [name] is a 56 y.o. male with medical history significant of Bipolar disorder, anxiety who presents for evaluation of  inability to walk.  Ports that for the first 52 years of his life he lives with his mother for the last few years his lives independently in an apartment.  He lives alone.  He has been having to walk with a cane for the last few months due to soreness in his right hip and weakness of his legs.  Weakness is progressively gotten worse and today he was not able to stand up or ambulate at all.  He has had his right hip replaced.  He reports he had a medical  transport company coming to take him to [employer] to do his shopping but when they arrived he was not able to stand up or walk.  When he tried to walk his legs became very weak and was shaking.  He reports he has had some falls at home due to weakness in his legs last week but he has not hit his head or had loss of consciousness.  States he does get some "tingling" in his legs at times but is not persistent.  He denies any headache, neck injury or stiff neck.  He has not had any fever, chills, nausea vomiting diarrhea, chest pain or shortness of breath. Reports he has never had  problems like this in the past. No hx of DVT/PE. Denies injury to back or neck.  Reports he has been able to urinate and have normal bowel movements."  Home medications include olanzapine and benztropine.   Past Medical History:  Diagnosis Date   Anxiety    Bipolar 2 disorder (HCC)    Depression    GERD (gastroesophageal reflux disease)    Hyperhydrosis disorder     Past Surgical History:  Procedure Laterality Date   HERNIA REPAIR     HIP SURGERY Right    LEFT HEART CATH AND CORONARY ANGIOGRAPHY N/A 09/19/2018   Procedure: LEFT HEART CATH AND CORONARY ANGIOGRAPHY;  Surgeon: Corky CraftsVaranasi, Jayadeep S, MD;  [facility];  Service: Cardiovascular;  Laterality: N/A;    History reviewed. No pertinent family history.            Social History:  reports that he has never smoked. He has never used smokeless tobacco. He reports that he does not drink alcohol and does not use drugs.  Allergies  Allergen Reactions   Bee Venom Swelling    Swelling at site of sting   Erythromycin Diarrhea    HOME MEDICATIONS:                                                                                                                      No current facility-administered medications on file prior to encounter.   Current Outpatient Medications on File Prior to Encounter  Medication Sig Dispense Refill   OLANZapine (ZYPREXA) 5  MG tablet Take 1 tablet (5 mg total) by mouth at bedtime. For mood 30 tablet 0   benztropine (COGENTIN) 0.5 MG tablet Take 1 tablet (0.5 mg total) by mouth 2 (two) times daily. For EPS (Patient not taking: Reported on 07/04/2021) 60 tablet 0   escitalopram (LEXAPRO) 20 MG tablet Take 1 tablet (20 mg total) by mouth daily. For depression/anxiety (Patient not taking: Reported on 07/04/2021) 30 tablet 0   famotidine (PEPCID) 20 MG tablet Take 1 tablet (20 mg total) by mouth daily. For reflux (Patient not taking: Reported on 07/04/2021) 30 tablet 0   gabapentin (NEURONTIN) 400 MG capsule Take 1 capsule (400 mg total) by mouth 3 (three) times daily. (Patient not taking: Reported on 07/04/2021) 90 capsule 0     ROS:  As per HPI. Does not endorse any additional symptoms.   HEENT: There is brownish pigmentation versus discoloration around the margins of the irises bilaterally. Lungs: Respirations unlabored Ext: Chronic-appearing brown hyperpigmentation along anterior aspects of lower legs bilaterally.   Neurological Examination Mental Status: Awake and alert. Oriented x 5. Normal latencies of verbal and motor responses. Speech fluent with intact comprehension and naming. No dysarthria. Prominently noted is flattened affect, hypomimia and decreased blink rate, giving the impression of an nearly unblinking, staring quality by the patient while speaking. Also with flattening of vocal affect (decreased prosody).  Cranial Nerves: II: Temporal visual fields intact with no extinction to DSS. PERRL.   III,IV, VI: Saccades are normal. Visual pursuits are full. No nystagmus. No ptosis.  V: Temp sensation equal bilaterally VII: Smile symmetric but with an odd grimacing quality. Otherwise with hypomimia during the entire interview with no spontaneous smiling.  VIII: Hearing intact to  voice IX,X: No hypophonia or hoarseness XI: Symmetric shoulder shrug XII: Midline tongue extension, but with some fasciculations noted at the lateral aspects.  Motor: Right : Upper extremity   5/5    Left:     Upper extremity   5/5  Lower extremity   5/5     Lower extremity   5/5 Cogwheel rigidity of BUE > BLE is noted Sensory: Temp and light touch intact throughout, bilaterally. No extinction to DSS.  Deep Tendon Reflexes: 3-4+ brachioradialis, 3+ biceps bilaterally. 3+ bilateral patellae.  Negative Hoffman's bilaterally Cerebellar: No ataxia with FNF bilaterally.  Gait: Stands with own power. Slightly stooped posture. Requires support with a cane in right hand. Festinating and shuffling gait with small magnetic steps that culminates in patient falling forward requiring him to be steadied by staff.    Lab Results: Basic Metabolic Panel: Recent Labs  Lab 07/04/21 1705  NA 138  K 4.3  CL 102  CO2 26  GLUCOSE 127*  BUN 14  CREATININE 1.39*  CALCIUM 9.5    CBC: Recent Labs  Lab 07/04/21 1705  WBC 6.6  HGB 15.1  HCT 43.5  MCV 94.2  PLT 255    Cardiac Enzymes: No results for input(s): CKTOTAL, CKMB, CKMBINDEX, TROPONINI in the last 168 hours.  Lipid Panel: No results for input(s): CHOL, TRIG, HDL, CHOLHDL, VLDL, LDLCALC in the last 168 hours.  Imaging: DG Chest 1 View  Result Date: 07/04/2021 CLINICAL DATA:  Weakness.  Dizziness. EXAM: CHEST  1 VIEW COMPARISON:  04/04/2020 FINDINGS: Heart size and mediastinal contours are unremarkable. No pleural effusion or edema identified. No airspace opacities. Visualized osseous structures are unremarkable. IMPRESSION: No acute cardiopulmonary abnormalities. Electronically Signed   By: Signa Kell M.D.   On: 07/04/2021 18:13   CT Head Wo Contrast  Result Date: 07/04/2021 CLINICAL DATA:  Neuro deficit, acute, stroke suspected. EXAM: CT HEAD WITHOUT CONTRAST TECHNIQUE: Contiguous axial images were obtained from the base of the  skull through the vertex without intravenous contrast. RADIATION DOSE REDUCTION: This exam was performed according to the departmental dose-optimization program which includes automated exposure control, adjustment of the mA and/or kV according to patient size and/or use of iterative reconstruction technique. COMPARISON:  05/16/2017 FINDINGS: Brain: No abnormality seen affecting the brainstem or cerebellum. Cerebral hemispheres are normal except for focal low-density in the external capsule on the right consistent with small vessel infarction, age indeterminate. 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 No mass lesion, hemorrhage, hydrocephalus or extra-axial collection. Vascular: No abnormal vascular finding.  Skull: Normal Sinuses/Orbits: Clear/normal Other: None IMPRESSION: No acute finding by CT. 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 Electronically Signed   By: Paulina Fusi M.D.   On: 07/04/2021 18:26   DG Hip Unilat W or Wo Pelvis 2-3 Views Right  Result Date: 07/04/2021 CLINICAL DATA:  Right hip arthroplasty. Patient complains of pain in the right hip. EXAM: DG HIP (WITH OR WITHOUT PELVIS) 2-3V RIGHT COMPARISON:  09/16/2018. FINDINGS: Postsurgical changes from right total hip arthroplasty identified. No signs of periprosthetic fracture or dislocation. Heterotopic bone formation is identified lateral to the femoral neck. Left hip appears unremarkable. IMPRESSION: Status post right total hip arthroplasty. No fracture or dislocation identified. Electronically Signed   By: Signa Kell M.D.   On: 07/04/2021 18:19     Assessment: 56 year old male presenting with difficulty ambulating.  1. 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 Most prominent abnormalities consist of hypomimia,  cogwheel rigidity and festinating and shuffling gait with small magnetic steps that culminates in patient falling forward requiring him to be steadied by staff. Atypical findings of hyperreflexia and tongue fasciculations are also noted. Hyperpigmentation along the margins of the irises as well as the patient's shins is also noted, both which may be seen in Wilson's disease.  2. CT head: No acute finding by CT. 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  3. MRI brain preliminary review of images reveals no acute abnormality or other lesion to account for the patient's ambulatory symptoms. Official Radiology report is pending.  4. DDx for his atypical Parkinsonism includes an inherited Parkinsonian syndrome such as Wislon's disease (however, he denies any family history of such) and tardive dyskinesia from long-term antipsychotic use.    Recommendations: 1. PT/OT 2. MRI lumbar spine is pending 3. Will need outpatient follow up with the [facility] movement disorders clinic. At the discretion of the movement disorders specialist, the patient may benefit from PET scan to assess for possible basal ganglia hypometabolism.  4. May benefit from Psychiatry follow up for adjustment of his olanzapine and benztropine.  5. 24 hour urine copper excretion testing here at [facility] 6. Serum ceruloplasmin level, urine 24 hour copper, serum copper 7. Outpatient Ophthalmology consult to confirm or rule out possible Kayser-Fleischer rings and to assess for possible sunflower cataracts.     Electronically signed: Dr. Caryl Pina 07/05/2021, 1:14 AM

## 2021-07-05 NOTE — ED Notes (Signed)
Pt notified of need for urinal sample, pt given urinal at this time

## 2021-07-05 NOTE — ED Notes (Signed)
Dr. Lindzen at bedside. 

## 2021-07-05 NOTE — ED Notes (Signed)
This RN and Dr. Otelia Limes attempted to ambulate pt at bedside with cane - pt able to take a couple of shuffling steps before losing balance and almost falling - pt back in bed at this time

## 2021-07-06 DIAGNOSIS — Z96641 Presence of right artificial hip joint: Secondary | ICD-10-CM | POA: Diagnosis present

## 2021-07-06 DIAGNOSIS — E860 Dehydration: Secondary | ICD-10-CM | POA: Diagnosis present

## 2021-07-06 DIAGNOSIS — Z91030 Bee allergy status: Secondary | ICD-10-CM | POA: Diagnosis not present

## 2021-07-06 DIAGNOSIS — R42 Dizziness and giddiness: Secondary | ICD-10-CM | POA: Diagnosis not present

## 2021-07-06 DIAGNOSIS — Z881 Allergy status to other antibiotic agents status: Secondary | ICD-10-CM | POA: Diagnosis not present

## 2021-07-06 DIAGNOSIS — Z20822 Contact with and (suspected) exposure to covid-19: Secondary | ICD-10-CM | POA: Diagnosis present

## 2021-07-06 DIAGNOSIS — F3181 Bipolar II disorder: Secondary | ICD-10-CM | POA: Diagnosis present

## 2021-07-06 DIAGNOSIS — I10 Essential (primary) hypertension: Secondary | ICD-10-CM | POA: Diagnosis present

## 2021-07-06 DIAGNOSIS — F411 Generalized anxiety disorder: Secondary | ICD-10-CM | POA: Diagnosis present

## 2021-07-06 DIAGNOSIS — R29898 Other symptoms and signs involving the musculoskeletal system: Secondary | ICD-10-CM | POA: Diagnosis not present

## 2021-07-06 DIAGNOSIS — Z8673 Personal history of transient ischemic attack (TIA), and cerebral infarction without residual deficits: Secondary | ICD-10-CM | POA: Diagnosis not present

## 2021-07-06 DIAGNOSIS — K219 Gastro-esophageal reflux disease without esophagitis: Secondary | ICD-10-CM | POA: Diagnosis present

## 2021-07-06 DIAGNOSIS — R270 Ataxia, unspecified: Secondary | ICD-10-CM | POA: Diagnosis present

## 2021-07-06 DIAGNOSIS — Z79899 Other long term (current) drug therapy: Secondary | ICD-10-CM | POA: Diagnosis not present

## 2021-07-06 DIAGNOSIS — N179 Acute kidney failure, unspecified: Secondary | ICD-10-CM | POA: Diagnosis present

## 2021-07-06 DIAGNOSIS — W1830XA Fall on same level, unspecified, initial encounter: Secondary | ICD-10-CM | POA: Diagnosis present

## 2021-07-06 LAB — CBC WITH DIFFERENTIAL/PLATELET
Abs Immature Granulocytes: 0.01 10*3/uL (ref 0.00–0.07)
Basophils Absolute: 0 10*3/uL (ref 0.0–0.1)
Basophils Relative: 1 %
Eosinophils Absolute: 0.2 10*3/uL (ref 0.0–0.5)
Eosinophils Relative: 3 %
HCT: 39.8 % (ref 39.0–52.0)
Hemoglobin: 13.5 g/dL (ref 13.0–17.0)
Immature Granulocytes: 0 %
Lymphocytes Relative: 40 %
Lymphs Abs: 2 10*3/uL (ref 0.7–4.0)
MCH: 32.3 pg (ref 26.0–34.0)
MCHC: 33.9 g/dL (ref 30.0–36.0)
MCV: 95.2 fL (ref 80.0–100.0)
Monocytes Absolute: 0.4 10*3/uL (ref 0.1–1.0)
Monocytes Relative: 9 %
Neutro Abs: 2.4 10*3/uL (ref 1.7–7.7)
Neutrophils Relative %: 47 %
Platelets: 211 10*3/uL (ref 150–400)
RBC: 4.18 MIL/uL — ABNORMAL LOW (ref 4.22–5.81)
RDW: 12.6 % (ref 11.5–15.5)
WBC: 4.9 10*3/uL (ref 4.0–10.5)
nRBC: 0 % (ref 0.0–0.2)

## 2021-07-06 LAB — COMPREHENSIVE METABOLIC PANEL
ALT: 14 U/L (ref 0–44)
AST: 16 U/L (ref 15–41)
Albumin: 3.6 g/dL (ref 3.5–5.0)
Alkaline Phosphatase: 85 U/L (ref 38–126)
Anion gap: 7 (ref 5–15)
BUN: 18 mg/dL (ref 6–20)
CO2: 28 mmol/L (ref 22–32)
Calcium: 9.3 mg/dL (ref 8.9–10.3)
Chloride: 102 mmol/L (ref 98–111)
Creatinine, Ser: 1.67 mg/dL — ABNORMAL HIGH (ref 0.61–1.24)
GFR, Estimated: 47 mL/min — ABNORMAL LOW (ref >60)
Glucose, Bld: 100 mg/dL — ABNORMAL HIGH (ref 70–99)
Potassium: 4.4 mmol/L (ref 3.5–5.1)
Sodium: 137 mmol/L (ref 135–145)
Total Bilirubin: 0.5 mg/dL (ref 0.3–1.2)
Total Protein: 6 g/dL — ABNORMAL LOW (ref 6.5–8.1)

## 2021-07-06 LAB — URINALYSIS, ROUTINE W REFLEX MICROSCOPIC
Bilirubin Urine: NEGATIVE
Glucose, UA: NEGATIVE mg/dL
Hgb urine dipstick: NEGATIVE
Ketones, ur: NEGATIVE mg/dL
Leukocytes,Ua: NEGATIVE
Nitrite: NEGATIVE
Protein, ur: NEGATIVE mg/dL
Specific Gravity, Urine: 1.006 (ref 1.005–1.030)
pH: 7 (ref 5.0–8.0)

## 2021-07-06 LAB — URIC ACID: Uric Acid, Serum: 7.8 mg/dL (ref 3.7–8.6)

## 2021-07-06 LAB — OSMOLALITY, URINE: Osmolality, Ur: 296 mOsm/kg — ABNORMAL LOW (ref 300–900)

## 2021-07-06 LAB — MAGNESIUM: Magnesium: 1.8 mg/dL (ref 1.7–2.4)

## 2021-07-06 LAB — OSMOLALITY: Osmolality: 295 mOsm/kg (ref 275–295)

## 2021-07-06 LAB — CERULOPLASMIN: Ceruloplasmin: 17.7 mg/dL (ref 16.0–31.0)

## 2021-07-06 LAB — CREATININE, URINE, RANDOM: Creatinine, Urine: 34.68 mg/dL

## 2021-07-06 LAB — SODIUM, URINE, RANDOM: Sodium, Ur: 81 mmol/L

## 2021-07-06 LAB — BRAIN NATRIURETIC PEPTIDE: B Natriuretic Peptide: 15.8 pg/mL (ref 0.0–100.0)

## 2021-07-06 MED ORDER — AMLODIPINE BESYLATE 10 MG PO TABS
10.0000 mg | ORAL_TABLET | Freq: Every day | ORAL | Status: DC
  Administered 2021-07-06 – 2021-07-09 (×4): 10 mg via ORAL
  Filled 2021-07-06 (×4): qty 1

## 2021-07-06 MED ORDER — LACTATED RINGERS IV SOLN: INTRAVENOUS | Status: AC

## 2021-07-06 NOTE — Evaluation (Signed)
Physical Therapy Evaluation Patient Details Name: Gary [name] MRN: [id number] DOB: [birth year] Today's Date: 07/06/2021  History of Present Illness  Pt is a 56 y/o M presenting to ED on 2/11 c/o bilateral leg weakness and light headedness x3 months. PMH includes bipolar disorder, anxiety, depression, GERD and R THA.   Clinical Impression  Pt admitted with above diagnosis. Pt currently with functional limitations due to the deficits listed below (see PT Problem List). At the time of PT eval pt was able to perform transfers with supervision to modified independence, and ambulation with up to mod assist for balance support and to recover from LOB/prevent falls. Pt lives alone and reports no social support. He endorses several losses of balance at home where he has caught himself with his hands before he has fallen, however states he has been progressively weaker over the last 3 months. Pt is at an increased risk for falls and feel he could benefit from post-acute rehab to improve safety and functional independence prior to returning home alone. Pt reports he has been to SNF level rehab x2 in the past. Acutely, pt will benefit from skilled PT to increase their independence and safety with mobility to allow discharge to the venue listed below.          Recommendations for follow up therapy are one component of a multi-disciplinary discharge planning process, led by the attending physician.  Recommendations may be updated based on patient status, additional functional criteria and insurance authorization.  Follow Up Recommendations Skilled nursing-short term rehab (<3 hours/day)    Assistance Recommended at Discharge Intermittent Supervision/Assistance  Patient can return home with the following  A lot of help with walking and/or transfers;Assist for transportation    Equipment Recommendations None recommended by PT  Recommendations for Other Services       Functional Status Assessment Patient  has had a recent decline in their functional status and demonstrates the ability to make significant improvements in function in a reasonable and predictable amount of time.     Precautions / Restrictions Precautions Precautions: Fall Restrictions Weight Bearing Restrictions: No      Mobility  Bed Mobility Overal bed mobility: Modified Independent             General bed mobility comments: HOB minimally elevated and no use of rails. Pt was able to transition to EOB without difficulty.    Transfers Overall transfer level: Needs assistance Equipment used: None Transfers: Sit to/from Stand Sit to Stand: Supervision, Min assist           General transfer comment: Pt initiated stand before therapist prompted him to, and light supervision provided for safety. Upon return to room after ambulation pt required min assist for controlled lower to recliner due to fatigue and LE tremors.    Ambulation/Gait Ambulation/Gait assistance: Mod assist Gait Distance (Feet): 50 Feet Assistive device: Rolling walker (2 wheels), Straight cane Gait Pattern/deviations: Festinating, Shuffle, Trunk flexed, Narrow base of support Gait velocity: Decreased Gait velocity interpretation: <1.31 ft/sec, indicative of household ambulator   General Gait Details: Pt wanted to attempt without SPC, however therapist provided cane after observing festinating gait pattern. Progressed to RW due to x3 anterior losses of balance, requiring mod assist to recover. x1 additional anterior LOB with RW as well. BLE tremor noted, worse when "stuck" which is when pt would have loss of balance.  Stairs            Wheelchair Mobility    Modified Rankin (Stroke  Patients Only)       Balance Overall balance assessment: Needs assistance Sitting-balance support: Feet supported, No upper extremity supported Sitting balance-Leahy Scale: Fair     Standing balance support: Bilateral upper extremity supported,  During functional activity Standing balance-Leahy Scale: Poor Standing balance comment: Dynamically                             Pertinent Vitals/Pain Pain Assessment Pain Assessment: Faces Faces Pain Scale: Hurts a little bit Pain Location: R hip Pain Descriptors / Indicators: Discomfort Pain Intervention(s): Limited activity within patient's tolerance, Monitored during session, Repositioned    Home Living Family/patient expects to be discharged to:: Private residence Living Arrangements: Alone Available Help at Discharge: Other (Comment) (Pt reports he has no social support) Type of Home: Apartment Home Access: Elevator       Home Layout: One level Home Equipment: Cane - single Librarian, academic (2 wheels)      Prior Function Prior Level of Function : Independent/Modified Independent             Mobility Comments: Began using the cane ~1 week ago ADLs Comments: cleaning person comes 2x/month     Hand Dominance        Extremity/Trunk Assessment   Upper Extremity Assessment Upper Extremity Assessment: Defer to OT evaluation    Lower Extremity Assessment Lower Extremity Assessment: RLE deficits/detail;LLE deficits/detail RLE Deficits / Details: Bilateral LE tremor noted with activity. With MMT, strength 4+ to 5/5 in quads, hamstrings, hip flexors, and ankle DF.    Cervical / Trunk Assessment Cervical / Trunk Assessment: Normal  Communication   Communication: No difficulties  Cognition Arousal/Alertness: Awake/alert Behavior During Therapy: Flat affect Overall Cognitive Status: No family/caregiver present to determine baseline cognitive functioning                                 General Comments: Pt A&O x4, follows commands consistently        General Comments      Exercises     Assessment/Plan    PT Assessment Patient needs continued PT services  PT Problem List Decreased strength;Decreased activity  tolerance;Decreased balance;Decreased mobility;Decreased coordination;Decreased knowledge of use of DME;Decreased safety awareness;Decreased knowledge of precautions       PT Treatment Interventions DME instruction;Gait training;Functional mobility training;Therapeutic activities;Therapeutic exercise;Balance training;Neuromuscular re-education;Patient/family education    PT Goals (Current goals can be found in the Care Plan section)  Acute Rehab PT Goals Patient Stated Goal: Get better PT Goal Formulation: With patient Time For Goal Achievement: 07/20/21 Potential to Achieve Goals: Fair    Frequency Min 3X/week     Co-evaluation               AM-PAC PT "6 Clicks" Mobility  Outcome Measure Help needed turning from your back to your side while in a flat bed without using bedrails?: None Help needed moving from lying on your back to sitting on the side of a flat bed without using bedrails?: None Help needed moving to and from a bed to a chair (including a wheelchair)?: A Little Help needed standing up from a chair using your arms (e.g., wheelchair or bedside chair)?: A Little Help needed to walk in hospital room?: A Lot Help needed climbing 3-5 steps with a railing? : Total 6 Click Score: 17    End of Session Equipment Utilized During Treatment: Gait belt  Activity Tolerance: Other (comment) (Limited by LE tremors) Patient left: in chair;with call bell/phone within reach;with chair alarm set Nurse Communication: Mobility status PT Visit Diagnosis: Unsteadiness on feet (R26.81);Repeated falls (R29.6);Difficulty in walking, not elsewhere classified (R26.2)    Time: 0076-2263 PT Time Calculation (min) (ACUTE ONLY): 19 min   Charges:   PT Evaluation $PT Eval Moderate Complexity: 1 Mod          Conni Slipper, PT, DPT Acute Rehabilitation Services Pager: [phone] Office: [phone]   Marylynn Pearson 07/06/2021, 1:26 PM

## 2021-07-06 NOTE — Evaluation (Addendum)
Occupational Therapy Evaluation Patient Details Name: Gary [name] MRN: [id number] DOB: 1966/01/25 Today's Date: 07/06/2021   History of Present Illness Pt is a 56 y/o M presenting to ED on 2/11 c/o bilateral leg weakness and light headedness x3 months. PMH includes bipolar disorder, anxiety, depression, GERD and R THA.   Clinical Impression   Pt reports living alone in second floor apartment with elevator. Independent with ADLs/IADLs and mobility, reports using cane some as of recently, pt also has a cleaning person 2x a month. Pt currently at a supervision level for ADLs and transfers, mod I for bed mobility without AD. Pt with flat affect throughout session, however a & o x4 and follows commands consistently/responds to questions appropriately. Pt presenting with impairments listed below, will follow acutely. Recommend HHOT at d/c pending pt progress.     Recommendations for follow up therapy are one component of a multi-disciplinary discharge planning process, led by the attending physician.  Recommendations may be updated based on patient status, additional functional criteria and insurance authorization.   Follow Up Recommendations  Home health OT    Assistance Recommended at Discharge Intermittent Supervision/Assistance  Patient can return home with the following A little help with walking and/or transfers;A little help with bathing/dressing/bathroom;Assistance with cooking/housework    Functional Status Assessment  Patient has had a recent decline in their functional status and demonstrates the ability to make significant improvements in function in a reasonable and predictable amount of time.  Equipment Recommendations  Tub/shower seat    Recommendations for Other Services PT consult     Precautions / Restrictions Precautions Precautions: Fall Restrictions Weight Bearing Restrictions: No      Mobility Bed Mobility Overal bed mobility: Modified Independent                   Transfers Overall transfer level: Needs assistance Equipment used: None Transfers: Sit to/from Stand Sit to Stand: Supervision                  Balance Overall balance assessment: Mild deficits observed, not formally tested                                         ADL either performed or assessed with clinical judgement   ADL Overall ADL's : Needs assistance/impaired Eating/Feeding: Set up;Sitting   Grooming: Set up;Sitting   Upper Body Bathing: Supervision/ safety;Sitting   Lower Body Bathing: Supervison/ safety;Sitting/lateral leans   Upper Body Dressing : Supervision/safety;Sitting   Lower Body Dressing: Supervision/safety;Sit to/from stand Lower Body Dressing Details (indicate cue type and reason): able to pull up socks using figure 4 sitting EOB Toilet Transfer: Nature conservation officer;Ambulation   Toileting- Clothing Manipulation and Hygiene: Supervision/safety;Sit to/from stand Toileting - Clothing Manipulation Details (indicate cue type and reason): manages clothing in standing prior to sitting on commode Tub/ Shower Transfer: Nurse, learning disability Details (indicate cue type and reason): simulated stepping over tub wall, pt uses wall support, no LOB Functional mobility during ADLs: Min guard       Vision   Vision Assessment?: No apparent visual deficits     Perception     Praxis      Pertinent Vitals/Pain Pain Assessment Pain Assessment: No/denies pain     Hand Dominance     Extremity/Trunk Assessment Upper Extremity Assessment Upper Extremity Assessment: Overall WFL for tasks assessed (mild ataxia noted with overhead  reaching)   Lower Extremity Assessment Lower Extremity Assessment: Defer to PT evaluation   Cervical / Trunk Assessment Cervical / Trunk Assessment: Normal   Communication Communication Communication: No difficulties   Cognition Arousal/Alertness: Awake/alert Behavior During Therapy:  Flat affect Overall Cognitive Status: No family/caregiver present to determine baseline cognitive functioning                                 General Comments: pt A & O x4, follows commands consistently     General Comments  denies dizziness/lightheadedness during session    Exercises     Shoulder Instructions      Home Living Family/patient expects to be discharged to:: Private residence Living Arrangements: Alone   Type of Home: Apartment Home Access: Stairs to enter;Elevator (lives on second floor of apt complex)     Home Layout: One level     Bathroom Shower/Tub: Tub/shower unit;Curtain   Biochemist, clinical: Standard     Home Equipment: Cane - single Barista (2 wheels)   Additional Comments: has not been using cane      Prior Functioning/Environment Prior Level of Function : Independent/Modified Independent             Mobility Comments: does not use AD, cane as of recently ADLs Comments: cleaning person comes 2x/month        OT Problem List: Decreased strength;Decreased range of motion;Decreased activity tolerance;Impaired balance (sitting and/or standing)      OT Treatment/Interventions: Self-care/ADL training;Neuromuscular education;DME and/or AE instruction;Therapeutic activities;Patient/family education;Balance training    OT Goals(Current goals can be found in the care plan section) Acute Rehab OT Goals Patient Stated Goal: none stated OT Goal Formulation: With patient Time For Goal Achievement: 07/20/21 Potential to Achieve Goals: Good ADL Goals Pt Will Perform Upper Body Dressing: Independently;sitting Pt Will Perform Lower Body Dressing: Independently;sit to/from stand Pt Will Transfer to Toilet: Independently;regular height toilet;ambulating Pt Will Perform Tub/Shower Transfer: Tub transfer;rolling walker;ambulating;Independently;shower seat  OT Frequency: Min 2X/week    Co-evaluation              AM-PAC  OT "6 Clicks" Daily Activity     Outcome Measure Help from another person eating meals?: None Help from another person taking care of personal grooming?: None Help from another person toileting, which includes using toliet, bedpan, or urinal?: None Help from another person bathing (including washing, rinsing, drying)?: A Little Help from another person to put on and taking off regular upper body clothing?: A Little Help from another person to put on and taking off regular lower body clothing?: A Little 6 Click Score: 21   End of Session Nurse Communication: Mobility status  Activity Tolerance: Patient tolerated treatment well Patient left: in bed;with call bell/phone within reach;with bed alarm set;with nursing/sitter in room  OT Visit Diagnosis: Unsteadiness on feet (R26.81);Other abnormalities of gait and mobility (R26.89);Muscle weakness (generalized) (M62.81)                Time: [id number] OT Time Calculation (min): 16 min Charges:  OT General Charges $OT Visit: 1 Visit OT Evaluation $OT Eval Low Complexity: 1 Low  Lynnda Child, OTD, OTR/L Acute Rehab [phone] - [place] 07/06/2021, 8:23 AM

## 2021-07-06 NOTE — Progress Notes (Addendum)
Neurology Progress Note  Brief HPI: Gary [name] is a 56 yo male w/ hx of Type 2 Bipolar Disorder, Depression, Anxiety, R hip surgeries 3 years ago and hyperhidrosis presenting to ED for progressive difficulty ambulating over past several months. Patient reports he has had some LE weakness since his 3 years ago but this has progressed to the point he is unable to leave his bed which prompted him to come to the hospital.   Subjective: No family at bedside. Patient resting comfortably. Patient reports his lower extremity weakness is largely unchanged. Patient also reports some farsighted vision problems for past several months. Patient denies any sensory deficits. Patient reports his flat affect has persisted for several years now. Patient was able to smile spontaneously during assessment.  Patient reports he is regularly compliant with medications.  Exam: Vitals:   07/06/21 0518 07/06/21 0810  BP: (!) 154/85 (!) 150/90  Pulse: 65 75  Resp: 13 14  Temp: 97.6 F (36.4 C) 97.7 F (36.5 C)  SpO2: 98% 100%   Gen: In bed, NAD Resp: non-labored breathing, no acute distress Abd: soft, nt Skin: chronic appearing brown hyperpigmentation along anterior aspects of lower legs  Neuro: Mental Status: Awake and alert. Oriented x 5. Normal latency of verbal and motor responses. Speech fluent with intact comprehension and naming. No dysarthria. Flat affect but was able to smile twice spontaneously. Does have decreased prosody.  Cranial Nerves: II: Temporal visual fields intact with no extinction to DSS. PERRL.   III,IV, VI: Saccades are normal. Visual pursuits are full. No nystagmus. No ptosis.  V: Temp sensation equal bilaterally VII: Smile symmetric but with an odd grimacing quality. Otherwise with hypomimia during the entire interview with no spontaneous smiling.  VIII: Hearing intact to voice IX,X: No hypophonia or hoarseness XI: Symmetric shoulder shrug XII: Midline tongue extension Motor: LLE  4/5, RLE/RUE/LUE 5/5. Cogwheel rigidity noted in BUE.  Sensory:Temp and light touch intact.  DTR: 3+ bilateral patellae Gait: Deferred  Pertinent Labs: CBC    Component Value Date/Time   WBC 4.9 07/06/2021 0157   RBC 4.18 (L) 07/06/2021 0157   HGB 13.5 07/06/2021 0157   HCT 39.8 07/06/2021 0157   PLT 211 07/06/2021 0157   MCV 95.2 07/06/2021 0157   MCH 32.3 07/06/2021 0157   MCHC 33.9 07/06/2021 0157   RDW 12.6 07/06/2021 0157   LYMPHSABS 2.0 07/06/2021 0157   MONOABS 0.4 07/06/2021 0157   EOSABS 0.2 07/06/2021 0157   BASOSABS 0.0 07/06/2021 0157   CMP Latest Ref Rng & Units 07/06/2021 07/05/2021 07/04/2021  Glucose 70 - 99 mg/dL 338(S) 505(L) 976(B)  BUN 6 - 20 mg/dL 18 16 14   Creatinine 0.61 - 1.24 mg/dL ) 3.41(P) 3.79(K)  Sodium 135 - 145 mmol/L 137 136 138  Potassium 3.5 - 5.1 mmol/L 4.4 3.3(L) 4.3  Chloride 98 - 111 mmol/L 102 101 102  CO2 22 - 32 mmol/L 28 27 26   Calcium 8.9 - 10.3 mg/dL 9.3 9.0 9.5  Total Protein 6.5 - 8.1 g/dL 6.0(L) - 7.0  Total Bilirubin 0.3 - 1.2 mg/dL 0.5 - 0.5  Alkaline Phos 38 - 126 U/L 85 - 91  AST 15 - 41 U/L 16 - 21  ALT 0 - 44 U/L 14 - 17    Imaging Reviewed: CT head: no acute findings, 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 Mild-moderate multilevel facet hypertrophy  Assessment: Gary [name] is a 56 yo male w/ hx of Type 2 Bipolar Disorder, Depression, Anxiety, R hip surgeries 3 years ago and hyperhidrosis presenting to ED for progressive difficulty ambulating over past several months.   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 Of note, patient is not on home medication benztropine which may be contributing to some of patient's symptoms. Unclear if patient has been compliant with his  cogentin.   Recommendations: 1) Recommend resuming cogentin 2) May benefit from psychiatry follow up for medication management of psychiatric medications.  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 He is oriented. No h/o liver abd pain. No hx parkinsons/psych in his family. He otherwise feels fine. Doubt this is Wilson's but will wait on labs. Resume cogentin.  Dr. Viviann Spare evaluated pt independently, reviewed imaging, chart, labs. Discussed and formulated plan with the APP/resident. Please see APP/resident note above for details.   Total 36 minutes spent on counseling patient and coordinating care, writing notes and reviewing chart.  Gary Dowty,MD

## 2021-07-06 NOTE — Progress Notes (Signed)
PROGRESS NOTE                                                                                                                                                                                                             Patient Demographics:    Gary [name], is a 56 y.o. male, DOB - Jul 02, 1965, [id number]  Outpatient Primary MD for the patient is Osei-Bonsu, Greggory Stallion, MD    LOS - 0  Admit date - 07/04/2021    Chief Complaint  Patient presents with   Extremity Weakness   Dizziness       Brief Narrative (HPI from H&P)  56 y.o. male with medical history significant of Bipolar disorder, anxiety who presents for evaluation of  inability to walk.  He for the last few years his lives independently in an apartment.  He lives alone.  He has been having to walk with a cane for the last few months due to soreness in his right hip and weakness of his legs.  Weakness is progressively gotten worse and today he was not able to stand up or ambulate at all.  He has had his right hip replaced.  Upon further interview he informs [place] that he has been having gradually progressive lower extremity weakness affecting both legs for the last several months to the point that now he is unable to stand up.  In the ER he was seen by neurology for progressive lower extremity weakness and admitted to the hospital for further care   Subjective:   Patient in bed, appears comfortable, denies any headache, no fever, no chest pain or pressure, no shortness of breath , no abdominal pain. No new focal weakness.   Assessment  & Plan :     Bilateral lower extremity weakness with ataxia which is gradually progressive over several months in a patient with history of right hip replacement in the past - his symptoms are gradually progressive and do not sound acute however he lives alone and currently unable to stand or ambulate by himself without assistance.  Unsafe  discharge.  MRI brain and L-spine are nonacute, x-ray of the right hip nonacute.  His B12 level is borderline low and will be initiated on replacement for the same, neurology has seen the patient and his neuropathy work-up is underway.  Will advance activity-seen by PT OT.  2.  Borderline low B12 levels.  Replace check methylmalonic acid levels.  3.  Generalized anxiety disorder and bipolar disorder.  Continue combination of Zyprexa and his other home medications.  No acute issue.  4. HTN  - placed on Norvasc  5. AKI - check Ur lytes, Bladder scan, IVF, monitor.   Encouraged the patient to sit up in chair in the daytime use I-S and flutter valve for pulmonary toiletry.        Condition - Fair  Family Communication  :  None present  Code Status :  Full  Consults  :  Neuro  PUD Prophylaxis :    Procedures  :     MRI Brain - Non Acute  MRI L spine -  1. No acute abnormality within the lumbar spine. No significant disc pathology, stenosis, or evidence for neural impingement. 2. Mild-to-moderate multilevel facet hypertrophy, most notable at L5-S1 on the right. Finding could contribute to underlying back pain.       Disposition Plan  :    Status is: Observation   DVT Prophylaxis  :    enoxaparin (LOVENOX) injection 40 mg Start: 07/05/21 1000    Lab Results  Component Value Date   PLT 211 07/06/2021    Diet :  Diet Order             Diet regular Room service appropriate? Yes; Fluid consistency: Thin  Diet effective now                    Inpatient Medications  Scheduled Meds:  amLODipine  10 mg Oral Daily   cyanocobalamin  1,000 mcg Intramuscular Daily   enoxaparin (LOVENOX) injection  40 mg Subcutaneous Daily   escitalopram  20 mg Oral Daily   OLANZapine  5 mg Oral QHS   sodium chloride flush  3 mL Intravenous Q12H   [START ON 07/08/2021] vitamin B-12  1,000 mcg Oral Daily   Continuous Infusions:  sodium chloride     lactated ringers 125 mL/hr at  07/06/21 0618   PRN Meds:.sodium chloride, acetaminophen **OR** acetaminophen, LORazepam, sodium chloride flush  Antibiotics  :    Anti-infectives (From admission, onward)    None        Time Spent in minutes  30   Susa Raring M.D on 07/06/2021 at 10:04 AM  To page go to [email or web address]   [facility] -  Office  [phone]  See all Orders from today for further details    Objective:   Vitals:   07/05/21 1900 07/05/21 2121 07/06/21 0518 07/06/21 0810  BP: 130/89 130/89 (!) 154/85 (!) 150/90  Pulse: 73 73 65 75  Resp: 14 14 13 14   Temp: 98.1 F (36.7 C) 98.1 F (36.7 C) 97.6 F (36.4 C) 97.7 F (36.5 C)  TempSrc: Oral Oral Oral Oral  SpO2: 95%  98% (!) 1%  Weight:  62.6 kg    Height:  5\' 3"  (1.6 m)      Wt Readings from Last 3 Encounters:  07/05/21 62.6 kg  04/04/20 56.2 kg  09/25/18 55.8 kg     Intake/Output Summary (Last 24 hours) at 07/06/2021 1004 Last data filed at 07/06/2021 0618 Gross per 24 hour  Intake 0 ml  Output --  Net 0 ml     Physical Exam  Awake Alert, No new F.N deficits, Normal affect [place].AT,PERRAL Supple Neck, No JVD,   Symmetrical Chest wall movement, Good air movement bilaterally, CTAB RRR,No Gallops, Rubs or  new Murmurs,  +ve B.Sounds, Abd Soft, No tenderness,   No Cyanosis, Clubbing or edema     Data Review:    CBC Recent Labs  Lab 07/04/21 1705 07/05/21 0413 07/06/21 0157  WBC 6.6 4.7 4.9  HGB 15.1 13.2 13.5  HCT 43.5 38.9* 39.8  PLT 255 228 211  MCV 94.2 93.7 95.2  MCH 32.7 31.8 32.3  MCHC 34.7 33.9 33.9  RDW 12.3 12.5 12.6  LYMPHSABS  --   --  2.0  MONOABS  --   --  0.4  EOSABS  --   --  0.2  BASOSABS  --   --  0.0    Electrolytes Recent Labs  Lab 07/04/21 1705 07/05/21 0412 07/05/21 0413 07/06/21 0157  NA 138  --  136 137  K 4.3  --  3.3* 4.4  CL 102  --  101 102  CO2 26  --  27 28  GLUCOSE 127*  --  119* 100*  BUN 14  --  16 18  CREATININE 1.39*  --  1.27* 1.67*  CALCIUM 9.5  --   9.0 9.3  AST 21  --   --  16  ALT 17  --   --  14  ALKPHOS 91  --   --  85  BILITOT 0.5  --   --  0.5  ALBUMIN 4.2  --   --  3.6  MG  --   --   --  1.8  TSH  --  3.033  --   --   BNP  --   --   --  15.8    ------------------------------------------------------------------------------------------------------------------ No results for input(s): CHOL, HDL, LDLCALC, TRIG, CHOLHDL, LDLDIRECT in the last 72 hours.  Lab Results  Component Value Date   HGBA1C 5.4 09/26/2018    Recent Labs    07/05/21 0412  TSH 3.033     Radiology Reports DG Chest 1 View  Result Date: 07/04/2021 CLINICAL DATA:  Weakness.  Dizziness. EXAM: CHEST  1 VIEW COMPARISON:  04/04/2020 FINDINGS: Heart size and mediastinal contours are unremarkable. No pleural effusion or edema identified. No airspace opacities. Visualized osseous structures are unremarkable. IMPRESSION: No acute cardiopulmonary abnormalities. Electronically Signed   By: Signa Kell M.D.   On: 07/04/2021 18:13   CT Head Wo Contrast  Result Date: 07/04/2021 CLINICAL DATA:  Neuro deficit, acute, stroke suspected. EXAM: CT HEAD WITHOUT CONTRAST TECHNIQUE: Contiguous axial images were obtained from the base of the skull through the vertex without intravenous contrast. RADIATION DOSE REDUCTION: This exam was performed according to the departmental dose-optimization program which includes automated exposure control, adjustment of the mA and/or kV according to patient size and/or use of iterative reconstruction technique. COMPARISON:  05/16/2017 FINDINGS: Brain: No abnormality seen affecting the brainstem or cerebellum. Cerebral hemispheres are normal except for focal low-density in the external capsule on the right consistent with small vessel infarction, age indeterminate. 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 No mass lesion, hemorrhage, hydrocephalus or extra-axial collection. Vascular: No abnormal vascular  finding. Skull: Normal Sinuses/Orbits: Clear/normal Other: None IMPRESSION: No acute finding by CT. 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 Electronically Signed   By: Paulina Fusi M.D.   On: 07/04/2021 18:26   MR BRAIN WO CONTRAST  Result Date: 07/05/2021 CLINICAL DATA:  Initial evaluation for neuro deficit, stroke suspected. EXAM: MRI  HEAD WITHOUT CONTRAST TECHNIQUE: Multiplanar, multiecho pulse sequences of the brain and surrounding structures were obtained without intravenous contrast. COMPARISON:  Prior CT from 07/04/2021. FINDINGS: Brain: Cerebral volume within normal limits. Few scattered patchy foci of subcentimeter T2/FLAIR hyperintensity seen involving the periventricular, deep, and subcortical white matter both cerebral hemispheres, nonspecific, but most commonly related to chronic microvascular ischemic disease. Overall, appearance is mild for age. No evidence for acute or subacute infarct. Gray-white matter differentiation maintained. No encephalomalacia to suggest chronic cortical infarction. No acute or chronic intracranial blood products. No mass lesion, midline shift or mass effect. No hydrocephalus or extra-axial fluid collection. Pituitary gland and suprasellar region within normal limits. Midline structures intact. Vascular: Major intracranial vascular flow voids are maintained. Skull and upper cervical spine: Craniocervical junction normal. Bone marrow signal intensity within normal limits. No scalp soft tissue abnormality. Sinuses/Orbits: Globes orbital soft tissues within normal limits. Paranasal sinuses are largely clear. No mastoid effusion. Other: None. IMPRESSION: 1. No acute intracranial abnormality. 2. Mild cerebral white matter disease, nonspecific, but most commonly related to chronic microvascular ischemic changes. Electronically Signed   By: Rise MuBenjamin  McClintock  M.D.   On: 07/05/2021 01:15   MR LUMBAR SPINE WO CONTRAST  Result Date: 07/05/2021 CLINICAL DATA:  Initial evaluation for acute ataxia. EXAM: MRI LUMBAR SPINE WITHOUT CONTRAST TECHNIQUE: Multiplanar, multisequence MR imaging of the lumbar spine was performed. No intravenous contrast was administered. COMPARISON:  None. FINDINGS: Segmentation: Standard. Lowest well-formed disc space labeled the L5-S1 level. Alignment: Mild dextroscoliosis. Alignment otherwise normal with preservation of the normal lumbar lordosis. No listhesis. Vertebrae: Vertebral body height maintained without acute or chronic fracture. Bone marrow signal intensity mildly heterogeneous but overall within normal limits. No worrisome osseous lesions or abnormal marrow edema. Conus medullaris and cauda equina: Conus extends to the L1-2 level. Conus and cauda equina appear normal. Paraspinal and other soft tissues: Paraspinous soft tissues within normal limits. Left-sided IVC noted. Visualized visceral structures otherwise unremarkable. Disc levels: T11-12: Seen only on sagittal projection. Disc desiccation with mild disc bulge. No significant stenosis. T12-L1: Unremarkable. L1-2:  Unremarkable. L2-3:  Negative interspace.  Mild facet hypertrophy.  No stenosis. L3-4:  Negative interspace.  Mild facet hypertrophy.  No stenosis. L4-5: Negative interspace. Mild right greater than left facet hypertrophy. No stenosis. L5-S1: Negative interspace. Moderate right with mild left facet hypertrophy. No stenosis. IMPRESSION: 1. No acute abnormality within the lumbar spine. No significant disc pathology, stenosis, or evidence for neural impingement. 2. Mild-to-moderate multilevel facet hypertrophy, most notable at L5-S1 on the right. Finding could contribute to underlying back pain. Electronically Signed   By: Rise MuBenjamin  McClintock M.D.   On: 07/05/2021 01:21   DG Hip Unilat W or Wo Pelvis 2-3 Views Right  Result Date: 07/04/2021 CLINICAL DATA:  Right hip  arthroplasty. Patient complains of pain in the right hip. EXAM: DG HIP (WITH OR WITHOUT PELVIS) 2-3V RIGHT COMPARISON:  09/16/2018. FINDINGS: Postsurgical changes from right total hip arthroplasty identified. No signs of periprosthetic fracture or dislocation. Heterotopic bone formation is identified lateral to the femoral neck. Left hip appears unremarkable. IMPRESSION: Status post right total hip arthroplasty. No fracture or dislocation identified. Electronically Signed   By: Signa Kellaylor  Stroud M.D.   On: 07/04/2021 18:19

## 2021-07-07 LAB — MAGNESIUM: Magnesium: 1.8 mg/dL (ref 1.7–2.4)

## 2021-07-07 LAB — CBC WITH DIFFERENTIAL/PLATELET
Abs Immature Granulocytes: 0.01 10*3/uL (ref 0.00–0.07)
Basophils Absolute: 0 10*3/uL (ref 0.0–0.1)
Basophils Relative: 1 %
Eosinophils Absolute: 0.1 10*3/uL (ref 0.0–0.5)
Eosinophils Relative: 3 %
HCT: 39.9 % (ref 39.0–52.0)
Hemoglobin: 13.9 g/dL (ref 13.0–17.0)
Immature Granulocytes: 0 %
Lymphocytes Relative: 40 %
Lymphs Abs: 1.9 10*3/uL (ref 0.7–4.0)
MCH: 32.5 pg (ref 26.0–34.0)
MCHC: 34.8 g/dL (ref 30.0–36.0)
MCV: 93.2 fL (ref 80.0–100.0)
Monocytes Absolute: 0.4 10*3/uL (ref 0.1–1.0)
Monocytes Relative: 9 %
Neutro Abs: 2.3 10*3/uL (ref 1.7–7.7)
Neutrophils Relative %: 47 %
Platelets: 218 10*3/uL (ref 150–400)
RBC: 4.28 MIL/uL (ref 4.22–5.81)
RDW: 12.2 % (ref 11.5–15.5)
WBC: 4.7 10*3/uL (ref 4.0–10.5)
nRBC: 0 % (ref 0.0–0.2)

## 2021-07-07 LAB — COMPREHENSIVE METABOLIC PANEL
ALT: 14 U/L (ref 0–44)
AST: 18 U/L (ref 15–41)
Albumin: 3.6 g/dL (ref 3.5–5.0)
Alkaline Phosphatase: 84 U/L (ref 38–126)
Anion gap: 8 (ref 5–15)
BUN: 15 mg/dL (ref 6–20)
CO2: 27 mmol/L (ref 22–32)
Calcium: 9.4 mg/dL (ref 8.9–10.3)
Chloride: 103 mmol/L (ref 98–111)
Creatinine, Ser: 1.17 mg/dL (ref 0.61–1.24)
GFR, Estimated: >60 mL/min (ref >60)
Glucose, Bld: 109 mg/dL — ABNORMAL HIGH (ref 70–99)
Potassium: 4.1 mmol/L (ref 3.5–5.1)
Sodium: 138 mmol/L (ref 135–145)
Total Bilirubin: 0.4 mg/dL (ref 0.3–1.2)
Total Protein: 5.9 g/dL — ABNORMAL LOW (ref 6.5–8.1)

## 2021-07-07 LAB — BRAIN NATRIURETIC PEPTIDE: B Natriuretic Peptide: 25.7 pg/mL (ref 0.0–100.0)

## 2021-07-07 LAB — HIV ANTIBODY (ROUTINE TESTING W REFLEX): HIV Screen 4th Generation wRfx: NONREACTIVE

## 2021-07-07 NOTE — NC FL2 (Signed)
[place] LEVEL OF CARE SCREENING TOOL     IDENTIFICATION  Patient Name: Gary [name] Birthdate: [birth year] Sex: male Admission Date (Current Location): 07/04/2021  [facility] and [place] Number:  Herbalist and Address:  [facility], [place] [address] [place], [place] [address]      Provider Number: [id number]  Attending Physician Name and Address:  Thurnell Lose, MD  Relative Name and Phone Number:       Current Level of Care: Hospital Recommended Level of Care: [place] Prior Approval Number:    Date Approved/Denied:   PASRR Number: pending  Discharge Plan: SNF    Current Diagnoses: Patient Active Problem List   Diagnosis Date Noted   Ataxia 07/04/2021   Weakness of both legs 07/04/2021   MDD (major depressive disorder) 09/25/2018   GERD (gastroesophageal reflux disease)    Bipolar affective disorder, currently active ([place]) 05/16/2017   Severe recurrent major depression without psychotic features ([place]) 11/06/2015   GAD (generalized anxiety disorder) 11/06/2015    Orientation RESPIRATION BLADDER Height & Weight     Self, Time, Situation, Place  Normal Continent Weight: 138 lb 0.1 oz (62.6 kg) Height:  5\' 3"  (160 cm)  BEHAVIORAL SYMPTOMS/MOOD NEUROLOGICAL BOWEL NUTRITION STATUS      Continent Diet (Refer to d/c summary)  AMBULATORY STATUS COMMUNICATION OF NEEDS Skin   Extensive Assist Verbally Normal                       Personal Care Assistance Level of Assistance  Bathing, Feeding, Dressing Bathing Assistance: Maximum assistance Feeding assistance: Independent Dressing Assistance: Maximum assistance     Functional Limitations Info  Sight, Hearing, Speech Sight Info: Adequate Hearing Info: Adequate Speech Info: Adequate    SPECIAL CARE FACTORS FREQUENCY  PT (By licensed PT), OT (By licensed OT)     PT Frequency: 5x/week, evaluate and treat OT Frequency: 5x/week, evaluate and  treat            Contractures Contractures Info: Not present    Additional Factors Info  Code Status, Allergies Code Status Info: full code Allergies Info: Bee Venom, Erythromycin           Current Medications (07/07/2021):  This is the current hospital active medication list Current Facility-Administered Medications  Medication Dose Route Frequency Provider Last Rate Last Admin   0.9 %  sodium chloride infusion  250 mL Intravenous PRN Chotiner, Yevonne Aline, MD       acetaminophen (TYLENOL) tablet 650 mg  650 mg Oral Q6H PRN Chotiner, Yevonne Aline, MD       Or   acetaminophen (TYLENOL) suppository 650 mg  650 mg Rectal Q6H PRN Chotiner, Yevonne Aline, MD       amLODipine (NORVASC) tablet 10 mg  10 mg Oral Daily Thurnell Lose, MD   10 mg at 07/07/21 0936   enoxaparin (LOVENOX) injection 40 mg  40 mg Subcutaneous Daily Chotiner, Yevonne Aline, MD   40 mg at 07/07/21 0937   escitalopram (LEXAPRO) tablet 20 mg  20 mg Oral Daily Chotiner, Yevonne Aline, MD   20 mg at 07/07/21 0936   LORazepam (ATIVAN) tablet 1 mg  1 mg Oral Q8H PRN Chotiner, Yevonne Aline, MD       OLANZapine (ZYPREXA) tablet 5 mg  5 mg Oral QHS Chotiner, Yevonne Aline, MD   5 mg at 07/06/21 2114   sodium chloride flush (NS) 0.9 % injection  3 mL  3 mL Intravenous Q12H Chotiner, Yevonne Aline, MD   3 mL at 07/07/21 0937   sodium chloride flush (NS) 0.9 % injection 3 mL  3 mL Intravenous PRN Chotiner, Yevonne Aline, MD       Derrill Memo ON 07/08/2021] vitamin B-12 (CYANOCOBALAMIN) tablet 1,000 mcg  1,000 mcg Oral Daily Thurnell Lose, MD         Discharge Medications: Please see discharge summary for a list of discharge medications.  Relevant Imaging Results:  Relevant Lab Results:   Additional Information SS# [id number]. Pt is vaccinated for covid but not boosted.  Joanne Chars, LCSW

## 2021-07-07 NOTE — Progress Notes (Signed)
Mobility Specialist Progress Note    07/07/21 0913  Mobility  Bed Position Chair  Activity Ambulated with assistance in hallway  Level of Assistance Contact guard assist, steadying assist  Assistive Device Front wheel walker  Distance Ambulated (ft) 320 ft  Activity Response Tolerated fair  $Mobility charge 1 Mobility   Pt received in bed and agreeable. C/o some leg weakness. Took x1 seated rest break halfway through. Returned to chair with call bell in reach.   [facility] Mobility Specialist  M.S. 5N: [phone]

## 2021-07-07 NOTE — Progress Notes (Signed)
° ° °  RE:  Gary [name]       Date of Birth:  [birth year]     Date:   07/07/21       To Whom It May Concern:  Please be advised that the above-named patient will require a short-term nursing home stay - anticipated 30 days or less for rehabilitation and strengthening.  The plan is for return home.                 MD signature                Date

## 2021-07-07 NOTE — Plan of Care (Signed)
  Problem: Activity: Goal: Risk for activity intolerance will decrease Outcome: Progressing   Problem: Nutrition: Goal: Adequate nutrition will be maintained Outcome: Progressing   Problem: Elimination: Goal: Will not experience complications related to bowel motility Outcome: Progressing   

## 2021-07-07 NOTE — TOC Initial Note (Signed)
Transition of Care [facility]) - Initial/Assessment Note    Patient Details  Name: Gary [name] MRN: [id number] Date of Birth: January 12, 1966  Transition of Care [facility]) CM/SW Contact:    Joanne Chars, LCSW Phone Number: 07/07/2021, 3:57 PM  Clinical Narrative:     CSW met with pt regarding DC recommendation for SNF.  Pt agreeable to this, choice document provided, permission given to send out referral in hub.  Pt lives alone in an apartment, reports that he does have an active ACT team working with him but that they only visit twice a month.  Pt reports that his mother is in her 59s and has Alzheimers, currently living in [facility].  He does have a brother but is not in contact with him.  No other support people identified.  Pt is vaccinated for covid but not boosted.  Pt sent out in hub for SNF.               Expected Discharge Plan: Skilled Nursing Facility Barriers to Discharge: Continued Medical Work up, SNF Pending bed offer   Patient Goals and CMS Choice Patient states their goals for this hospitalization and ongoing recovery are:: "walk good again" CMS Medicare.gov Compare Post Acute Care list provided to:: Patient Choice offered to / list presented to : Patient  Expected Discharge Plan and Services Expected Discharge Plan: [place] In-house Referral: Clinical Social Work   Post Acute Care Choice: [place] Living arrangements for the past 2 months: Apartment                                      Prior Living Arrangements/Services Living arrangements for the past 2 months: Apartment Lives with:: Self Patient language and need for interpreter reviewed:: Yes Do you feel safe going back to the place where you live?: Yes      Need for Family Participation in Patient Care: No (Comment) Care giver support system in place?: Yes (comment) Current home services: DME Kasandra Knudsen) Criminal Activity/Legal Involvement Pertinent to Current  Situation/Hospitalization: No - Comment as needed  Activities of Daily Living Home Assistive Devices/Equipment: Cane (specify quad or straight) ADL Screening (condition at time of admission) Patient's cognitive ability adequate to safely complete daily activities?: Yes Is the patient deaf or have difficulty hearing?: No Does the patient have difficulty seeing, even when wearing glasses/contacts?: No Does the patient have difficulty concentrating, remembering, or making decisions?: No Patient able to express need for assistance with ADLs?: Yes Does the patient have difficulty dressing or bathing?: No Independently performs ADLs?: Yes (appropriate for developmental age) Does the patient have difficulty walking or climbing stairs?: Yes Weakness of Legs: Both Weakness of Arms/Hands: Both  Permission Sought/Granted         Permission granted to share info w AGENCY: SNF        Emotional Assessment Appearance:: Appears stated age Attitude/Demeanor/Rapport: Engaged Affect (typically observed): Appropriate, Pleasant Orientation: : Oriented to Self, Oriented to Place, Oriented to  Time, Oriented to Situation Alcohol / Substance Use: Not Applicable Psych Involvement: Outpatient Provider (ACT team in place)  Admission diagnosis:  Ataxia [R27.0] Hip pain [M25.559] Fall [W19.XXXA] Right hip pain [M25.551] Patient Active Problem List   Diagnosis Date Noted   Ataxia 07/04/2021   Weakness of both legs 07/04/2021   MDD (major depressive disorder) 09/25/2018   GERD (gastroesophageal reflux disease)    Bipolar affective  disorder, currently active ([place]) 05/16/2017   Severe recurrent major depression without psychotic features ([place]) 11/06/2015   GAD (generalized anxiety disorder) 11/06/2015   PCP:  Benito Mccreedy, MD Pharmacy:   [place], [place] - [address], [address], [place] [address] Phone: [phone] Fax:  [phone]  [place] [address] Phone: [phone] Fax: [place], [place] [address] [place] [address] Phone: [phone] Fax: [phone]     Social Determinants of Health (SDOH) Interventions    Readmission Risk Interventions No flowsheet data found.

## 2021-07-07 NOTE — Progress Notes (Signed)
PROGRESS NOTE                                                                                                                                                                                                             Patient Demographics:    Gary [name], is a 56 y.o. male, DOB - Dec 31, 1965, VQX:[id number]  Outpatient Primary MD for the patient is Osei-Bonsu, Greggory Stallion, MD    LOS - 1  Admit date - 07/04/2021    Chief Complaint  Patient presents with   Extremity Weakness   Dizziness       Brief Narrative (HPI from H&P)  56 y.o. male with medical history significant of Bipolar disorder, anxiety who presents for evaluation of  inability to walk.  He for the last few years his lives independently in an apartment.  He lives alone.  He has been having to walk with a cane for the last few months due to soreness in his right hip and weakness of his legs.  Weakness is progressively gotten worse and today he was not able to stand up or ambulate at all.  He has had his right hip replaced.  Upon further interview he informs [place] that he has been having gradually progressive lower extremity weakness affecting both legs for the last several months to the point that now he is unable to stand up.  In the ER he was seen by neurology for progressive lower extremity weakness and admitted to the hospital for further care   Subjective:   Patient in bed, appears comfortable, denies any headache, no fever, no chest pain or pressure, no shortness of breath , no abdominal pain. No focal weakness.   Assessment  & Plan :     Bilateral lower extremity weakness with ataxia which is gradually progressive over several months in a patient with history of right hip replacement in the past - his symptoms are gradually progressive and do not sound acute however he lives alone and currently unable to stand or ambulate by himself without assistance.  Unsafe  discharge.  MRI brain and L-spine are nonacute, x-ray of the right hip nonacute.  His B12 level is borderline low and will be initiated on replacement for the same, pending methylmalonic acid levels, serial plasmin and 24-hour urine copper levels, neurology has seen the patient and his neuropathy  work-up is underway.  Will advance activity-seen by PT OT.  He will require SNF, outpatient neurology and ophthalmology follow-up.  He requires continued work-up for possible Wilson's disease.  2.  Borderline low B12 levels.  Replace, pending methylmalonic acid levels.  3.  Generalized anxiety disorder and bipolar disorder.  Continue combination of Zyprexa and his other home medications.  No acute issue.  Outpatient psych follow-up.  4. HTN  - placed on Norvasc  5. AKI -due to dehydration resolved after IV fluids.  Encouraged the patient to sit up in chair in the daytime use I-S and flutter valve for pulmonary toiletry.        Condition - Fair  Family Communication  :  None present  Code Status :  Full  Consults  :  Neuro  PUD Prophylaxis :    Procedures  :     MRI Brain - Non Acute  MRI L spine -  1. No acute abnormality within the lumbar spine. No significant disc pathology, stenosis, or evidence for neural impingement. 2. Mild-to-moderate multilevel facet hypertrophy, most notable at L5-S1 on the right. Finding could contribute to underlying back pain.       Disposition Plan  :    Status is: Observation   DVT Prophylaxis  :    enoxaparin (LOVENOX) injection 40 mg Start: 07/05/21 1000    Lab Results  Component Value Date   PLT 218 07/07/2021    Diet :  Diet Order             Diet regular Room service appropriate? Yes; Fluid consistency: Thin  Diet effective now                    Inpatient Medications  Scheduled Meds:  amLODipine  10 mg Oral Daily   enoxaparin (LOVENOX) injection  40 mg Subcutaneous Daily   escitalopram  20 mg Oral Daily   OLANZapine  5 mg  Oral QHS   sodium chloride flush  3 mL Intravenous Q12H   [START ON 07/08/2021] vitamin B-12  1,000 mcg Oral Daily   Continuous Infusions:  sodium chloride     PRN Meds:.sodium chloride, acetaminophen **OR** acetaminophen, LORazepam, sodium chloride flush  Antibiotics  :    Anti-infectives (From admission, onward)    None        Time Spent in minutes  30   Susa RaringPrashant Sula Fetterly M.D on 07/07/2021 at 12:09 PM  To page go to [email or web address]   [facility] -  Office  [phone]  See all Orders from today for further details    Objective:   Vitals:   07/06/21 2017 07/07/21 0527 07/07/21 0746 07/07/21 0937  BP: 115/80 (!) 147/98 (!) 147/97 113/75  Pulse: 73 67 71   Resp: 16 20 18    Temp: 98.7 F (37.1 C) 98.5 F (36.9 C) 97.6 F (36.4 C)   TempSrc: Oral Oral Oral   SpO2: 96% 98% 98%   Weight:      Height:        Wt Readings from Last 3 Encounters:  07/05/21 62.6 kg  04/04/20 56.2 kg  09/25/18 55.8 kg     Intake/Output Summary (Last 24 hours) at 07/07/2021 1209 Last data filed at 07/06/2021 2355 Gross per 24 hour  Intake 1670.5 ml  Output 900 ml  Net 770.5 ml     Physical Exam  Awake Alert, No new F.N deficits, Normal affect [place].AT,PERRAL Supple Neck, No JVD,   Symmetrical Chest wall movement, Good  air movement bilaterally, CTAB RRR,No Gallops, Rubs or new Murmurs,  +ve B.Sounds, Abd Soft, No tenderness,   No Cyanosis, Clubbing or edema      Data Review:    CBC Recent Labs  Lab 07/04/21 1705 07/05/21 0413 07/06/21 0157 07/07/21 0200  WBC 6.6 4.7 4.9 4.7  HGB 15.1 13.2 13.5 13.9  HCT 43.5 38.9* 39.8 39.9  PLT 255 228 211 218  MCV 94.2 93.7 95.2 93.2  MCH 32.7 31.8 32.3 32.5  MCHC 34.7 33.9 33.9 34.8  RDW 12.3 12.5 12.6 12.2  LYMPHSABS  --   --  2.0 1.9  MONOABS  --   --  0.4 0.4  EOSABS  --   --  0.2 0.1  BASOSABS  --   --  0.0 0.0    Electrolytes Recent Labs  Lab 07/04/21 1705 07/05/21 0412 07/05/21 0413 07/06/21 0157  07/07/21 0200  NA 138  --  136 137 138  K 4.3  --  3.3* 4.4 4.1  CL 102  --  101 102 103  CO2 26  --  27 28 27   GLUCOSE 127*  --  119* 100* 109*  BUN 14  --  16 18 15   CREATININE 1.39*  --  1.27* 1.67* 1.17  CALCIUM 9.5  --  9.0 9.3 9.4  AST 21  --   --  16 18  ALT 17  --   --  14 14  ALKPHOS 91  --   --  85 84  BILITOT 0.5  --   --  0.5 0.4  ALBUMIN 4.2  --   --  3.6 3.6  MG  --   --   --  1.8 1.8  TSH  --  3.033  --   --   --   BNP  --   --   --  15.8 25.7    ------------------------------------------------------------------------------------------------------------------ No results for input(s): CHOL, HDL, LDLCALC, TRIG, CHOLHDL, LDLDIRECT in the last 72 hours.  Lab Results  Component Value Date   HGBA1C 5.4 09/26/2018    Recent Labs    07/05/21 0412  TSH 3.033     Radiology Reports DG Chest 1 View  Result Date: 07/04/2021 CLINICAL DATA:  Weakness.  Dizziness. EXAM: CHEST  1 VIEW COMPARISON:  04/04/2020 FINDINGS: Heart size and mediastinal contours are unremarkable. No pleural effusion or edema identified. No airspace opacities. Visualized osseous structures are unremarkable. IMPRESSION: No acute cardiopulmonary abnormalities. Electronically Signed   By: Signa Kell M.D.   On: 07/04/2021 18:13   CT Head Wo Contrast  Result Date: 07/04/2021 CLINICAL DATA:  Neuro deficit, acute, stroke suspected. EXAM: CT HEAD WITHOUT CONTRAST TECHNIQUE: Contiguous axial images were obtained from the base of the skull through the vertex without intravenous contrast. RADIATION DOSE REDUCTION: This exam was performed according to the departmental dose-optimization program which includes automated exposure control, adjustment of the mA and/or kV according to patient size and/or use of iterative reconstruction technique. COMPARISON:  05/16/2017 FINDINGS: Brain: No abnormality seen affecting the brainstem or cerebellum. Cerebral hemispheres are normal except for focal low-density in the  external capsule on the right consistent with small vessel infarction, age indeterminate. 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 No mass lesion, hemorrhage, hydrocephalus or extra-axial collection. Vascular: No abnormal vascular finding. Skull: Normal Sinuses/Orbits: Clear/normal Other: None IMPRESSION: No acute finding by CT. 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 Electronically Signed   By: Paulina Fusi M.D.   On: 07/04/2021 18:26   MR BRAIN WO CONTRAST  Result Date: 07/05/2021 CLINICAL DATA:  Initial evaluation for neuro deficit, stroke suspected. EXAM: MRI HEAD WITHOUT CONTRAST TECHNIQUE: Multiplanar, multiecho pulse sequences of the brain and surrounding structures were obtained without intravenous contrast. COMPARISON:  Prior CT from 07/04/2021. FINDINGS: Brain: Cerebral volume within normal limits. Few scattered patchy foci of subcentimeter T2/FLAIR hyperintensity seen involving the periventricular, deep, and subcortical white matter both cerebral hemispheres, nonspecific, but most commonly related to chronic microvascular ischemic disease. Overall, appearance is mild for age. No evidence for acute or subacute infarct. Gray-white matter differentiation maintained. No encephalomalacia to suggest chronic cortical infarction. No acute or chronic intracranial blood products. No mass lesion, midline shift or mass effect. No hydrocephalus or extra-axial fluid collection. Pituitary gland and suprasellar region within normal limits. Midline structures intact. Vascular: Major intracranial vascular flow voids are maintained. Skull and upper cervical spine: Craniocervical junction normal. Bone marrow signal intensity within normal limits. No scalp soft tissue abnormality. Sinuses/Orbits: Globes orbital soft tissues within normal limits. Paranasal sinuses are  largely clear. No mastoid effusion. Other: None. IMPRESSION: 1. No acute intracranial abnormality. 2. Mild cerebral white matter disease, nonspecific, but most commonly related to chronic microvascular ischemic changes. Electronically Signed   By: Rise Mu M.D.   On: 07/05/2021 01:15   MR LUMBAR SPINE WO CONTRAST  Result Date: 07/05/2021 CLINICAL DATA:  Initial evaluation for acute ataxia. EXAM: MRI LUMBAR SPINE WITHOUT CONTRAST TECHNIQUE: Multiplanar, multisequence MR imaging of the lumbar spine was performed. No intravenous contrast was administered. COMPARISON:  None. FINDINGS: Segmentation: Standard. Lowest well-formed disc space labeled the L5-S1 level. Alignment: Mild dextroscoliosis. Alignment otherwise normal with preservation of the normal lumbar lordosis. No listhesis. Vertebrae: Vertebral body height maintained without acute or chronic fracture. Bone marrow signal intensity mildly heterogeneous but overall within normal limits. No worrisome osseous lesions or abnormal marrow edema. Conus medullaris and cauda equina: Conus extends to the L1-2 level. Conus and cauda equina appear normal. Paraspinal and other soft tissues: Paraspinous soft tissues within normal limits. Left-sided IVC noted. Visualized visceral structures otherwise unremarkable. Disc levels: T11-12: Seen only on sagittal projection. Disc desiccation with mild disc bulge. No significant stenosis. T12-L1: Unremarkable. L1-2:  Unremarkable. L2-3:  Negative interspace.  Mild facet hypertrophy.  No stenosis. L3-4:  Negative interspace.  Mild facet hypertrophy.  No stenosis. L4-5: Negative interspace. Mild right greater than left facet hypertrophy. No stenosis. L5-S1: Negative interspace. Moderate right with mild left facet hypertrophy. No stenosis. IMPRESSION: 1. No acute abnormality within the lumbar spine. No significant disc pathology, stenosis, or evidence for neural impingement. 2. Mild-to-moderate multilevel facet  hypertrophy, most notable at L5-S1 on the right. Finding could contribute to underlying back pain. Electronically Signed   By: Rise Mu M.D.   On: 07/05/2021 01:21   DG Hip Unilat W or Wo Pelvis 2-3 Views Right  Result Date: 07/04/2021 CLINICAL DATA:  Right hip arthroplasty. Patient complains of pain in the right hip. EXAM: DG HIP (WITH OR WITHOUT PELVIS) 2-3V RIGHT COMPARISON:  09/16/2018. FINDINGS: Postsurgical changes from right total hip arthroplasty identified. No signs of periprosthetic fracture or dislocation. Heterotopic bone formation is identified lateral to the femoral neck. Left hip appears unremarkable. IMPRESSION: Status post right total hip arthroplasty. No fracture or dislocation identified. Electronically Signed   By: Signa Kell M.D.   On: 07/04/2021 18:19

## 2021-07-07 NOTE — Progress Notes (Signed)
Occupational Therapy Treatment Patient Details Name: Gary [name] MRN: [id number] DOB: Dec 15, 1965 Today's Date: 07/07/2021   History of present illness Pt is a 56 y/o M presenting to ED on 2/11 c/o bilateral leg weakness and light headedness x3 months. PMH includes bipolar disorder, anxiety, depression, GERD and R THA.   OT comments  Pt progressing towards goals this session, able to ambulate for bathroom for standing ADL with RW. Pt min guard A, able to stand for 5 min for ADL without external support/LOB. Pt presenting with impairments listed below, will continue to follow. Continue to recommend HHOT at d/c.   Recommendations for follow up therapy are one component of a multi-disciplinary discharge planning process, led by the attending physician.  Recommendations may be updated based on patient status, additional functional criteria and insurance authorization.    Follow Up Recommendations  Home health OT    Assistance Recommended at Discharge Intermittent Supervision/Assistance  Patient can return home with the following  A little help with walking and/or transfers;A little help with bathing/dressing/bathroom;Assistance with cooking/housework   Equipment Recommendations  Tub/shower seat    Recommendations for Other Services      Precautions / Restrictions Precautions Precautions: Fall Restrictions Weight Bearing Restrictions: No       Mobility Bed Mobility               General bed mobility comments: pt up in chair upon arrival    Transfers Overall transfer level: Needs assistance Equipment used: Rolling walker (2 wheels) Transfers: Sit to/from Stand Sit to Stand: Supervision, Min assist                 Balance Overall balance assessment: Needs assistance Sitting-balance support: Feet supported, No upper extremity supported Sitting balance-Leahy Scale: Good Sitting balance - Comments: able to reach outside BOS for LB dressing   Standing balance  support: Bilateral upper extremity supported, During functional activity Standing balance-Leahy Scale: Fair Standing balance comment: pt able to let go of RW and complete standing grooming task for ~5 min sinkside without LOB                           ADL either performed or assessed with clinical judgement   ADL Overall ADL's : Needs assistance/impaired     Grooming: Set up;Standing Grooming Details (indicate cue type and reason): completes standing at sink             Lower Body Dressing: Sitting/lateral leans;Supervision/safety Lower Body Dressing Details (indicate cue type and reason): able to reach down and pull up socks Toilet Transfer: Nature conservation officer;Ambulation;Rolling walker (2 wheels) Toilet Transfer Details (indicate cue type and reason): simulated in room         Functional mobility during ADLs: Min guard;Rolling walker (2 wheels)      Extremity/Trunk Assessment Upper Extremity Assessment Upper Extremity Assessment: Overall WFL for tasks assessed (BUE ataxia noted at times)   Lower Extremity Assessment Lower Extremity Assessment: Defer to PT evaluation        Vision   Vision Assessment?: No apparent visual deficits   Perception Perception Perception: Not tested   Praxis Praxis Praxis: Not tested    Cognition Arousal/Alertness: Awake/alert Behavior During Therapy: Flat affect Overall Cognitive Status: No family/caregiver present to determine baseline cognitive functioning  General Comments: Pt A&O x4, follows commands consistently        Exercises      Shoulder Instructions       General Comments VSS during session    Pertinent Vitals/ Pain       Pain Assessment Pain Assessment: No/denies pain  Home Living                                          Prior Functioning/Environment              Frequency  Min 2X/week        Progress Toward  Goals  OT Goals(current goals can now be found in the care plan section)  Progress towards OT goals: Progressing toward goals  Acute Rehab OT Goals Patient Stated Goal: none stated OT Goal Formulation: With patient Time For Goal Achievement: 07/20/21 Potential to Achieve Goals: Good ADL Goals Pt Will Perform Upper Body Dressing: Independently;sitting Pt Will Perform Lower Body Dressing: Independently;sit to/from stand Pt Will Transfer to Toilet: Independently;regular height toilet;ambulating Pt Will Perform Tub/Shower Transfer: Tub transfer;rolling walker;ambulating;Independently;shower seat  Plan Discharge plan remains appropriate;Frequency remains appropriate    Co-evaluation                 AM-PAC OT "6 Clicks" Daily Activity     Outcome Measure   Help from another person eating meals?: None Help from another person taking care of personal grooming?: None Help from another person toileting, which includes using toliet, bedpan, or urinal?: A Little Help from another person bathing (including washing, rinsing, drying)?: A Little Help from another person to put on and taking off regular upper body clothing?: A Little Help from another person to put on and taking off regular lower body clothing?: A Little 6 Click Score: 20    End of Session Equipment Utilized During Treatment: Gait belt;Rolling walker (2 wheels)  OT Visit Diagnosis: Unsteadiness on feet (R26.81);Other abnormalities of gait and mobility (R26.89);Muscle weakness (generalized) (M62.81)   Activity Tolerance Patient tolerated treatment well   Patient Left in chair;with call bell/phone within reach;with chair alarm set   Nurse Communication Mobility status        Time: [id number] OT Time Calculation (min): 16 min  Charges: OT General Charges $OT Visit: 1 Visit OT Treatments $Self Care/Home Management : 8-22 mins  Lynnda Child, OTD, OTR/L Acute Rehab [phone] - [place] 07/07/2021, 12:47 PM

## 2021-07-08 LAB — COPPER, SERUM: Copper: 82 ug/dL (ref 69–132)

## 2021-07-08 LAB — UREA NITROGEN, URINE: Urea Nitrogen, Ur: 282 mg/dL

## 2021-07-08 NOTE — Plan of Care (Signed)
VSS. Ambulating in room with walker. Denies pain. Tolerating diet. Call bell within. Bed/chair alarm on.   Problem: Clinical Measurements: Goal: Ability to maintain clinical measurements within normal limits will improve Outcome: Progressing Goal: Will remain free from infection Outcome: Progressing Goal: Diagnostic test results will improve Outcome: Progressing Goal: Respiratory complications will improve Outcome: Progressing Goal: Cardiovascular complication will be avoided Outcome: Progressing   Problem: Elimination: Goal: Will not experience complications related to bowel motility Outcome: Progressing Goal: Will not experience complications related to urinary retention Outcome: Progressing   Problem: Pain Managment: Goal: General experience of comfort will improve Outcome: Progressing   Problem: Safety: Goal: Ability to remain free from injury will improve Outcome: Progressing   Problem: Skin Integrity: Goal: Risk for impaired skin integrity will decrease Outcome: Progressing

## 2021-07-08 NOTE — Progress Notes (Signed)
Occupational Therapy Treatment Patient Details Name: Gary [name] MRN: [id number] DOB: 08-Mar-1966 Today's Date: 07/08/2021   History of present illness Pt is a 56 y/o M presenting to ED on 2/11 c/o bilateral leg weakness and light headedness x3 months. PMH includes bipolar disorder, anxiety, depression, GERD and R THA.   OT comments  Pt seen for treatment session with focus of dynamic standing balance in prep for ADL/IADL task. Pt able to reach for ~5 objects at various heights within arms reach and drop them in bucket in diagonal PNF pattern. Pt able to complete task with RUE/LUE min guard without LOB. Pt uses single extremity RW support throughout. Pt also able to bring objects from tall shelf down to floor level x10 during session with min guard, no LOB noted. Pt improving with balance and mobility this session, however continues to present with impairments listed below, will follow. Continue to recommend HHOT at d/c.    Recommendations for follow up therapy are one component of a multi-disciplinary discharge planning process, led by the attending physician.  Recommendations may be updated based on patient status, additional functional criteria and insurance authorization.    Follow Up Recommendations  Home health OT    Assistance Recommended at Discharge Intermittent Supervision/Assistance  Patient can return home with the following  A little help with walking and/or transfers;A little help with bathing/dressing/bathroom;Assistance with cooking/housework   Equipment Recommendations  Tub/shower seat    Recommendations for Other Services PT consult    Precautions / Restrictions Precautions Precautions: Fall Restrictions Weight Bearing Restrictions: No       Mobility Bed Mobility               General bed mobility comments: pt up in chair upon arrival    Transfers Overall transfer level: Needs assistance Equipment used: Rolling walker (2 wheels) Transfers: Sit to/from  Stand Sit to Stand: Supervision                 Balance Overall balance assessment: Needs assistance Sitting-balance support: Feet supported, No upper extremity supported Sitting balance-Leahy Scale: Good     Standing balance support: Single extremity supported, Reliant on assistive device for balance Standing balance-Leahy Scale: Fair Standing balance comment: completed dynamic standing task without LOB                           ADL either performed or assessed with clinical judgement   ADL                           Toilet Transfer: Solicitor;Ambulation;Rolling walker (2 wheels) Toilet Transfer Details (indicate cue type and reason): simulated in room           General ADL Comments: pt able to complete simulated IADL task in room    Extremity/Trunk Assessment Upper Extremity Assessment Upper Extremity Assessment: Overall WFL for tasks assessed (mild BUE ataxia)   Lower Extremity Assessment Lower Extremity Assessment: Defer to PT evaluation        Vision   Vision Assessment?: No apparent visual deficits   Perception Perception Perception: Not tested   Praxis Praxis Praxis: Not tested    Cognition Arousal/Alertness: Awake/alert Behavior During Therapy: Flat affect Overall Cognitive Status: No family/caregiver present to determine baseline cognitive functioning  General Comments: Pt A&O x4, follows commands consistently        Exercises      Shoulder Instructions       General Comments VSS on RA    Pertinent Vitals/ Pain       Pain Assessment Pain Assessment: No/denies pain Faces Pain Scale: Hurts a little bit Pain Location: R hip Pain Descriptors / Indicators: Discomfort Pain Intervention(s): Limited activity within patient's tolerance, Monitored during session  Home Living                                          Prior  Functioning/Environment              Frequency  Min 2X/week        Progress Toward Goals  OT Goals(current goals can now be found in the care plan section)  Progress towards OT goals: Progressing toward goals  Acute Rehab OT Goals Patient Stated Goal: none stated OT Goal Formulation: With patient Time For Goal Achievement: 07/20/21 Potential to Achieve Goals: Good ADL Goals Pt Will Perform Upper Body Dressing: Independently;sitting Pt Will Perform Lower Body Dressing: Independently;sit to/from stand Pt Will Transfer to Toilet: Independently;regular height toilet;ambulating Pt Will Perform Tub/Shower Transfer: Tub transfer;rolling walker;ambulating;Independently;shower seat  Plan Discharge plan remains appropriate;Frequency remains appropriate    Co-evaluation                 AM-PAC OT "6 Clicks" Daily Activity     Outcome Measure   Help from another person eating meals?: None Help from another person taking care of personal grooming?: None Help from another person toileting, which includes using toliet, bedpan, or urinal?: A Little Help from another person bathing (including washing, rinsing, drying)?: A Little Help from another person to put on and taking off regular upper body clothing?: A Little Help from another person to put on and taking off regular lower body clothing?: A Little 6 Click Score: 20    End of Session Equipment Utilized During Treatment: Gait belt;Rolling walker (2 wheels)  OT Visit Diagnosis: Unsteadiness on feet (R26.81);Other abnormalities of gait and mobility (R26.89);Muscle weakness (generalized) (M62.81)   Activity Tolerance Patient tolerated treatment well   Patient Left in chair;with call bell/phone within reach;with chair alarm set   Nurse Communication Mobility status        Time: 1547-1600 OT Time Calculation (min): 13 min  Charges: OT General Charges $OT Visit: 1 Visit OT Treatments $Therapeutic Activity: 8-22  mins  Alfonzo Beers, OTD, OTR/L Acute Rehab [phone]   Mayer Masker 07/08/2021, 4:04 PM

## 2021-07-08 NOTE — Progress Notes (Signed)
PROGRESS NOTE                                                                                                                                                                                                             Patient Demographics:    Gary [name], is a 56 y.o. male, DOB - October 23, 1965, [id number]  Outpatient Primary MD for the patient is Osei-Bonsu, Greggory StallionGeorge, MD    LOS - 2  Admit date - 07/04/2021    Chief Complaint  Patient presents with   Extremity Weakness   Dizziness       Brief Narrative (HPI from H&P)  56 y.o. male with medical history significant of Bipolar disorder, anxiety who presents for evaluation of  inability to walk.  He for the last few years his lives independently in an apartment.  He lives alone.  He has been having to walk with a cane for the last few months due to soreness in his right hip and weakness of his legs.  Weakness is progressively gotten worse and today he was not able to stand up or ambulate at all.  He has had his right hip replaced.  Upon further interview he informs us that he has been having gradually progressive lower extremity weakness affecting both legs for the last several months to the point that now he is unable to stand up.  In the ER he was seen by neurology for progressive lower extremity weakness and admitted to the hospital for further care   Subjective:   Patient in bed no headache chest or abdominal pain, no focal weakness, bilateral lower extremity weakness which is chronic is marginally better.   Assessment  & Plan :     Bilateral lower extremity weakness with ataxia which is gradually progressive over several months in a patient with history of right hip replacement in the past - his symptoms are gradually progressive and do not sound acute however he lives alone and currently unable to stand or ambulate by himself without assistance.  Unsafe discharge.  MRI brain  and L-spine are nonacute, x-ray of the right hip nonacute.  His B12 level is borderline low and will be initiated on replacement for the same, pending methylmalonic acid levels and 24-hour urine copper levels, stable spot urine copper and serum plasmin levels, neurology has seen the patient and  his neuropathy work-up is underway.  Will advance activity-seen by PT OT, needs SNF.  Post discharge he will require continued work-up for possible Wilson's disease with outpatient neurology and ophthalmology follow-up.  2.  Borderline low B12 levels.  Replace, pending methylmalonic acid levels.  3.  Generalized anxiety disorder and bipolar disorder.  Continue combination of Zyprexa and his other home medications.  No acute issue.  Outpatient psych follow-up.  4. HTN  - placed on Norvasc  5. AKI - due to dehydration resolved after IV fluids.  Encouraged the patient to sit up in chair in the daytime use I-S and flutter valve for pulmonary toiletry.        Condition - Fair  Family Communication  :  None present  Code Status :  Full  Consults  :  Neuro  PUD Prophylaxis :    Procedures  :     MRI Brain - Non Acute  MRI L spine -  1. No acute abnormality within the lumbar spine. No significant disc pathology, stenosis, or evidence for neural impingement. 2. Mild-to-moderate multilevel facet hypertrophy, most notable at L5-S1 on the right. Finding could contribute to underlying back pain.       Disposition Plan  :    Status is: inpt  DVT Prophylaxis  :    enoxaparin (LOVENOX) injection 40 mg Start: 07/05/21 1000    Lab Results  Component Value Date   PLT 218 07/07/2021    Diet :  Diet Order             Diet regular Room service appropriate? Yes; Fluid consistency: Thin  Diet effective now                    Inpatient Medications  Scheduled Meds:  amLODipine  10 mg Oral Daily   enoxaparin (LOVENOX) injection  40 mg Subcutaneous Daily   escitalopram  20 mg Oral Daily    OLANZapine  5 mg Oral QHS   sodium chloride flush  3 mL Intravenous Q12H   vitamin B-12  1,000 mcg Oral Daily   Continuous Infusions:  sodium chloride     PRN Meds:.sodium chloride, acetaminophen **OR** acetaminophen, LORazepam, sodium chloride flush  Antibiotics  :    Anti-infectives (From admission, onward)    None        Time Spent in minutes  30   Susa Raring M.D on 07/08/2021 at 9:16 AM  To page go to [email or web address]   [facility] -  Office  [phone]  See all Orders from today for further details    Objective:   Vitals:   07/07/21 1658 07/07/21 2054 07/07/21 2301 07/08/21 0746  BP: 116/70 140/76 126/76 124/84  Pulse: 70  73 77  Resp: 16 16 17 17   Temp: 98.1 F (36.7 C) 98.3 F (36.8 C) 98.7 F (37.1 C) 98.3 F (36.8 C)  TempSrc: Oral Oral Oral Oral  SpO2: 98% 98% 96% 97%  Weight:      Height:        Wt Readings from Last 3 Encounters:  07/05/21 62.6 kg  04/04/20 56.2 kg  09/25/18 55.8 kg    No intake or output data in the 24 hours ending 07/08/21 0916    Physical Exam  Awake Alert, No new F.N deficits, Normal affect [place].AT,PERRAL Supple Neck, No JVD,   Symmetrical Chest wall movement, Good air movement bilaterally, CTAB RRR,No Gallops, Rubs or new Murmurs,  +ve B.Sounds, Abd Soft, No tenderness,  No Cyanosis, Clubbing or edema     Data Review:    CBC Recent Labs  Lab 07/04/21 1705 07/05/21 0413 07/06/21 0157 07/07/21 0200  WBC 6.6 4.7 4.9 4.7  HGB 15.1 13.2 13.5 13.9  HCT 43.5 38.9* 39.8 39.9  PLT 255 228 211 218  MCV 94.2 93.7 95.2 93.2  MCH 32.7 31.8 32.3 32.5  MCHC 34.7 33.9 33.9 34.8  RDW 12.3 12.5 12.6 12.2  LYMPHSABS  --   --  2.0 1.9  MONOABS  --   --  0.4 0.4  EOSABS  --   --  0.2 0.1  BASOSABS  --   --  0.0 0.0    Electrolytes Recent Labs  Lab 07/04/21 1705 07/05/21 0412 07/05/21 0413 07/06/21 0157 07/07/21 0200  NA 138  --  136 137 138  K 4.3  --  3.3* 4.4 4.1  CL 102  --  101 102 103   CO2 26  --  27 28 27   GLUCOSE 127*  --  119* 100* 109*  BUN 14  --  16 18 15   CREATININE 1.39*  --  1.27* 1.67* 1.17  CALCIUM 9.5  --  9.0 9.3 9.4  AST 21  --   --  16 18  ALT 17  --   --  14 14  ALKPHOS 91  --   --  85 84  BILITOT 0.5  --   --  0.5 0.4  ALBUMIN 4.2  --   --  3.6 3.6  MG  --   --   --  1.8 1.8  TSH  --  3.033  --   --   --   BNP  --   --   --  15.8 25.7    ------------------------------------------------------------------------------------------------------------------ No results for input(s): CHOL, HDL, LDLCALC, TRIG, CHOLHDL, LDLDIRECT in the last 72 hours.  Lab Results  Component Value Date   HGBA1C 5.4 09/26/2018    No results for input(s): TSH, T4TOTAL, T3FREE, THYROIDAB in the last 72 hours.  Invalid input(s): FREET3    Radiology Reports DG Chest 1 View  Result Date: 07/04/2021 CLINICAL DATA:  Weakness.  Dizziness. EXAM: CHEST  1 VIEW COMPARISON:  04/04/2020 FINDINGS: Heart size and mediastinal contours are unremarkable. No pleural effusion or edema identified. No airspace opacities. Visualized osseous structures are unremarkable. IMPRESSION: No acute cardiopulmonary abnormalities. Electronically Signed   By: 09/01/2021 M.D.   On: 07/04/2021 18:13   CT Head Wo Contrast  Result Date: 07/04/2021 CLINICAL DATA:  Neuro deficit, acute, stroke suspected. EXAM: CT HEAD WITHOUT CONTRAST TECHNIQUE: Contiguous axial images were obtained from the base of the skull through the vertex without intravenous contrast. RADIATION DOSE REDUCTION: This exam was performed according to the departmental dose-optimization program which includes automated exposure control, adjustment of the mA and/or kV according to patient size and/or use of iterative reconstruction technique. COMPARISON:  05/16/2017 FINDINGS: Brain: No abnormality seen affecting the brainstem or cerebellum. Cerebral hemispheres are normal except for focal low-density in the external capsule on the right  consistent with small vessel infarction, age indeterminate. 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 No mass lesion, hemorrhage, hydrocephalus or extra-axial collection. Vascular: No abnormal vascular finding. Skull: Normal Sinuses/Orbits: Clear/normal Other: None IMPRESSION: No acute finding by CT. 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 Electronically Signed   By: 09/01/2021  [place] M.D.   On: 07/04/2021 18:26   MR BRAIN WO CONTRAST  Result Date: 07/05/2021 CLINICAL DATA:  Initial evaluation for neuro deficit, stroke suspected. EXAM: MRI HEAD WITHOUT CONTRAST TECHNIQUE: Multiplanar, multiecho pulse sequences of the brain and surrounding structures were obtained without intravenous contrast. COMPARISON:  Prior CT from 07/04/2021. FINDINGS: Brain: Cerebral volume within normal limits. Few scattered patchy foci of subcentimeter T2/FLAIR hyperintensity seen involving the periventricular, deep, and subcortical white matter both cerebral hemispheres, nonspecific, but most commonly related to chronic microvascular ischemic disease. Overall, appearance is mild for age. No evidence for acute or subacute infarct. Gray-white matter differentiation maintained. No encephalomalacia to suggest chronic cortical infarction. No acute or chronic intracranial blood products. No mass lesion, midline shift or mass effect. No hydrocephalus or extra-axial fluid collection. Pituitary gland and suprasellar region within normal limits. Midline structures intact. Vascular: Major intracranial vascular flow voids are maintained. Skull and upper cervical spine: Craniocervical junction normal. Bone marrow signal intensity within normal limits. No scalp soft tissue abnormality. Sinuses/Orbits: Globes orbital soft tissues within normal limits. Paranasal sinuses are largely clear. No mastoid  effusion. Other: None. IMPRESSION: 1. No acute intracranial abnormality. 2. Mild cerebral white matter disease, nonspecific, but most commonly related to chronic microvascular ischemic changes. Electronically Signed   By: Rise Mu M.D.   On: 07/05/2021 01:15   MR LUMBAR SPINE WO CONTRAST  Result Date: 07/05/2021 CLINICAL DATA:  Initial evaluation for acute ataxia. EXAM: MRI LUMBAR SPINE WITHOUT CONTRAST TECHNIQUE: Multiplanar, multisequence MR imaging of the lumbar spine was performed. No intravenous contrast was administered. COMPARISON:  None. FINDINGS: Segmentation: Standard. Lowest well-formed disc space labeled the L5-S1 level. Alignment: Mild dextroscoliosis. Alignment otherwise normal with preservation of the normal lumbar lordosis. No listhesis. Vertebrae: Vertebral body height maintained without acute or chronic fracture. Bone marrow signal intensity mildly heterogeneous but overall within normal limits. No worrisome osseous lesions or abnormal marrow edema. Conus medullaris and cauda equina: Conus extends to the L1-2 level. Conus and cauda equina appear normal. Paraspinal and other soft tissues: Paraspinous soft tissues within normal limits. Left-sided IVC noted. Visualized visceral structures otherwise unremarkable. Disc levels: T11-12: Seen only on sagittal projection. Disc desiccation with mild disc bulge. No significant stenosis. T12-L1: Unremarkable. L1-2:  Unremarkable. L2-3:  Negative interspace.  Mild facet hypertrophy.  No stenosis. L3-4:  Negative interspace.  Mild facet hypertrophy.  No stenosis. L4-5: Negative interspace. Mild right greater than left facet hypertrophy. No stenosis. L5-S1: Negative interspace. Moderate right with mild left facet hypertrophy. No stenosis. IMPRESSION: 1. No acute abnormality within the lumbar spine. No significant disc pathology, stenosis, or evidence for neural impingement. 2. Mild-to-moderate multilevel facet hypertrophy, most notable at L5-S1 on  the right. Finding could contribute to underlying back pain. Electronically Signed   By: Rise Mu M.D.   On: 07/05/2021 01:21   DG Hip Unilat W or Wo Pelvis 2-3 Views Right  Result Date: 07/04/2021 CLINICAL DATA:  Right hip arthroplasty. Patient complains of pain in the right hip. EXAM: DG HIP (WITH OR WITHOUT PELVIS) 2-3V RIGHT COMPARISON:  09/16/2018. FINDINGS: Postsurgical changes from right total hip arthroplasty identified. No signs of periprosthetic fracture or dislocation. Heterotopic bone formation is identified lateral to the femoral neck. Left hip appears unremarkable. IMPRESSION: Status post right total hip arthroplasty. No fracture or dislocation identified. Electronically Signed   By: Signa Kell M.D.   On: 07/04/2021 18:19

## 2021-07-08 NOTE — TOC Progression Note (Addendum)
Transition of Care [facility]) - Progression Note    Patient Details  Name: Gary [name] MRN: [id number] Date of Birth: 05-04-1966  Transition of Care [facility]) CM/SW Contact  Joanne Chars, [place] Phone Number: 07/08/2021, 9:35 AM  Clinical Narrative:   CSW spoke with pt regarding bed offer at [facility] and pt agreeable to this.  CSW spoke with Demetra at [place] [facility] and she will initiate insurance auth.    Level 2 passr docs uploaded in [place] Must.   1030: Phone call from [place] at [place], they have insurance approval in place.  CSW informed that estimated DC is now tomorrow.   PASSR approved in [place] Must: [id number].  Expected Discharge Plan: [place] Barriers to Discharge: Continued Medical Work up, SNF Pending bed offer  Expected Discharge Plan and Services Expected Discharge Plan: [place] In-house Referral: Clinical Social Work   Post Acute Care Choice: [place] Living arrangements for the past 2 months: Apartment                                       Social Determinants of Health (SDOH) Interventions    Readmission Risk Interventions No flowsheet data found.

## 2021-07-08 NOTE — Progress Notes (Signed)
Mobility Specialist Progress Note    07/08/21 1157  Mobility  Bed Position Chair  Activity Ambulated with assistance in hallway  Level of Assistance Contact guard assist, steadying assist  Assistive Device Front wheel walker  Distance Ambulated (ft) 320 ft  Activity Response Tolerated fair  $Mobility charge 1 Mobility   Pt received in bed and agreeable. Took multiple short standing rest breaks. During walk would increasingly crouch down from weakness. Towards the end was leaning forward and walking on toes and self-corrected x1 LOB. Encouraged pt to slow down and gave postural cues throughout. Left in chair with call bell in reach.   [facility] Mobility Specialist  M.S. 5N: [phone]

## 2021-07-09 LAB — COPPER, URINE - RANDOM OR 24 HOUR
Copper / Creatinine Ratio: 13 ug/g creat (ref 0–49)
Copper, Ur: 4 ug/L
Creatinine(Crt),U: 0.32 g/L (ref 0.30–3.00)

## 2021-07-09 MED ORDER — AMLODIPINE BESYLATE 10 MG PO TABS: 10.0000 mg | ORAL_TABLET | Freq: Every day | ORAL | Status: AC

## 2021-07-09 MED ORDER — CYANOCOBALAMIN 1000 MCG PO TABS: 1000.0000 ug | ORAL_TABLET | Freq: Every day | ORAL | Status: AC

## 2021-07-09 NOTE — Progress Notes (Signed)
Physical Therapy Treatment Patient Details Name: Gary [name] MRN: [id number] DOB: 11-06-1965 Today's Date: 07/09/2021   History of Present Illness Pt is a 56 y/o M presenting to ED on 2/11 c/o bilateral leg weakness and light headedness x3 months. PMH includes bipolar disorder, anxiety, depression, GERD and R THA.    PT Comments    Pt received supine and agreeable to session with fair tolerance. Pt continues to be limited in his functional mobility by LE tremors and general fatigue. Pt needing up to mod assist for ambulation for cueing and steadying as LE tremors exacerbated by pt fatigue with increased ambulation distance. No overt LOB but noted unsteadiness during ambulation. Cueing throughout for increased step length and upright trunk, pt able to correct but unable to maintain. Pt continues to benefit from skilled PT services to progress toward functional mobility goals.    Recommendations for follow up therapy are one component of a multi-disciplinary discharge planning process, led by the attending physician.  Recommendations may be updated based on patient status, additional functional criteria and insurance authorization.  Follow Up Recommendations  Skilled nursing-short term rehab (<3 hours/day)     Assistance Recommended at Discharge Intermittent Supervision/Assistance  Patient can return home with the following A lot of help with walking and/or transfers;Assist for transportation   Equipment Recommendations  None recommended by PT    Recommendations for Other Services       Precautions / Restrictions Precautions Precautions: Fall Restrictions Weight Bearing Restrictions: No     Mobility  Bed Mobility Overal bed mobility: Modified Independent Bed Mobility: Supine to Sit     Supine to sit: Modified independent (Device/Increase time)          Transfers Overall transfer level: Needs assistance Equipment used: Rolling walker (2 wheels) Transfers: Sit to/from  Stand Sit to Stand: Supervision                Ambulation/Gait Ambulation/Gait assistance: Min guard, Min assist Gait Distance (Feet): 120 Feet Assistive device: Rolling walker (2 wheels) Gait Pattern/deviations: Festinating, Shuffle, Trunk flexed, Narrow base of support Gait velocity: Decreased     General Gait Details: pt with festinating gait, cues for longer step lengths with pt able to correct but unable to maintain. multiple standing rest breaks needed. as pt fatigues tremor increases and pt rises on up on toes and trunk flexion increases. after rest break break and cueing pt able to correct but unable to maintain   Stairs             Wheelchair Mobility    Modified Rankin (Stroke Patients Only)       Balance Overall balance assessment: Needs assistance Sitting-balance support: Feet supported, No upper extremity supported Sitting balance-Leahy Scale: Good     Standing balance support: Single extremity supported, Reliant on assistive device for balance Standing balance-Leahy Scale: Fair Standing balance comment: able to staic stand without UE support to don back gown                            Cognition Arousal/Alertness: Awake/alert Behavior During Therapy: Flat affect Overall Cognitive Status: No family/caregiver present to determine baseline cognitive functioning                                 General Comments: Pt A&O x4, follows commands consistently        Exercises General Exercises -  Lower Extremity Ankle Circles/Pumps: AROM, Both, 20 reps (noted tremors in B feet) Heel Slides: AROM, Both, 20 reps, Supine Other Exercises Other Exercises: Supine bridge x10    General Comments        Pertinent Vitals/Pain Pain Assessment Pain Assessment: No/denies pain    Home Living                          Prior Function            PT Goals (current goals can now be found in the care plan section) Acute  Rehab PT Goals PT Goal Formulation: With patient Time For Goal Achievement: 07/20/21 Potential to Achieve Goals: Fair    Frequency    Min 3X/week      PT Plan      Co-evaluation              AM-PAC PT "6 Clicks" Mobility   Outcome Measure  Help needed turning from your back to your side while in a flat bed without using bedrails?: None Help needed moving from lying on your back to sitting on the side of a flat bed without using bedrails?: None Help needed moving to and from a bed to a chair (including a wheelchair)?: A Little Help needed standing up from a chair using your arms (e.g., wheelchair or bedside chair)?: A Little Help needed to walk in hospital room?: A Lot Help needed climbing 3-5 steps with a railing? : Total 6 Click Score: 17    End of Session Equipment Utilized During Treatment: Gait belt Activity Tolerance: Other (comment);Patient limited by fatigue (limited by LE tremors) Patient left: in bed;with call bell/phone within reach;with bed alarm set Nurse Communication: Mobility status PT Visit Diagnosis: Unsteadiness on feet (R26.81);Repeated falls (R29.6);Difficulty in walking, not elsewhere classified (R26.2)     Time: 6226-3335 PT Time Calculation (min) (ACUTE ONLY): 15 min  Charges:  $Gait Training: 8-22 mins                     Lenora Boys. PTA Acute Rehabilitation Services Office: [phone]    Catalina Antigua 07/09/2021, 9:37 AM

## 2021-07-09 NOTE — Progress Notes (Signed)
Report called to Precious, LPN at [facility] [phone]. Verbalized understanding of history, follow up needs, hospitalization.

## 2021-07-09 NOTE — TOC Transition Note (Signed)
Transition of Care [facility]) - CM/SW Discharge Note   Patient Details  Name: NAREK KNISS MRN: [id number] Date of Birth: [birth year]  Transition of Care [facility]) CM/SW Contact:  Lorri Frederick, LCSW Phone Number: 07/09/2021, 8:54 AM   Clinical Narrative:   Pt discharging to [facility].  RN call report to [phone].     Final next level of care: Skilled Nursing Facility Barriers to Discharge: Barriers Resolved   Patient Goals and CMS Choice Patient states their goals for this hospitalization and ongoing recovery are:: "walk good again" CMS Medicare.gov Compare Post Acute Care list provided to:: Patient Choice offered to / list presented to : Patient  Discharge Placement              Patient chooses bed at:  ([facility]) Patient to be transferred to facility by: [facility] Name of family member notified: none identified Patient and family notified of of transfer: 07/09/21 (pt only)  Discharge Plan and Services In-house Referral: Clinical Social Work   Post Acute Care Choice: Skilled Nursing Facility                               Social Determinants of Health (SDOH) Interventions     Readmission Risk Interventions No flowsheet data found.

## 2021-07-09 NOTE — Discharge Summary (Signed)
Gary LappingKevin [name] [name] ZOX:[id number] DOB: 1965-12-11 DOA: 07/04/2021  PCP: Gary [name]  Admit date: 07/04/2021  Discharge date: 07/09/2021  Admitted From: Home   Disposition:  SNF   Recommendations for Outpatient Follow-up:   Follow up with PCP in 1-2 weeks  PCP Please obtain BMP/CBC, 2 view CXR in 1week,  (see Discharge instructions)   PCP Please follow up on the following pending results: Monitor pending methylmalonic acid levels and 24-hour urine copper levels, spot Ur copper, results, needs Neuro and Ophthalmology follow up in 1-2 weeks.   Home Health: None   Equipment/Devices: None  Consultations: Neuro Discharge Condition: Stable    CODE STATUS: Full    Diet Recommendation: Heart Healthy   Diet Order             Diet - low sodium heart healthy           Diet regular Room service appropriate? Yes; Fluid consistency: Thin  Diet effective now                    Chief Complaint  Patient presents with   Extremity Weakness   Dizziness     Brief history of present illness from the day of admission and additional interim summary    56 y.o. male with medical history significant of Bipolar disorder, anxiety who presents for evaluation of  inability to walk.  He for the last few years his lives independently in an apartment.  He lives alone.  He has been having to walk with a cane for the last few months due to soreness in his right hip and weakness of his legs.  Weakness is progressively gotten worse and today he was not able to stand up or ambulate at all.  He has had his right hip replaced.   Upon further interview he informs us that he has been having gradually progressive lower extremity weakness affecting both legs for the last several months to the point that now he is unable to stand up.  In  the ER he was seen by neurology for progressive lower extremity weakness and admitted to the hospital for further care                                                                  Hospital Course      Bilateral lower extremity weakness with ataxia which is gradually progressive over several months in a patient with history of right hip replacement in the past - his symptoms are gradually progressive and do not sound acute however he lives alone and currently unable to stand or ambulate by himself without assistance.  Unsafe discharge.  MRI brain and L-spine are nonacute, x-ray of the right hip nonacute.  His B12 level is borderline low and will be  initiated on replacement for the same, pending spot Ur copper, methylmalonic acid levels and 24-hour urine copper levels, stable serum copper and serum ceruplasmin levels, neurology has seen the patient and his neuropathy work-up needs to continue outpt.  Seen by PT OT, needs SNF.  Post discharge he will require continued work-up for possible Wilson's disease with outpatient neurology and ophthalmology follow-up. Note no back pain.   2.  Borderline low B12 levels.  Replace, pending methylmalonic acid levels.   3.  Generalized anxiety disorder and bipolar disorder.  Continue combination of Zyprexa and his other home medications.  No acute issue.  Outpatient psych follow-up.   4. HTN  - placed on Norvasc   5. AKI - due to dehydration resolved after IV fluids.  6. Non Specific MRI L Spine DJD - no pain, follow outpt with Neuro if needed then NS.   Discharge diagnosis     Principal Problem:   Ataxia Active Problems:   GAD (generalized anxiety disorder)   Bipolar affective disorder, currently active (HCC)   Weakness of both legs    Discharge instructions    Discharge Instructions     Diet - low sodium heart healthy   Complete by: As directed    Discharge instructions   Complete by: As directed    Follow with Primary [name] Gary Plum, [name] in 7 days   Get CBC, CMP, Magnesium, TSH -  checked next visit within 1 week by SNF [name]    Activity: As tolerated with Full fall precautions use walker/cane & assistance as needed  Disposition SNF  Diet: Heart Healthy    Special Instructions: If you have smoked or chewed Tobacco  in the last 2 yrs please stop smoking, stop any regular Alcohol  and or any Recreational drug use.  On your next visit with your primary care physician please Get Medicines reviewed and adjusted.  Please request your Prim.[name] to go over all Hospital Tests and Procedure/Radiological results at the follow up, please get all Hospital records sent to your Prim [name] by signing hospital release before you go home.  If you experience worsening of your admission symptoms, develop shortness of breath, life threatening emergency, suicidal or homicidal thoughts you must seek medical attention immediately by calling 911 or calling your [name] immediately  if symptoms less severe.  You Must read complete instructions/literature along with all the possible adverse reactions/side effects for all the Medicines you take and that have been prescribed to you. Take any new Medicines after you have completely understood and accpet all the possible adverse reactions/side effects.   Increase activity slowly   Complete by: As directed        Discharge Medications   Allergies as of 07/09/2021       Reactions   Bee Venom Swelling   Swelling at site of sting   Erythromycin Diarrhea        Medication List     STOP taking these medications    benztropine 0.5 MG tablet Commonly known as: COGENTIN   famotidine 20 MG tablet Commonly known as: PEPCID   gabapentin 400 MG capsule Commonly known as: NEURONTIN       TAKE these medications    amLODipine 10 MG tablet Commonly known as: NORVASC Take 1 tablet (10 mg total) by mouth daily.   cyanocobalamin 1000 MCG tablet Take 1 tablet (1,000 mcg total) by mouth  daily.   escitalopram 20 MG tablet Commonly known as: LEXAPRO Take 1 tablet (20 mg  total) by mouth daily. For depression/anxiety   OLANZapine 5 MG tablet Commonly known as: ZYPREXA Take 1 tablet (5 mg total) by mouth at bedtime. For mood         Follow-up Information     Gary [name]. Schedule an appointment as soon as possible for a visit in 1 week(s).   Specialty: Internal Medicine Why: Get CBC, CMP, B12, TSH, checked, get referral to a neurologist and ophthalmologist for possible Wilson's disease work-up. Contact information: [address] [place] [address] [phone]         [facility]. Schedule an appointment as soon as possible for a visit in 1 week(s).   Why: Peripheral neuropathy, suspicious for Wilson's disease Contact information: [address]     [address] [place] [address] [phone]        Sallye Lat, [name]. Schedule an appointment as soon as possible for a visit in 1 week(s).   Specialty: Ophthalmology Why: Eye exam for possible Wilson's disease Contact information: [address] [place] [address] [phone]                 Major procedures and Radiology Reports - PLEASE review detailed and final reports thoroughly  -        DG Chest 1 View  Result Date: 07/04/2021 CLINICAL DATA:  Weakness.  Dizziness. EXAM: CHEST  1 VIEW COMPARISON:  04/04/2020 FINDINGS: Heart size and mediastinal contours are unremarkable. No pleural effusion or edema identified. No airspace opacities. Visualized osseous structures are unremarkable. IMPRESSION: No acute cardiopulmonary abnormalities. Electronically Signed   By: Signa Kell M.D.   On: 07/04/2021 18:13   CT Head Wo Contrast  Result Date: 07/04/2021 CLINICAL DATA:  Neuro deficit, acute, stroke suspected. EXAM: CT HEAD WITHOUT CONTRAST TECHNIQUE: Contiguous axial images were obtained from the base of the  skull through the vertex without intravenous contrast. RADIATION DOSE REDUCTION: This exam was performed according to the departmental dose-optimization program which includes automated exposure control, adjustment of the mA and/or kV according to patient size and/or use of iterative reconstruction technique. COMPARISON:  05/16/2017 FINDINGS: Brain: No abnormality seen affecting the brainstem or cerebellum. Cerebral hemispheres are normal except for focal low-density in the external capsule on the right consistent with small vessel infarction, age indeterminate. 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 No mass lesion, hemorrhage, hydrocephalus or extra-axial collection. Vascular: No abnormal vascular finding. Skull: Normal Sinuses/Orbits: Clear/normal Other: None IMPRESSION: No acute finding by CT. 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 Electronically Signed   By: Paulina Fusi M.D.   On: 07/04/2021 18:26   MR BRAIN WO CONTRAST  Result Date: 07/05/2021 CLINICAL DATA:  Initial evaluation for neuro deficit, stroke suspected. EXAM: MRI HEAD WITHOUT CONTRAST TECHNIQUE: Multiplanar, multiecho pulse sequences of the brain and surrounding structures were obtained without intravenous contrast. COMPARISON:  Prior CT from 07/04/2021. FINDINGS: Brain: Cerebral volume within normal limits. Few scattered patchy foci of subcentimeter T2/FLAIR hyperintensity seen involving the periventricular, deep, and subcortical white matter both cerebral hemispheres, nonspecific, but most commonly related to chronic microvascular ischemic disease. Overall, appearance is mild for age. No evidence for acute or subacute infarct. Gray-white matter differentiation maintained. No encephalomalacia to suggest chronic cortical infarction. No acute or chronic intracranial blood products. No  mass lesion, midline shift or mass effect.  No hydrocephalus or extra-axial fluid collection. Pituitary gland and suprasellar region within normal limits. Midline structures intact. Vascular: Major intracranial vascular flow voids are maintained. Skull and upper cervical spine: Craniocervical junction normal. Bone marrow signal intensity within normal limits. No scalp soft tissue abnormality. Sinuses/Orbits: Globes orbital soft tissues within normal limits. Paranasal sinuses are largely clear. No mastoid effusion. Other: None. IMPRESSION: 1. No acute intracranial abnormality. 2. Mild cerebral white matter disease, nonspecific, but most commonly related to chronic microvascular ischemic changes. Electronically Signed   By: Rise MuBenjamin  McClintock M.D.   On: 07/05/2021 01:15   MR LUMBAR SPINE WO CONTRAST  Result Date: 07/05/2021 CLINICAL DATA:  Initial evaluation for acute ataxia. EXAM: MRI LUMBAR SPINE WITHOUT CONTRAST TECHNIQUE: Multiplanar, multisequence MR imaging of the lumbar spine was performed. No intravenous contrast was administered. COMPARISON:  None. FINDINGS: Segmentation: Standard. Lowest well-formed disc space labeled the L5-S1 level. Alignment: Mild dextroscoliosis. Alignment otherwise normal with preservation of the normal lumbar lordosis. No listhesis. Vertebrae: Vertebral body height maintained without acute or chronic fracture. Bone marrow signal intensity mildly heterogeneous but overall within normal limits. No worrisome osseous lesions or abnormal marrow edema. Conus medullaris and cauda equina: Conus extends to the L1-2 level. Conus and cauda equina appear normal. Paraspinal and other soft tissues: Paraspinous soft tissues within normal limits. Left-sided IVC noted. Visualized visceral structures otherwise unremarkable. Disc levels: T11-12: Seen only on sagittal projection. Disc desiccation with mild disc bulge. No significant stenosis. T12-L1: Unremarkable. L1-2:  Unremarkable. L2-3:  Negative  interspace.  Mild facet hypertrophy.  No stenosis. L3-4:  Negative interspace.  Mild facet hypertrophy.  No stenosis. L4-5: Negative interspace. Mild right greater than left facet hypertrophy. No stenosis. L5-S1: Negative interspace. Moderate right with mild left facet hypertrophy. No stenosis. IMPRESSION: 1. No acute abnormality within the lumbar spine. No significant disc pathology, stenosis, or evidence for neural impingement. 2. Mild-to-moderate multilevel facet hypertrophy, most notable at L5-S1 on the right. Finding could contribute to underlying back pain. Electronically Signed   By: Rise MuBenjamin  McClintock M.D.   On: 07/05/2021 01:21   DG Hip Unilat W or Wo Pelvis 2-3 Views Right  Result Date: 07/04/2021 CLINICAL DATA:  Right hip arthroplasty. Patient complains of pain in the right hip. EXAM: DG HIP (WITH OR WITHOUT PELVIS) 2-3V RIGHT COMPARISON:  09/16/2018. FINDINGS: Postsurgical changes from right total hip arthroplasty identified. No signs of periprosthetic fracture or dislocation. Heterotopic bone formation is identified lateral to the femoral neck. Left hip appears unremarkable. IMPRESSION: Status post right total hip arthroplasty. No fracture or dislocation identified. Electronically Signed   By: Signa Kellaylor  Stroud M.D.   On: 07/04/2021 18:19     Today   Subjective    Caryn BeeKevin Nola today has no headache,no chest abdominal pain,no new weakness tingling or numbness, feels much better wants to go home today.      Objective   Blood pressure (!) 143/83, pulse 67, temperature 98.1 F (36.7 C), temperature source Oral, resp. rate 17, height 5\' 3"  (1.6 m), weight 62.6 kg, SpO2 97 %.  No intake or output data in the 24 hours ending 07/09/21 0813  Exam  Awake Alert, No new F.N deficits, chronic lower ext. Weakness 4.5/5 bilat   [place].AT,PERRAL Supple Neck,   Symmetrical Chest wall movement, Good air movement bilaterally, CTAB RRR,No Gallops,   +ve B.Sounds, Abd Soft, Non tender,  No Cyanosis,  Clubbing or edema    Data Review   CBC w Diff:  Lab Results  Component Value Date  WBC 4.7 07/07/2021   HGB 13.9 07/07/2021   HCT 39.9 07/07/2021   PLT 218 07/07/2021   LYMPHOPCT 40 07/07/2021   MONOPCT 9 07/07/2021   EOSPCT 3 07/07/2021   BASOPCT 1 07/07/2021    CMP:  Lab Results  Component Value Date   NA 138 07/07/2021   K 4.1 07/07/2021   CL 103 07/07/2021   CO2 27 07/07/2021   BUN 15 07/07/2021   CREATININE 1.17 07/07/2021   PROT 5.9 (L) 07/07/2021   ALBUMIN 3.6 07/07/2021   BILITOT 0.4 07/07/2021   ALKPHOS 84 07/07/2021   AST 18 07/07/2021   ALT 14 07/07/2021  .   Total Time in preparing paper work, data evaluation and todays exam - 35 minutes  Susa Raring M.D on 07/09/2021 at 8:13 AM  [facility]

## 2021-07-10 LAB — METHYLMALONIC ACID, SERUM: Methylmalonic Acid, Quantitative: 273 nmol/L (ref 0–378)

## 2021-07-16 ENCOUNTER — Ambulatory Visit: Payer: Medicaid Other | Admitting: Neurology

## 2021-08-24 ENCOUNTER — Encounter: Payer: Self-pay | Admitting: Neurology

## 2021-08-24 ENCOUNTER — Ambulatory Visit (INDEPENDENT_AMBULATORY_CARE_PROVIDER_SITE_OTHER): Payer: Medicaid Other | Admitting: Neurology

## 2021-08-24 VITALS — BP 117/75 | HR 62 | Ht 63.0 in

## 2021-08-24 DIAGNOSIS — E782 Mixed hyperlipidemia: Secondary | ICD-10-CM | POA: Insufficient documentation

## 2021-08-24 DIAGNOSIS — G20 Parkinson's disease: Secondary | ICD-10-CM | POA: Diagnosis not present

## 2021-08-24 DIAGNOSIS — E559 Vitamin D deficiency, unspecified: Secondary | ICD-10-CM | POA: Insufficient documentation

## 2021-08-24 DIAGNOSIS — R2689 Other abnormalities of gait and mobility: Secondary | ICD-10-CM | POA: Insufficient documentation

## 2021-08-24 DIAGNOSIS — I517 Cardiomegaly: Secondary | ICD-10-CM | POA: Insufficient documentation

## 2021-08-24 DIAGNOSIS — N400 Enlarged prostate without lower urinary tract symptoms: Secondary | ICD-10-CM | POA: Insufficient documentation

## 2021-08-24 DIAGNOSIS — G629 Polyneuropathy, unspecified: Secondary | ICD-10-CM | POA: Insufficient documentation

## 2021-08-24 DIAGNOSIS — G20C Parkinsonism, unspecified: Secondary | ICD-10-CM | POA: Insufficient documentation

## 2021-08-24 DIAGNOSIS — R61 Generalized hyperhidrosis: Secondary | ICD-10-CM | POA: Insufficient documentation

## 2021-08-24 DIAGNOSIS — R1013 Epigastric pain: Secondary | ICD-10-CM | POA: Insufficient documentation

## 2021-08-24 DIAGNOSIS — K589 Irritable bowel syndrome without diarrhea: Secondary | ICD-10-CM | POA: Insufficient documentation

## 2021-08-24 DIAGNOSIS — N3941 Urge incontinence: Secondary | ICD-10-CM | POA: Insufficient documentation

## 2021-08-24 MED ORDER — BENZTROPINE MESYLATE 1 MG PO TABS: 1.0000 mg | ORAL_TABLET | Freq: Two times a day (BID) | ORAL | 11 refills | Status: DC

## 2021-08-24 MED ORDER — CARBIDOPA-LEVODOPA 25-100 MG PO TABS: 1.0000 | ORAL_TABLET | Freq: Three times a day (TID) | ORAL | 10 refills | Status: DC

## 2021-08-24 NOTE — Progress Notes (Signed)
? ?Chief Complaint  ?Patient presents with  ? New Patient (Initial Visit)  ?  Rm 15. Alone. 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  ? ? ? ? ?ASSESSMENT AND PLAN ? ?Gary [name] is a 56 y.o. male   ?Parkinsonism ? Long history of psychiatric medication treatment, secondary versus primary ? Sinemet 25/100 mg 3 times daily ? Previously responded well to Cogentin, add on Cogentin 1 mg daily ? Physical therapy ? Return to clinic in 6 months ? ?DIAGNOSTIC DATA (LABS, IMAGING, TESTING) ?- I reviewed patient records, labs, notes, testing and imaging myself where available. ? ? ?MEDICAL HISTORY: ? ?Gary [name] is a 56 year old male, seen in request by his primary care physician Dr. Jackie [name], Gary [name], for gait abnormality, initial evaluation was on August 24, 2021. ? ?I reviewed and summarized the referring note. PMHX. ?Bipolar disorder, on Zyprexa and home medications ?Right hip replacement ?Hypertension, newly diagnosed, was placed on Norvasc ? ?Patient was brought in by rehab transportation, alone at today's visit, he was able to provide a reasonable medical history ? ?He had lifelong history of mood disorder, was treated with different medications in the past, he can only require Depakote, but not all of the medications name, currently on Zyprexa 5 mg every day, Lexapro 20 mg daily for many years ? 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 ?Postsurgical follow-up in January 2020 showed excellent motion without pain, he lives alone at his apartment, ? ?Reported since fall 2022, he began to notice gradual onset of memory loss, shuffling, tendency to walk faster and  faster, sometimes fell face forward, he does not drive, never had a job, he used to take taxi to go to grocery shopping every Friday, began to use [employer] electronic cart, ? ?On July 04, 2021, because increased gait abnormality, he called 911 ? ?I personally reviewed MRI of the brain on July 04, 2021, no acute intracranial abnormality, mild small vessel disease ?MRI of lumbar, mild degenerative changes, no evidence of canal or foraminal narrowing ? ?X-ray right hip: s/p right total hip arthroplasty. ? ?Laboratory evaluations: Normal CBC, hemoglobin 13.9, BNP, CMP, creatinine 1.17, decreased total protein of 5.9, normal normal magnesium, Uric acid, urine random copper level, serum copper level, ceruloplasmin level, TSH, HIV, methylmalonic acid level, ? ?Since then, he was discharged to outpatient rehab on July 09, 2021, ? ?He denies significant pain, denies sensory loss, no bowel and bladder incontinence. ? ?PHYSICAL EXAM: ?  ?Vitals:  ? 08/24/21 0821  ?BP: 117/75  ?Pulse: 62  ?Height: 5\' 3"  (1.6 m)  ? ?Not recorded ?  ? ? ?Body mass index is 24.45 kg/m?. ? ?PHYSICAL EXAMNIATION: ? ?Gen: NAD, conversant, well nourised, well groomed                     ?Cardiovascular: Regular rate rhythm, no peripheral edema, warm, nontender. ?Eyes: Conjunctivae clear without exudates or hemorrhage ?Neck: Supple, no carotid bruits. ?Pulmonary: Clear to auscultation bilaterally  ? ?NEUROLOGICAL EXAM: ? ?MENTAL STATUS: Decreased facial expression, masked face, soft voice ?Speech: ?   Speech is normal; fluent and spontaneous with normal comprehension.  ?Cognition: ?  Orientation to time, place and person ?    Normal recent and remote memory ?    Normal Attention span and concentration ?    Normal Language, naming, repeating,spontaneous speech ?    Fund of knowledge ?  ?CRANIAL NERVES: ?CN II: Visual fields are full to confrontation. Pupils are round equal and briskly reactive to light. ?CN III, IV, VI: extraocular movement  are normal. No ptosis. ?CN V: Facial sensation is intact to light touch ?CN VII: Face is symmetric with normal eye closure  ?CN VIII: Hearing is normal to causal conversation. ?CN IX, X: Phonation is normal. ?CN XI: Head turning and shoulder shrug are intact ? ?MOTOR: Normal strength, left more than right rigidity, bradykinesia, ? ?REFLEXES: ?Reflexes are 2+ and symmetric at the biceps, triceps, knees, and ankles. Plantar responses are extensor bilaterally ? ?SENSORY: ?Intact to light touch, pinprick and vibratory sensation are intact in fingers and toes. ? ?COORDINATION: ?There is no trunk or limb dysmetria noted. ? ?GAIT/STANCE: He needs push-up to get up from sitting position, left hand in flexion, small shuffling gait, en bloc turning, decreased arm swing  ? ?REVIEW OF SYSTEMS:  ?Full 14 system review of systems performed and notable only for as above ?All other review of systems were negative. ? ? ?ALLERGIES: ?Allergies  ?Allergen Reactions  ? Bee Venom Swelling  ?  Swelling at site of sting  ? Erythromycin Diarrhea  ? ? ?HOME MEDICATIONS: ?Current Outpatient Medications  ?Medication Sig Dispense Refill  ? amLODipine (NORVASC) 10 MG tablet Take 1 tablet (10 mg total) by mouth daily.    ? escitalopram (LEXAPRO) 20 MG tablet Take 1 tablet (20 mg total) by mouth daily. For depression/anxiety 30 tablet 0  ? OLANZapine (ZYPREXA) 5 MG tablet Take 1 tablet (5 mg total) by mouth at bedtime. For mood 30 tablet 0  ? vitamin B-12 1000 MCG tablet Take 1 tablet (1,000 mcg total) by mouth daily.    ? ?No current facility-administered medications for this visit.  ? ? ?PAST MEDICAL HISTORY: ?Past Medical History:  ?Diagnosis Date  ? Anxiety   ? Bipolar 2 disorder (HCC)   ? Depression   ? GERD (gastroesophageal reflux disease)   ? Hyperhydrosis disorder   ? ? ?PAST SURGICAL HISTORY: ?Past Surgical History:  ?Procedure Laterality Date  ? HERNIA REPAIR    ? HIP SURGERY Right   ? LEFT HEART CATH AND CORONARY ANGIOGRAPHY N/A  09/19/2018  ? Procedure: LEFT HEART CATH AND CORONARY ANGIOGRAPHY;  Surgeon: [name], MD;  Location: [facility] INVASIVE CV LAB;  Service: Cardiovascular;  Laterality: N/A;  ? ? ?FAMILY HISTORY: ?History reviewed. No pertinent family history. ? ?SOCIAL HISTORY: ?Social History  ? ?Socioeconomic History  ? Marital status: Single  ?  Spouse name: Not on file  ? Number of children: Not on file  ? Years of education: Not on file  ? Highest education level: Not on file  ?Occupational History  ? Not on file  ?Tobacco Use  ? Smoking status: Never  ? Smokeless tobacco: Never  ?Vaping Use  ? Vaping Use: Never used  ?Substance and Sexual Activity  ? Alcohol use: No  ? Drug use: No  ? Sexual activity: Not on file  ?Other Topics Concern  ? Not on file  ?Social History Narrative  ? Not on file  ? ?Social Determinants of Health  ? ?Financial Resource Strain: Not on file  ?Food Insecurity: Not on file  ?Transportation Needs: Not on file  ?  Physical Activity: Not on file  ?Stress: Not on file  ?Social Connections: Not on file  ?Intimate Partner Violence: Not on file  ? ? ? ? ?Levert Feinstein, M.D. Ph.D. ? ?[facility] ?[address], [address] ?[place], [place] [address] ?Ph: [phone] ?Fax: [phone] ? ?CC:  Gary Plum, MD ?[phone] ?[address] ?[place],  [place] [id number]  Gary Plum, MD   ?

## 2021-10-06 ENCOUNTER — Other Ambulatory Visit: Payer: Self-pay | Admitting: Neurology

## 2021-10-07 ENCOUNTER — Other Ambulatory Visit: Payer: Self-pay | Admitting: Neurology

## 2021-10-07 ENCOUNTER — Other Ambulatory Visit: Payer: Self-pay | Admitting: *Deleted

## 2021-10-07 MED ORDER — CARBIDOPA-LEVODOPA 25-100 MG PO TABS
1.0000 | ORAL_TABLET | Freq: Three times a day (TID) | ORAL | 11 refills | Status: DC
Start: 2021-10-07 — End: 2022-08-30

## 2021-10-08 ENCOUNTER — Other Ambulatory Visit: Payer: Self-pay

## 2021-10-08 MED ORDER — BENZTROPINE MESYLATE 1 MG PO TABS: 1.0000 mg | ORAL_TABLET | Freq: Two times a day (BID) | ORAL | 11 refills | Status: DC

## 2021-10-21 ENCOUNTER — Telehealth: Payer: Self-pay | Admitting: Neurology

## 2021-10-21 NOTE — Telephone Encounter (Signed)
I spoke to the patient. He started both Sinemet 25-100mg , one tab TID and Cogentin 1mg , one tab BID after his last visit on 08/24/21. He was tolerating the medications without issues. He has also been on Cogentin in the past with no problems.  However, for the last two weeks, he has been experiencing "prickly" feelings in his head, decreased taste sensation, blurred vision and extreme dry  mouth.   He would like to stay on the Sinemet and Cogentin, if possible. Feels they have provided therapeutic benefit. ___________________________________ He has not been given any new medications but did see his PCP and was restarted on the following: amlodipine 10mg , dicyclomine (could not remember dose), tamsulosin 0.4mg . He had been off these meds for a while but never had a problem taking them in the past.  He has also continued escitalopram 20mg , olanzapine 5mg  and B12 without interruption.

## 2021-10-21 NOTE — Telephone Encounter (Signed)
Pt states he is having a funny feeling in his head(a prickly feeling), unable to taste anything, dry mouth and blurry vision.  Pt questioning if this is because of :carbidopa-levodopa (SINEMET IR) 25-100 MG tablet & benztropine (COGENTIN) 1 MG tablet, he states he did not have these issues before he started these medications, please call.

## 2021-10-22 NOTE — Telephone Encounter (Signed)
I spoke with Gary [name] and his nurse, [place]. I let him know that we have sent his concern to Dr. Terrace Arabia to review.  Gary [name] continued to state that he felt the Sinemet and Cogentin have been beneficial. He stated the side effects have been very persistent thoughout the last few weeks.  His nurse, [place] expressed concern about the patient's blurry vision. He stated patient's vitals are in normal range. He is requesting something that could counteract the patient's side effects.

## 2021-10-22 NOTE — Telephone Encounter (Signed)
I spoke to the patient. He is agreeable to stop the Cogentin for now. He will just continue the Sinemet, as prescribed. If his parkinsonism worsens, he will call our office back. Per vo by Dr Terrace Arabia, she may restart Cogentin 0.5 tabs BID.

## 2021-10-22 NOTE — Telephone Encounter (Signed)
Please advise him to stop Cogentin, keep current dose of Sinemet.

## 2021-12-17 ENCOUNTER — Encounter: Payer: Self-pay | Admitting: Neurology

## 2021-12-17 NOTE — Telephone Encounter (Signed)
error 

## 2022-02-24 ENCOUNTER — Encounter: Payer: Self-pay | Admitting: Adult Health

## 2022-02-24 ENCOUNTER — Ambulatory Visit (INDEPENDENT_AMBULATORY_CARE_PROVIDER_SITE_OTHER): Payer: Medicaid Other | Admitting: Adult Health

## 2022-02-24 VITALS — BP 118/78 | HR 86 | Ht 63.0 in | Wt 120.6 lb

## 2022-02-24 DIAGNOSIS — G20C Parkinsonism, unspecified: Secondary | ICD-10-CM | POA: Diagnosis not present

## 2022-02-24 NOTE — Progress Notes (Signed)
[facility] [address] [place].  [address] [phone]       OFFICE FOLLOW UP NOTE  Mr. Anthonymichael Munday Hackler Date of Birth:  02-11-1966 Medical Record Number:  [id number]    Primary neurologist: Dr. Terrace Arabia Reason for visit: Parkinsonism    SUBJECTIVE:   CHIEF COMPLAINT:  Chief Complaint  Patient presents with   Parkinsonism    R 3, 6 month FU "having side effects from medicine, not sure which one- sinemet or cogentin which were started in April; dry mouth, funny feeling in my head, no taste, decreased appetite, constipation, blurred vision-saw eye dr in April and only readers needed"    HPI:   TERRILL ALPERIN is a 56 year old male, seen in request by his primary care physician Dr. Jackie Plum, for gait abnormality, initial evaluation was on August 24, 2021 with Dr. Terrace Arabia   I reviewed and summarized the referring note. PMHX. Bipolar disorder, on Zyprexa and home medications Right hip replacement Hypertension, newly diagnosed, was placed on Norvasc   Consult visit 08/24/2021 Dr. Terrace Arabia: Patient was brought in by rehab transportation, alone at today's visit, he was able to provide a reasonable medical history  He had lifelong history of mood disorder, was treated with different medications in the past, he can only require Depakote, but not all of the medications name, currently on Zyprexa 5 mg every day, Lexapro 20 mg daily for many years  He used to live with his mother, but had a huge conflict with her, then lived alone over the past 3 to 4 years, suffered a traumatic injury of right hip, had ORIF of right femur fracture, in November 2019 has to right hip replacement due to 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 and retained painful orthopedic implant of deep screws  Postsurgical follow-up in January 2020 showed excellent motion without pain, he lives alone at his apartment,  Reported since fall 2022, he began to notice  gradual onset of memory loss, shuffling, tendency to walk faster and faster, sometimes fell face forward, he does not drive, never had a job, he used to take taxi to go to grocery shopping every Friday, began to use [employer],  On July 04, 2021, because increased gait abnormality, he called 911  I personally reviewed MRI of the brain on July 04, 2021, no acute intracranial abnormality, mild small vessel disease MRI of lumbar, mild degenerative changes, no evidence of canal or foraminal narrowing   X-ray right hip: s/p right total hip arthroplasty.  Laboratory evaluations: Normal CBC, hemoglobin 13.9, BNP, CMP, creatinine 1.17, decreased total protein of 5.9, normal normal magnesium, Uric acid, urine random copper level, serum copper level, ceruloplasmin level, TSH, HIV, methylmalonic acid level,   Since then, he was discharged to outpatient rehab on July 09, 2021,  He denies significant pain, denies sensory loss, no bowel and bladder incontinence.    Update 02/24/2022 JM: Patient returns for follow-up visit after prior initial visit with Dr. Terrace Arabia 6 months ago.  Was started on Sinemet and cogentin at prior visit, patient called office beginning of June with concerns of potential medication side effects ("prickly" feeling in head, decreased taste, blurred vision and dry mouth) and was advised to discontinue Cogentin but he has not yet stopped this medication.  He remains on both Cogentin and Sinemet, he continues to experience side effects.  He does believe these medications have been beneficial in regards to his gait and movements.  He does  have some imbalance, has had a couple recent falls thankfully without significant injury.      ROS:   14 system review of systems performed and negative with exception of those listed in HPI  PMH:  Past Medical History:  Diagnosis Date   Anxiety    Bipolar 2 disorder (HCC)    Depression    GERD (gastroesophageal reflux  disease)    Hyperhydrosis disorder    Parkinsonism     PSH:  Past Surgical History:  Procedure Laterality Date   HERNIA REPAIR     HIP SURGERY Right    LEFT HEART CATH AND CORONARY ANGIOGRAPHY N/A 09/19/2018   Procedure: LEFT HEART CATH AND CORONARY ANGIOGRAPHY;  Surgeon: [name], MD;  Location: [facility];  Service: Cardiovascular;  Laterality: N/A;    Social History:  Social History   Socioeconomic History   Marital status: Single    Spouse name: Not on file   Number of children: 0   Years of education: Not on file   Highest education level: Not on file  Occupational History   Not on file  Tobacco Use   Smoking status: Never   Smokeless tobacco: Never  Vaping Use   Vaping Use: Never used  Substance and Sexual Activity   Alcohol use: No   Drug use: No   Sexual activity: Not on file  Other Topics Concern   Not on file  Social History Narrative   02/24/22 Lives alone   Social Determinants of Health   Financial Resource Strain: Not on file  Food Insecurity: Not on file  Transportation Needs: Not on file  Physical Activity: Not on file  Stress: Not on file  Social Connections: Not on file  Intimate Partner Violence: Not on file    Family History: History reviewed. No pertinent family history.  Medications:   Current Outpatient Medications on File Prior to Visit  Medication Sig Dispense Refill   amLODipine (NORVASC) 10 MG tablet Take 1 tablet (10 mg total) by mouth daily.     benztropine (COGENTIN) 1 MG tablet Take 1 tablet (1 mg total) by mouth 2 (two) times daily. 30 tablet 11   carbidopa-levodopa (SINEMET IR) 25-100 MG tablet Take 1 tablet by mouth 3 (three) times daily. 8am, 12, 5pm 90 tablet 11   Dicyclomine HCl (BENTYL PO) Take 1 tablet by mouth daily.     FLUoxetine (PROZAC) 40 MG capsule Take 40 mg by mouth daily.     OLANZapine (ZYPREXA) 5 MG tablet Take 1 tablet (5 mg total) by mouth at bedtime. For mood 30 tablet 0   tamsulosin  (FLOMAX) 0.4 MG CAPS capsule Take 0.4 mg by mouth.     vitamin B-12 1000 MCG tablet Take 1 tablet (1,000 mcg total) by mouth daily.     No current facility-administered medications on file prior to visit.    Allergies:   Allergies  Allergen Reactions   Bee Venom Swelling    Swelling at site of sting   Erythromycin Diarrhea      OBJECTIVE:  Physical Exam  Vitals:   02/24/22 1457  BP: 118/78  Pulse: 86  Weight: 120 lb 9.6 oz (54.7 kg)  Height: 5\' 3"  (1.6 m)   Body mass index is 21.36 kg/m. No results found.   General: well developed, well nourished, very pleasant middle-age Caucasian male, seated, in no evident distress Head: head normocephalic and atraumatic.   Neck: supple with no carotid or supraclavicular bruits Cardiovascular: regular rate  and rhythm, no murmurs Musculoskeletal: no deformity Skin:  no rash/petichiae Vascular:  Normal pulses all extremities   Neurologic Exam Mental Status: Awake and fully alert.  Decreased facial expression.  Hypophonia.  Oriented to place and time. Recent and remote memory intact. Attention span, concentration and fund of knowledge appropriate. Mood and affect appropriate.  Cranial Nerves: Pupils equal, briskly reactive to light. Extraocular movements full without nystagmus. Visual fields full to confrontation. Hearing intact. Facial sensation intact. Face, tongue, palate moves normally and symmetrically.  Motor: Normal bulk and tone. Normal strength in all tested extremity muscles; right more than left rigidity and bradykinesia Sensory.: intact to touch , pinprick , position and vibratory sensation.  Coordination: Rapid alternating movements normal in all extremities. Finger-to-nose and heel-to-shin performed accurately bilaterally. Gait and Station: Arises from chair with mild difficulty. Gait demonstrates short shuffling gait, en bloc turning, decreased arm swing Reflexes: 2+ and symmetric. Toes downgoing.         ASSESSMENT/PLAN: CESARIO WEIDINGER is a 56 y.o. year old male  Parkinsonism             Long history of psychiatric medication treatment, secondary versus primary             Sinemet 25/100 mg 3 times daily  Discontinue Cogentin due to potential side effects             Declines interest in PT, encouraged to further consider, he will call if interested in pursuing             Follow up in 3 months or call earlier if needed    CC:  PCP: Benito Mccreedy, MD    I spent 34 minutes of face-to-face and non-face-to-face time with patient.  This included previsit chart review, lab review, study review, order entry, electronic health record documentation, patient education regarding above diagnoses and treatment plan and answered all the questions to patient satisfaction   Frann Rider, [facility]  [facility] [address] [place], [place] [address]  Phone [phone] Fax [phone] Note: This document was prepared with digital dictation and possible smart phrase technology. Any transcriptional errors that result from this process are unintentional.

## 2022-02-24 NOTE — Patient Instructions (Signed)
Please stop Cogentin  at this time  Continue Sinemet 1 tab 3 times daily  Please call if you would like to participate in physical therapy for further help with balance    Follow up in 3 months or call earlier if needed

## 2022-05-31 NOTE — Progress Notes (Unsigned)
[facility] [address] [place]. [place] [address] [phone]       OFFICE FOLLOW UP NOTE  Mr. Gary [name] Date of Birth:  August 29, 1965 Medical Record Number:  [id number]    Primary neurologist: Dr. Krista Blue Reason for visit: Parkinsonism    SUBJECTIVE:   CHIEF COMPLAINT:  No chief complaint on file.   HPI:   Gary [name] is a 57 year old male, seen in request by his primary care physician Dr. Benito Mccreedy, for gait abnormality, initial evaluation was on August 24, 2021 with Dr. Krista Blue   I reviewed and summarized the referring note. PMHX. Bipolar disorder, on Zyprexa and home medications Right hip replacement Hypertension, newly diagnosed, was placed on Norvasc   Consult visit 08/24/2021 Dr. Krista Blue: Patient was brought in by rehab transportation, alone at today's visit, he was able to provide a reasonable medical history  He had lifelong history of mood disorder, was treated with different medications in the past, he can only require Depakote, but not all of the medications name, currently on Zyprexa 5 mg every day, Lexapro 20 mg daily for many years  He used to live with his mother, but had a huge conflict with her, then lived alone over the past 3 to 4 years, suffered a traumatic injury of right hip, had ORIF of right femur fracture, in November 2019 has to right hip replacement due to 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 and retained painful orthopedic implant of deep screws  Postsurgical follow-up in January 2020 showed excellent motion without pain, he lives alone at his apartment,  Reported since fall 2022, he began to notice gradual onset of memory loss, shuffling, tendency to walk faster and faster, sometimes fell face forward, he does not drive, never had a job, he used to take taxi to go to grocery shopping every Friday, began to use [employer],  On July 04, 2021, because increased gait  abnormality, he called 911  I personally reviewed MRI of the brain on July 04, 2021, no acute intracranial abnormality, mild small vessel disease MRI of lumbar, mild degenerative changes, no evidence of canal or foraminal narrowing   X-ray right hip: s/p right total hip arthroplasty.  Laboratory evaluations: Normal CBC, hemoglobin 13.9, BNP, CMP, creatinine 1.17, decreased total protein of 5.9, normal normal magnesium, Uric acid, urine random copper level, serum copper level, ceruloplasmin level, TSH, HIV, methylmalonic acid level,   Since then, he was discharged to outpatient rehab on July 09, 2021,  He denies significant pain, denies sensory loss, no bowel and bladder incontinence.  Update 02/24/2022 Gary [name]: Patient returns for follow-up visit after prior initial visit with Dr. Krista Blue 6 months ago.  Was started on Sinemet and cogentin at prior visit, patient called office beginning of June with concerns of potential medication side effects ("prickly" feeling in head, decreased taste, blurred vision and dry mouth) and was advised to discontinue Cogentin but he has not yet stopped this medication.  He remains on both Cogentin and Sinemet, he continues to experience side effects.  He does believe these medications have been beneficial in regards to his gait and movements.  He does have some imbalance, has had a couple recent falls thankfully without significant injury.   Update 06/01/2022 Gary [name]: Returns for 67-month follow-up.  Remains on Sinemet 1 tab 3 times daily Has since discontinued Cogentin ***          ROS:   14 system review of systems performed  and negative with exception of those listed in HPI  PMH:  Past Medical History:  Diagnosis Date   Anxiety    Bipolar 2 disorder (HCC)    Depression    GERD (gastroesophageal reflux disease)    Hyperhydrosis disorder    Parkinsonism     PSH:  Past Surgical History:  Procedure Laterality Date   HERNIA REPAIR     HIP SURGERY  Right    LEFT HEART CATH AND CORONARY ANGIOGRAPHY N/A 09/19/2018   Procedure: LEFT HEART CATH AND CORONARY ANGIOGRAPHY;  Surgeon: [name], MD;  Location: [facility];  Service: Cardiovascular;  Laterality: N/A;    Social History:  Social History   Socioeconomic History   Marital status: Single    Spouse name: Not on file   Number of children: 0   Years of education: Not on file   Highest education level: Not on file  Occupational History   Not on file  Tobacco Use   Smoking status: Never   Smokeless tobacco: Never  Vaping Use   Vaping Use: Never used  Substance and Sexual Activity   Alcohol use: No   Drug use: No   Sexual activity: Not on file  Other Topics Concern   Not on file  Social History Narrative   02/24/22 Lives alone   Social Determinants of Health   Financial Resource Strain: Not on file  Food Insecurity: Not on file  Transportation Needs: Not on file  Physical Activity: Not on file  Stress: Not on file  Social Connections: Not on file  Intimate Partner Violence: Not on file    Family History: No family history on file.  Medications:   Current Outpatient Medications on File Prior to Visit  Medication Sig Dispense Refill   amLODipine (NORVASC) 10 MG tablet Take 1 tablet (10 mg total) by mouth daily.     carbidopa-levodopa (SINEMET IR) 25-100 MG tablet Take 1 tablet by mouth 3 (three) times daily. 8am, 12, 5pm 90 tablet 11   Dicyclomine HCl (BENTYL PO) Take 1 tablet by mouth daily.     FLUoxetine (PROZAC) 40 MG capsule Take 40 mg by mouth daily.     OLANZapine (ZYPREXA) 5 MG tablet Take 1 tablet (5 mg total) by mouth at bedtime. For mood 30 tablet 0   tamsulosin (FLOMAX) 0.4 MG CAPS capsule Take 0.4 mg by mouth.     vitamin B-12 1000 MCG tablet Take 1 tablet (1,000 mcg total) by mouth daily.     No current facility-administered medications on file prior to visit.    Allergies:   Allergies  Allergen Reactions   Bee Venom Swelling     Swelling at site of sting   Erythromycin Diarrhea      OBJECTIVE:  Physical Exam  There were no vitals filed for this visit.  There is no height or weight on file to calculate BMI. No results found.   General: well developed, well nourished, very pleasant middle-age Caucasian male, seated, in no evident distress Head: head normocephalic and atraumatic.   Neck: supple with no carotid or supraclavicular bruits Cardiovascular: regular rate and rhythm, no murmurs Musculoskeletal: no deformity Skin:  no rash/petichiae Vascular:  Normal pulses all extremities   Neurologic Exam Mental Status: Awake and fully alert.  Decreased facial expression.  Hypophonia.  Oriented to place and time. Recent and remote memory intact. Attention span, concentration and fund of knowledge appropriate. Mood and affect appropriate.  Cranial Nerves: Pupils equal, briskly reactive  to light. Extraocular movements full without nystagmus. Visual fields full to confrontation. Hearing intact. Facial sensation intact. Face, tongue, palate moves normally and symmetrically.  Motor: Normal bulk and tone. Normal strength in all tested extremity muscles; right more than left rigidity and bradykinesia Sensory.: intact to touch , pinprick , position and vibratory sensation.  Coordination: Rapid alternating movements normal in all extremities. Finger-to-nose and heel-to-shin performed accurately bilaterally. Gait and Station: Arises from chair with mild difficulty. Gait demonstrates short shuffling gait, en bloc turning, decreased arm swing Reflexes: 2+ and symmetric. Toes downgoing.        ASSESSMENT/PLAN: ANGELES PAOLUCCI is a 57 y.o. year old male  Parkinsonism             Long history of psychiatric medication treatment, secondary versus primary             Sinemet 25/100 mg 3 times daily  Discontinue Cogentin due to potential side effects             Declines interest in PT, encouraged to further consider, he  will call if interested in pursuing             Follow up in 3 months or call earlier if needed    CC:  PCP: Jackie Plum, MD    I spent 34 minutes of face-to-face and non-face-to-face time with patient.  This included previsit chart review, lab review, study review, order entry, electronic health record documentation, patient education regarding above diagnoses and treatment plan and answered all the questions to patient satisfaction   Ihor Austin, [facility]  [facility] [address] [place], [place] [address]  Phone [phone] Fax [phone] Note: This document was prepared with digital dictation and possible smart phrase technology. Any transcriptional errors that result from this process are unintentional.

## 2022-06-01 ENCOUNTER — Encounter: Payer: Self-pay | Admitting: Adult Health

## 2022-06-01 ENCOUNTER — Ambulatory Visit (INDEPENDENT_AMBULATORY_CARE_PROVIDER_SITE_OTHER): Payer: Medicaid Other | Admitting: Adult Health

## 2022-06-01 VITALS — BP 135/90 | HR 81 | Ht 62.0 in | Wt 119.0 lb

## 2022-06-01 DIAGNOSIS — G20C Parkinsonism, unspecified: Secondary | ICD-10-CM | POA: Diagnosis not present

## 2022-06-01 NOTE — Patient Instructions (Addendum)
Your Plan:  Continue on Sinemet 1 tab 3 times daily  Start trihexyphenidyl as recommended by psychiatry     Follow up in 3 months or call earlier if needed    Thank you for coming to see [place] at [facility]. I hope we have been able to provide you high quality care today.  You may receive a patient satisfaction survey over the next few weeks. We would appreciate your feedback and comments so that we may continue to improve ourselves and the health of our patients.

## 2022-08-23 ENCOUNTER — Encounter: Payer: Self-pay | Admitting: Adult Health

## 2022-08-23 ENCOUNTER — Telehealth: Payer: Self-pay | Admitting: Adult Health

## 2022-08-23 NOTE — Telephone Encounter (Signed)
LVM and sent letter in mail informing pt of need to reschedule 09/08/22 appointment - NP out

## 2022-08-30 ENCOUNTER — Other Ambulatory Visit: Payer: Self-pay

## 2022-08-30 MED ORDER — CARBIDOPA-LEVODOPA 25-100 MG PO TABS: 1.0000 | ORAL_TABLET | Freq: Three times a day (TID) | ORAL | 11 refills | Status: DC

## 2022-09-08 ENCOUNTER — Ambulatory Visit: Payer: Medicaid Other | Admitting: Adult Health

## 2023-09-13 ENCOUNTER — Other Ambulatory Visit: Payer: Self-pay | Admitting: Anesthesiology

## 2023-09-13 MED ORDER — CARBIDOPA-LEVODOPA 25-100 MG PO TABS: 1.0000 | ORAL_TABLET | Freq: Three times a day (TID) | ORAL | 1 refills | Status: DC

## 2023-11-28 ENCOUNTER — Other Ambulatory Visit: Payer: Self-pay | Admitting: Neurology

## 2023-12-27 ENCOUNTER — Other Ambulatory Visit: Payer: Self-pay | Admitting: Neurology

## 2024-03-16 ENCOUNTER — Other Ambulatory Visit: Payer: Self-pay | Admitting: Neurology
